# Patient Record
Sex: Female | Born: 1975 | Race: Black or African American | Hispanic: No | Smoking: Current every day smoker
Health system: Southern US, Community
[De-identification: ages and names within clinical notes are randomized; demographics above are authoritative.]

## PROBLEM LIST (undated history)

## (undated) ENCOUNTER — Ambulatory Visit: Admission: EM | Payer: MEDICAID

## (undated) DIAGNOSIS — K219 Gastro-esophageal reflux disease without esophagitis: Secondary | ICD-10-CM

## (undated) DIAGNOSIS — S060X0A Concussion without loss of consciousness, initial encounter: Secondary | ICD-10-CM

## (undated) DIAGNOSIS — N939 Abnormal uterine and vaginal bleeding, unspecified: Secondary | ICD-10-CM

## (undated) DIAGNOSIS — D3A8 Other benign neuroendocrine tumors: Secondary | ICD-10-CM

## (undated) DIAGNOSIS — F4321 Adjustment disorder with depressed mood: Secondary | ICD-10-CM

## (undated) DIAGNOSIS — L989 Disorder of the skin and subcutaneous tissue, unspecified: Secondary | ICD-10-CM

## (undated) HISTORY — PX: COLONOSCOPY: SHX174

## (undated) HISTORY — PX: HAND SURGERY: SHX662

## (undated) HISTORY — DX: Adjustment disorder with depressed mood: F43.21

## (undated) HISTORY — PX: FOOT SURGERY: SHX648

## (undated) HISTORY — PX: TUBAL LIGATION: SHX77

## (undated) HISTORY — DX: Concussion without loss of consciousness, initial encounter: S06.0X0A

## (undated) HISTORY — DX: Other benign neuroendocrine tumors: D3A.8

## (undated) HISTORY — PX: UPPER GASTROINTESTINAL ENDOSCOPY: SHX188

---

## 1997-07-24 ENCOUNTER — Other Ambulatory Visit: Admission: RE | Admit: 1997-07-24 | Discharge: 1997-07-24 | Payer: Self-pay

## 1999-02-11 ENCOUNTER — Inpatient Hospital Stay (HOSPITAL_COMMUNITY): Admission: AD | Admit: 1999-02-11 | Discharge: 1999-02-11 | Payer: Self-pay | Admitting: *Deleted

## 2001-10-22 ENCOUNTER — Inpatient Hospital Stay (HOSPITAL_COMMUNITY): Admission: AD | Admit: 2001-10-22 | Discharge: 2001-10-22 | Payer: Self-pay | Admitting: Obstetrics and Gynecology

## 2005-12-18 ENCOUNTER — Ambulatory Visit (HOSPITAL_COMMUNITY): Admission: RE | Admit: 2005-12-18 | Discharge: 2005-12-18 | Payer: Self-pay | Admitting: Obstetrics

## 2006-01-13 HISTORY — PX: FOOT SURGERY: SHX648

## 2006-02-14 ENCOUNTER — Inpatient Hospital Stay (HOSPITAL_COMMUNITY): Admission: AD | Admit: 2006-02-14 | Discharge: 2006-02-18 | Payer: Self-pay | Admitting: Obstetrics

## 2006-07-19 ENCOUNTER — Inpatient Hospital Stay (HOSPITAL_COMMUNITY): Admission: AD | Admit: 2006-07-19 | Discharge: 2006-07-21 | Payer: Self-pay | Admitting: Obstetrics

## 2006-07-20 ENCOUNTER — Encounter (INDEPENDENT_AMBULATORY_CARE_PROVIDER_SITE_OTHER): Payer: Self-pay | Admitting: Obstetrics

## 2007-02-21 ENCOUNTER — Emergency Department (HOSPITAL_COMMUNITY): Admission: EM | Admit: 2007-02-21 | Discharge: 2007-02-21 | Payer: Self-pay | Admitting: Family Medicine

## 2008-01-27 ENCOUNTER — Emergency Department (HOSPITAL_COMMUNITY): Admission: EM | Admit: 2008-01-27 | Discharge: 2008-01-28 | Payer: Self-pay | Admitting: Emergency Medicine

## 2009-02-04 ENCOUNTER — Emergency Department (HOSPITAL_COMMUNITY): Admission: EM | Admit: 2009-02-04 | Discharge: 2009-02-04 | Payer: Self-pay | Admitting: Emergency Medicine

## 2009-03-26 ENCOUNTER — Emergency Department (HOSPITAL_COMMUNITY): Admission: EM | Admit: 2009-03-26 | Discharge: 2009-03-26 | Payer: Self-pay | Admitting: Emergency Medicine

## 2010-04-29 LAB — URINALYSIS, ROUTINE W REFLEX MICROSCOPIC
Bilirubin Urine: NEGATIVE
Glucose, UA: NEGATIVE mg/dL
Ketones, ur: NEGATIVE mg/dL
Nitrite: NEGATIVE
Protein, ur: NEGATIVE mg/dL
Urobilinogen, UA: 0.2 mg/dL (ref 0.0–1.0)
pH: 6.5 (ref 5.0–8.0)

## 2010-04-29 LAB — COMPREHENSIVE METABOLIC PANEL
Alkaline Phosphatase: 52 U/L (ref 39–117)
CO2: 24 mEq/L (ref 19–32)
Chloride: 108 mEq/L (ref 96–112)
Creatinine, Ser: 0.51 mg/dL (ref 0.4–1.2)
Glucose, Bld: 114 mg/dL — ABNORMAL HIGH (ref 70–99)
Potassium: 4.2 mEq/L (ref 3.5–5.1)
Total Protein: 6.2 g/dL (ref 6.0–8.3)

## 2010-04-29 LAB — URINE MICROSCOPIC-ADD ON

## 2010-05-01 ENCOUNTER — Other Ambulatory Visit (HOSPITAL_COMMUNITY): Payer: Self-pay | Admitting: Obstetrics

## 2010-05-01 DIAGNOSIS — Z1231 Encounter for screening mammogram for malignant neoplasm of breast: Secondary | ICD-10-CM

## 2010-05-06 ENCOUNTER — Ambulatory Visit (HOSPITAL_COMMUNITY)
Admission: RE | Admit: 2010-05-06 | Discharge: 2010-05-06 | Disposition: A | Discharge status: Home / Self Care | Payer: Medicaid Other | Source: Ambulatory Visit | Attending: Obstetrics | Admitting: Obstetrics

## 2010-05-06 DIAGNOSIS — Z1231 Encounter for screening mammogram for malignant neoplasm of breast: Secondary | ICD-10-CM

## 2010-05-07 ENCOUNTER — Ambulatory Visit (HOSPITAL_COMMUNITY): Payer: Medicaid Other

## 2010-05-28 NOTE — Op Note (Signed)
[name], Vanessa Butler               ACCOUNT NO.:  [id number]   MEDICAL RECORD NO.:  [id number]          PATIENT TYPE:  INP   LOCATION:  9105                          FACILITY:  [facility]   PHYSICIAN:  Kathreen Cosier, M.D.DATE OF BIRTH:  Jul 06, 1975   DATE OF PROCEDURE:  DATE OF DISCHARGE:                               OPERATIVE REPORT   PREOPERATIVE DIAGNOSIS:  Multiparity.   PROCEDURE:  Postpartum tubal ligation.   Under general anesthesia, with the patient in the supine position,  abdomen prepped and draped.  Bladder entered with a straight catheter.  A midline subumbilical incision, a 1 inch line was made, and carried  down to the fascia.  Fascia cleaned, grasped with two Kochers in the  fascia, and the peritoneum opened with the Mayo scissors.  Left tube  grasped in the midportion with a Babcock clamp, and then 0 plain suture  placed in the mesosalpinx below the portion of the tube within the  clamp.  This was then tied.  Approximately 1 inch of tube transected.  Hemostasis satisfactory.  Procedure done in a similar fashion on the  other side.  Lap and sponge counts correct.  Abdomen closed in layers.  Peritoneum and fascia closed with continuous suture of 0 Dexon.  Skin  closed with subcuticular stitch of 4-0 Monocryl.  Blood loss minimal.  The patient tolerated the procedure well and was taken to the recovery  room in good condition.           ______________________________  Kathreen Cosier, M.D.     BAM/MEDQ  D:  07/20/2006  T:  07/20/2006  Job:  [id number]

## 2010-05-31 NOTE — Discharge Summary (Signed)
Vanessa, Butler               ACCOUNT NO.:  [id number]   MEDICAL RECORD NO.:  [id number]          PATIENT TYPE:  INP   LOCATION:  9305                          FACILITY:  [facility]   PHYSICIAN:  Kathreen Cosier, M.D.DATE OF BIRTH:  05/05/1975   DATE OF ADMISSION:  02/14/2006  DATE OF DISCHARGE:  02/18/2006                               DISCHARGE SUMMARY   HOSPITAL COURSE:  The patient is a 35 year old gravida 3, para 1-1-0-2,  [facility] July 31, 2006, who was treated for a urinary tract infection a week  prior to admission, but did not take her medication.  She was admitted  with a temperature of 100.7, right CVA tenderness, positive urine.  She  spiked up to 101.4 and she was on IV ampicillin and gentamicin.  By  February 5, she was afebrile and by February 6, she had been afebrile  for greater than 24 hours.  She was discharged home on February 18, 2006,  status post pyelonephritis.           ______________________________  Kathreen Cosier, M.D.     BAM/MEDQ  D:  03/04/2006  T:  03/04/2006  Job:  [id number]

## 2010-08-08 ENCOUNTER — Emergency Department (HOSPITAL_COMMUNITY)
Admission: EM | Admit: 2010-08-08 | Discharge: 2010-08-08 | Disposition: A | Discharge status: Home / Self Care | Payer: Medicaid Other | Attending: Emergency Medicine | Admitting: Emergency Medicine

## 2010-08-08 DIAGNOSIS — J3489 Other specified disorders of nose and nasal sinuses: Secondary | ICD-10-CM | POA: Insufficient documentation

## 2010-08-08 DIAGNOSIS — R197 Diarrhea, unspecified: Secondary | ICD-10-CM | POA: Insufficient documentation

## 2010-08-08 DIAGNOSIS — J029 Acute pharyngitis, unspecified: Secondary | ICD-10-CM | POA: Insufficient documentation

## 2010-10-29 LAB — CBC
Hemoglobin: 9.9 — ABNORMAL LOW
MCHC: 33.4
MCHC: 34
MCV: 103.6 — ABNORMAL HIGH
MCV: 103.8 — ABNORMAL HIGH
RBC: 2.86 — ABNORMAL LOW
RDW: 12.6
RDW: 12.6
WBC: 6.4
WBC: 9.9

## 2011-04-07 ENCOUNTER — Encounter (HOSPITAL_COMMUNITY): Payer: Self-pay

## 2011-04-07 ENCOUNTER — Emergency Department (INDEPENDENT_AMBULATORY_CARE_PROVIDER_SITE_OTHER)
Admission: EM | Admit: 2011-04-07 | Discharge: 2011-04-07 | Disposition: A | Discharge status: Home / Self Care | Payer: Medicaid Other | Source: Home / Self Care | Attending: Family Medicine | Admitting: Family Medicine

## 2011-04-07 DIAGNOSIS — J329 Chronic sinusitis, unspecified: Secondary | ICD-10-CM

## 2011-04-07 DIAGNOSIS — R51 Headache: Secondary | ICD-10-CM

## 2011-04-07 LAB — GLUCOSE, CAPILLARY: Glucose-Capillary: 86 mg/dL (ref 70–99)

## 2011-04-07 MED ORDER — FEXOFENADINE HCL 60 MG PO TABS: 60.0000 mg | ORAL_TABLET | Freq: Two times a day (BID) | ORAL | Status: DC

## 2011-04-07 MED ORDER — PREDNISONE 20 MG PO TABS: ORAL_TABLET | ORAL | Status: AC

## 2011-04-07 MED ORDER — AMOXICILLIN 500 MG PO CAPS: 500.0000 mg | ORAL_CAPSULE | Freq: Three times a day (TID) | ORAL | Status: AC

## 2011-04-07 MED ORDER — IBUPROFEN 600 MG PO TABS: 600.0000 mg | ORAL_TABLET | Freq: Four times a day (QID) | ORAL | Status: AC | PRN

## 2011-04-07 NOTE — ED Notes (Signed)
C/o 1 month duration of HA w pressure in teeth, worse when she bends over , R>L

## 2011-04-07 NOTE — Discharge Instructions (Signed)
My impression is that likely your headache is triggered by sinusitis. But there can be other reasons for headaches. Take the prescribed medications as instructed. Use nasal saline spray at least 3 times a day. Simply saline is sold over-the-counter. I recommend that you have your eyes checked for possible refraction problems as you may need eyeglasses. Go to the emergency department if worsening pain or new symptoms like problems with her vision, your balance, moving your extremities or vomiting. Otherwise if persistent headaches I recommend that you followup with a neurologist number provided above or with a primary care provider who can continue a further workup.

## 2011-04-09 NOTE — ED Provider Notes (Signed)
History     CSN: [id number]  Arrival date & time 04/07/11  1600   First MD Initiated Contact with Patient 04/07/11 1704      Chief Complaint  Patient presents with  . Headache    (Consider location/radiation/quality/duration/timing/severity/associated sxs/prior treatment) HPI Comments: 36 y/o smoker female here c/o headache periocular associated with photophobia but no nausea or vomiting. No visual changes that she can recognize. No dizziness or balance problems. Headaches recurrent for the last month. Symptoms associated with tooth pressure and she has had extensive dental work up recently. Denies taking antibiotics during dental work. Has had h/o seasonal allergies in the past. Currently nasal congestion and postnasal drip, no sore throat, no cough. Thinks she has a cold because of general malaise. Headache worse with bending over. Denies difficulties with movement, paresthesias, numbness or weakness. No galactorrhea. LMP: 04/04/11. Has PCP at [place].   History reviewed. No pertinent past medical history.  History reviewed. No pertinent past surgical history.  History reviewed. No pertinent family history.  History  Substance Use Topics  . Smoking status: Current Some Day Smoker  . Smokeless tobacco: Not on file  . Alcohol Use: No    OB History    Grav Para Term Preterm Abortions TAB SAB Ect Mult Living                  Review of Systems  Constitutional: Negative for fever, chills and appetite change.  HENT: Positive for congestion, dental problem and sinus pressure. Negative for ear pain, sore throat, trouble swallowing, neck pain and neck stiffness.   Cardiovascular: Negative for chest pain, palpitations and leg swelling.  Gastrointestinal: Negative for nausea, vomiting, abdominal pain and diarrhea.  Genitourinary: Negative for dysuria, frequency and flank pain.  Musculoskeletal: Negative for gait problem.  Skin: Negative for rash.  Neurological: Positive for  headaches. Negative for dizziness, tremors, seizures, syncope, facial asymmetry, speech difficulty, weakness and numbness.  Hematological: Negative for adenopathy.    Allergies  Review of patient's allergies indicates no known allergies.  Home Medications   Current Outpatient Rx  Name Route Sig Dispense Refill  . AMOXICILLIN 500 MG PO CAPS Oral Take 1 capsule (500 mg total) by mouth 3 (three) times daily. 21 capsule 0  . FEXOFENADINE HCL 60 MG PO TABS Oral Take 1 tablet (60 mg total) by mouth 2 (two) times daily. 60 tablet 0  . IBUPROFEN 600 MG PO TABS Oral Take 1 tablet (600 mg total) by mouth every 6 (six) hours as needed for pain. 20 tablet 0  . PREDNISONE 20 MG PO TABS  2 tabs po daily for 5 days 10 tablet no    BP 147/85  Pulse 76  Temp(Src) 98.7 F (37.1 C) (Oral)  Resp 10  SpO2 100%  LMP 04/04/2011  Physical Exam  Nursing note and vitals reviewed. Constitutional: She is oriented to person, place, and time. She appears well-developed and well-nourished. No distress.  HENT:  Head: Normocephalic and atraumatic.  Right Ear: External ear normal.  Left Ear: External ear normal.  Mouth/Throat: Oropharynx is clear and moist.       Nasal Congestion with erythema and swelling of nasal turbinates, clear rhinorrhea. Reported sinus pressure. Mild pharyngeal erythema, no exudates. No uvula deviation. No trismus. TM's normal.  Eyes: Conjunctivae and EOM are normal. Pupils are equal, round, and reactive to light.  Neck: Normal range of motion. Neck supple. No JVD present. No thyromegaly present.  Cardiovascular: Regular rhythm and normal heart sounds.  Pulmonary/Chest: Breath sounds normal.  Abdominal: Soft. There is no tenderness.  Lymphadenopathy:    She has no cervical adenopathy.  Neurological: She is alert and oriented to person, place, and time. She has normal strength and normal reflexes. No cranial nerve deficit or sensory deficit. She displays a negative Romberg sign.  Coordination and gait normal.       Visual fields normal by comparison  Skin: No rash noted.    ED Course  Procedures (including critical care time)   Labs Reviewed  GLUCOSE, CAPILLARY  LAB REPORT - SCANNED   No results found.   1. Rhinosinusitis   2. Headache       MDM  Impress rhino sinusitis. Afebrile. Likely headache related to sinusitis vs migraine although no prior h/o migraines reported. No history or findings suggestive of intracranial expansion process. Treated with prednisone, amoxicillin, allegra and ibuprofen. Asked to follow up with pcp for symptoms monitoring and possible screening for thyroid, anemia etc. Also asked to have vision checked. Neurology referral as needed if persistent symptoms. Asked to go to the ED if worsening or new symptoms. Random CBG normal here.         Sharin Grave, MD 04/09/11 1205

## 2011-05-12 ENCOUNTER — Encounter (HOSPITAL_COMMUNITY): Payer: Self-pay

## 2011-05-12 ENCOUNTER — Emergency Department (HOSPITAL_COMMUNITY)
Admission: EM | Admit: 2011-05-12 | Discharge: 2011-05-12 | Disposition: A | Discharge status: Home / Self Care | Payer: Medicaid Other | Source: Home / Self Care | Attending: Emergency Medicine | Admitting: Emergency Medicine

## 2011-05-12 DIAGNOSIS — L0201 Cutaneous abscess of face: Secondary | ICD-10-CM

## 2011-05-12 DIAGNOSIS — L03211 Cellulitis of face: Secondary | ICD-10-CM

## 2011-05-12 HISTORY — DX: Disorder of the skin and subcutaneous tissue, unspecified: L98.9

## 2011-05-12 MED ORDER — SULFAMETHOXAZOLE-TRIMETHOPRIM 800-160 MG PO TABS: 1.0000 | ORAL_TABLET | Freq: Two times a day (BID) | ORAL | Status: AC

## 2011-05-12 NOTE — Discharge Instructions (Signed)
Return here or follow up with your doctor in 2 days for a wound check. Finish the antibiotics even if you feel better. Continue the warm compresses. Start taking the motrin and 1 gram of tylenol with it as needed for pain. This is an effective combination for pain.t, as they both have tylenol in them and too much can hurt your liver.   Return sooner if you have a fever >100.4, if you get worse, or any other concerns.   Go to www.goodrx.com to look up your medications. This will give you a list of where you can find your prescriptions at the most affordable prices.

## 2011-05-12 NOTE — ED Provider Notes (Signed)
History     CSN: [id number]  Arrival date & time 05/12/11  4098   First MD Initiated Contact with Patient 05/12/11 1003      Chief Complaint  Patient presents with  . Skin Ulcer    (Consider location/radiation/quality/duration/timing/severity/associated sxs/prior treatment) HPI Comments: Patient reports a painful mass gradually increasing size on her right face starting several days ago. States it started off as a small pimple. Patient has been applying alcohol and peroxide, but the skin peeled off. No blisters. No other facial rash. No nausea, vomiting, fevers. No drainage from the area. No dental pain. No other facial swelling.  patient is not diabetic.  ROS as noted in HPI. All other ROS negative.   Patient is a 36 y.o. female presenting with abscess. The history is provided by the patient. No language interpreter was used.  Abscess  This is a new problem. The onset was gradual. The problem has been gradually worsening. The abscess is present on the face. The abscess is characterized by redness, peeling and painfulness. It is unknown what she was exposed to. Pertinent negatives include no fever. There were no sick contacts. She has received no recent medical care.    Past Medical History  Diagnosis Date  . Skin disorder     Past Surgical History  Procedure Date  . Foot surgery   . Tubal ligation     History reviewed. No pertinent family history.  History  Substance Use Topics  . Smoking status: Current Some Day Smoker  . Smokeless tobacco: Not on file  . Alcohol Use: No    OB History    Grav Para Term Preterm Abortions TAB SAB Ect Mult Living                  Review of Systems  Constitutional: Negative for fever.    Allergies  Review of patient's allergies indicates no known allergies.  Home Medications   Current Outpatient Rx  Name Route Sig Dispense Refill  . BACITRACIN 500 UNIT/GM EX OINT Topical Apply 1 application topically 2 (two) times daily.      Marland Kitchen FEXOFENADINE HCL 60 MG PO TABS Oral Take 1 tablet (60 mg total) by mouth 2 (two) times daily. 60 tablet 0    BP 113/73  Pulse 72  Temp(Src) 98.5 F (36.9 C) (Oral)  Resp 12  SpO2 100%  LMP 05/02/2011  Physical Exam  Nursing note and vitals reviewed. Constitutional: She is oriented to person, place, and time. She appears well-developed and well-nourished. No distress.  HENT:  Head: Normocephalic and atraumatic.    Mouth/Throat: Uvula is midline, oropharynx is clear and moist and mucous membranes are normal. Abnormal dentition. Dental caries present. No dental abscesses.       1.0 x 0.5 cm tender area with central fluctuance. No evidence of buccal abscess  Eyes: Conjunctivae and EOM are normal.  Neck: Normal range of motion.  Cardiovascular: Normal rate.   Pulmonary/Chest: Effort normal.  Abdominal: She exhibits no distension.  Musculoskeletal: Normal range of motion.  Neurological: She is alert and oriented to person, place, and time.  Skin: Skin is warm and dry.  Psychiatric: She has a normal mood and affect. Her behavior is normal. Judgment and thought content normal.    ED Course  INCISION AND DRAINAGE Performed by: Luiz Blare Authorized by: Luiz Blare Consent: Verbal consent obtained. Risks and benefits: risks, benefits and alternatives were discussed Consent given by: patient Patient understanding: patient states understanding of  the procedure being performed Patient consent: the patient's understanding of the procedure matches consent given Required items: required blood products, implants, devices, and special equipment available Patient identity confirmed: verbally with patient Time out: Immediately prior to procedure a "time out" was called to verify the correct patient, procedure, equipment, support staff and site/side marked as required. Type: abscess Body area: head/neck Location details: face Patient sedated: no Needle gauge:  18 Incision type: single straight Complexity: simple Drainage: purulent Drainage amount: moderate Wound treatment: wound left open Patient tolerance: Patient tolerated the procedure well with no immediate complications. Comments: Bacitracin and sterile dressing applied   (including critical care time)  Labs Reviewed - No data to display No results found.   1. Facial abscess       MDM  Drain facial abscess. Sending home with Bactrim, warm compresses. Tylenol ibuprofen as needed for pain Advised patient to return here or follow up with Dr. in 2 days if no significant improvement, fevers, or any other concerns. Patient agrees with plan.  Luiz Blare, MD 05/12/11 1324

## 2011-05-21 LAB — PROCEDURE REPORT - SCANNED: Pap: NEGATIVE

## 2012-08-24 ENCOUNTER — Inpatient Hospital Stay (HOSPITAL_COMMUNITY): Payer: Medicaid Other

## 2012-11-11 ENCOUNTER — Telehealth: Payer: Self-pay | Admitting: *Deleted

## 2012-11-11 NOTE — Telephone Encounter (Signed)
Pt complains of severe pain in hands due to warts, hurting since 04/2012, asked for Dr. Ralene Cork to refer to hand specialist.  Pt called previously in 08/2012, and was given [facility], Dr Merlyn Lot and Dr Teressa Senter' s phone numbers.  I gave her the names and phone numbers again, and instructed her to have her primary physician refer.

## 2012-11-23 ENCOUNTER — Other Ambulatory Visit (HOSPITAL_COMMUNITY)
Admission: RE | Admit: 2012-11-23 | Discharge: 2012-11-23 | Disposition: A | Discharge status: Home / Self Care | Payer: Medicaid Other | Source: Ambulatory Visit | Attending: Family Medicine | Admitting: Family Medicine

## 2012-11-23 ENCOUNTER — Other Ambulatory Visit: Payer: Self-pay | Admitting: Family Medicine

## 2012-11-23 DIAGNOSIS — N760 Acute vaginitis: Secondary | ICD-10-CM | POA: Insufficient documentation

## 2012-11-23 DIAGNOSIS — Z113 Encounter for screening for infections with a predominantly sexual mode of transmission: Secondary | ICD-10-CM | POA: Insufficient documentation

## 2013-07-21 ENCOUNTER — Other Ambulatory Visit (HOSPITAL_COMMUNITY): Payer: Self-pay | Admitting: Hematology and Oncology

## 2013-12-24 ENCOUNTER — Emergency Department (HOSPITAL_COMMUNITY)
Admission: EM | Admit: 2013-12-24 | Discharge: 2013-12-24 | Disposition: A | Discharge status: Home / Self Care | Payer: Medicaid Other | Attending: Emergency Medicine | Admitting: Emergency Medicine

## 2013-12-24 ENCOUNTER — Encounter (HOSPITAL_COMMUNITY): Payer: Self-pay | Admitting: *Deleted

## 2013-12-24 DIAGNOSIS — R21 Rash and other nonspecific skin eruption: Secondary | ICD-10-CM | POA: Diagnosis not present

## 2013-12-24 DIAGNOSIS — Z792 Long term (current) use of antibiotics: Secondary | ICD-10-CM | POA: Insufficient documentation

## 2013-12-24 DIAGNOSIS — L989 Disorder of the skin and subcutaneous tissue, unspecified: Secondary | ICD-10-CM | POA: Diagnosis not present

## 2013-12-24 DIAGNOSIS — Z720 Tobacco use: Secondary | ICD-10-CM | POA: Diagnosis not present

## 2013-12-24 MED ORDER — PREDNISONE 20 MG PO TABS: ORAL_TABLET | ORAL | Status: DC

## 2013-12-24 MED ORDER — PREDNISONE 20 MG PO TABS
60.0000 mg | ORAL_TABLET | Freq: Once | ORAL | Status: AC
  Administered 2013-12-24: 60 mg via ORAL
  Filled 2013-12-24: qty 3

## 2013-12-24 MED ORDER — DIPHENHYDRAMINE HCL 25 MG PO CAPS
25.0000 mg | ORAL_CAPSULE | Freq: Four times a day (QID) | ORAL | Status: DC | PRN
  Administered 2013-12-24: 25 mg via ORAL
  Filled 2013-12-24: qty 1

## 2013-12-24 NOTE — Discharge Instructions (Signed)
Return to the emergency room with worsening of symptoms, new symptoms or with symptoms that are concerning, especially fevers, face, neck, lips, mouth swelling. Tenderness or swelling under tongue and of tongue as well as chest pain, shortness of breath, sensation of throat tightness or difficulty breathing. Take prednisone as prescribed as well as Benadryl or Claritin, Zyrtec, Allegra. Benadryl can be sedating sig do not drive or operate machinery drink alcohol or take any other sedating meds while taking Benadryl. Follow-up with your dermatologist as scheduled in 5 days. Use Vaseline to moisturize lesions. Do not scratch.   Contact Dermatitis Contact dermatitis is a reaction to certain substances that touch the skin. Contact dermatitis can be either irritant contact dermatitis or allergic contact dermatitis. Irritant contact dermatitis does not require previous exposure to the substance for a reaction to occur.Allergic contact dermatitis only occurs if you have been exposed to the substance before. Upon a repeat exposure, your body reacts to the substance.  CAUSES  Many substances can cause contact dermatitis. Irritant dermatitis is most commonly caused by repeated exposure to mildly irritating substances, such as:  Makeup.  Soaps.  Detergents.  Bleaches.  Acids.  Metal salts, such as nickel. Allergic contact dermatitis is most commonly caused by exposure to:  Poisonous plants.  Chemicals (deodorants, shampoos).  Jewelry.  Latex.  Neomycin in triple antibiotic cream.  Preservatives in products, including clothing. SYMPTOMS  The area of skin that is exposed may develop:  Dryness or flaking.  Redness.  Cracks.  Itching.  Pain or a burning sensation.  Blisters. With allergic contact dermatitis, there may also be swelling in areas such as the eyelids, mouth, or genitals.  DIAGNOSIS  Your caregiver can usually tell what the problem is by doing a physical exam. In  cases where the cause is uncertain and an allergic contact dermatitis is suspected, a patch skin test may be performed to help determine the cause of your dermatitis. TREATMENT Treatment includes protecting the skin from further contact with the irritating substance by avoiding that substance if possible. Barrier creams, powders, and gloves may be helpful. Your caregiver may also recommend:  Steroid creams or ointments applied 2 times daily. For best results, soak the rash area in cool water for 20 minutes. Then apply the medicine. Cover the area with a plastic wrap. You can store the steroid cream in the refrigerator for a "chilly" effect on your rash. That may decrease itching. Oral steroid medicines may be needed in more severe cases.  Antibiotics or antibacterial ointments if a skin infection is present.  Antihistamine lotion or an antihistamine taken by mouth to ease itching.  Lubricants to keep moisture in your skin.  Burow's solution to reduce redness and soreness or to dry a weeping rash. Mix one packet or tablet of solution in 2 cups cool water. Dip a clean washcloth in the mixture, wring it out a bit, and put it on the affected area. Leave the cloth in place for 30 minutes. Do this as often as possible throughout the day.  Taking several cornstarch or baking soda baths daily if the area is too large to cover with a washcloth. Harsh chemicals, such as alkalis or acids, can cause skin damage that is like a burn. You should flush your skin for 15 to 20 minutes with cold water after such an exposure. You should also seek immediate medical care after exposure. Bandages (dressings), antibiotics, and pain medicine may be needed for severely irritated skin.  HOME CARE INSTRUCTIONS  Avoid the substance that caused your reaction.  Keep the area of skin that is affected away from hot water, soap, sunlight, chemicals, acidic substances, or anything else that would irritate your skin.  Do not  scratch the rash. Scratching may cause the rash to become infected.  You may take cool baths to help stop the itching.  Only take over-the-counter or prescription medicines as directed by your caregiver.  See your caregiver for follow-up care as directed to make sure your skin is healing properly. SEEK MEDICAL CARE IF:   Your condition is not better after 3 days of treatment.  You seem to be getting worse.  You see signs of infection such as swelling, tenderness, redness, soreness, or warmth in the affected area.  You have any problems related to your medicines. Document Released: 12/28/1999 Document Revised: 03/24/2011 Document Reviewed: 06/04/2010 [facility] Patient Information 2015 [place], [place]. This information is not intended to replace advice given to you by your health care provider. Make sure you discuss any questions you have with your health care provider.

## 2013-12-24 NOTE — ED Provider Notes (Signed)
CSN: [id number]     Arrival date & time 12/24/13  7564 History   First MD Initiated Contact with Patient 12/24/13 0759     Chief Complaint  Patient presents with  . Rash     (Consider location/radiation/quality/duration/timing/severity/associated sxs/prior Treatment) HPI  Vanessa Butler is a 38 y.o. 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 Patient presenting with a raised cutaneous rash on left shoulder and is now appeared on right forearm and antecubital fossa scattered lesions on legs. Lesions are pruritic without discharge. Patient denies fevers, chills, nausea, vomiting, abdominal pain, chest pain, shortness of breath, throat tightness or difficulty breathing. No other rashes. She denies any other new medications or detergents or being outside.   Past Medical History  Diagnosis Date  . Skin disorder    Past Surgical History  Procedure Laterality Date  . Foot surgery    . Tubal ligation     History reviewed. No pertinent family history. History  Substance Use Topics  . Smoking status: Current Some Day Smoker  . Smokeless tobacco: Not on file  . Alcohol Use: No   OB History    No data available     Review of Systems  Constitutional: Negative for fever and chills.  Gastrointestinal: Negative for nausea and vomiting.  Skin: Positive for rash.      Allergies  Review of patient's allergies indicates no known allergies.  Home Medications   Prior to Admission medications   Medication Sig Start Date End Date Taking? Authorizing Provider  bacitracin 500 UNIT/GM ointment Apply 1 application topically 2 (two) times daily.    Historical Provider, MD  predniSONE (DELTASONE) 20 MG tablet 2 tabs po daily x 4 days 12/24/13   Pura Spice, PA-C   BP 116/62 mmHg  Pulse 79  Temp(Src) 98.5 F (36.9 C) (Oral)  Resp 16  Ht 5' (1.524 m)  Wt 130 lb (58.968 kg)  BMI 25.39 kg/m2  SpO2 98%   LMP 12/12/2013 Physical Exam  Constitutional: She appears well-developed and well-nourished. No distress.  HENT:  Head: Normocephalic and atraumatic.  No facial swelling or lip swelling. No neck swelling or masses. No trismus or uvula deviation. No oral lesions. No tongue swelling or swelling under the tongue or tenderness.  Eyes: Conjunctivae and EOM are normal. Right eye exhibits no discharge. Left eye exhibits no discharge.  Cardiovascular: Normal rate, regular rhythm and normal heart sounds.   Pulmonary/Chest: Effort normal and breath sounds normal. No respiratory distress. She has no wheezes.  Abdominal: Soft. Bowel sounds are normal. She exhibits no distension. There is no tenderness.  Neurological: She is alert. She exhibits normal muscle tone. Coordination normal.  Skin: Skin is warm and dry. She is not diaphoretic.  Pt with 2-3cm excoriated irregularly circular lesion with mild erythema. Similar lesions in right antecubital fossa and on right upper chest. No drainage and only mild erythema. Lesions do not look infected and do blanch. Patient with cutaneous warts to side of multiple fingers in bilateral hands.  Nursing note and vitals reviewed.   ED Course  Procedures (including critical care time) Labs Review Labs Reviewed - No data to display  Imaging Review No results found.   EKG Interpretation None      MDM   Final diagnoses:  Rash, skin   Patient with diffuse pruritic rash without know cause. Patient is being seen by dermatologist and has been given  square acid. She noted that the rash appeared after she started taking the medicines. No fevers. No difficulty breathing, throat tightness. VSS. No mucous membrane involvement. No oral, tongue, facial swelling. No tenderness under tongue. I doubt Stevens-Johnson syndrome. Tx with steriods, antihistamines and symptomatic treatment. Follow up her dermatologist in 5 days as scheduled.   Discussed return precautions with  patient. Discussed all results and patient verbalizes understanding and agrees with plan.    Pura Spice, PA-C 12/24/13 [place], MD 12/24/13 [phone]

## 2013-12-24 NOTE — ED Notes (Signed)
Pt reports rash started 3 days after usinf a new med for warts . Pt brought med to ED ( ACID in ACETONE 0.4% solution).

## 2013-12-24 NOTE — ED Notes (Signed)
Declined W/C at D/C and was escorted to lobby by RN. 

## 2014-12-13 ENCOUNTER — Telehealth: Payer: Self-pay | Admitting: *Deleted

## 2014-12-13 NOTE — Telephone Encounter (Signed)
Pt states she would like the name of a Copy.  Left message informing pt that Dr. Blenda Mounts liked Dr. Theodis Sato at [phone].

## 2014-12-20 ENCOUNTER — Telehealth: Payer: Self-pay | Admitting: *Deleted

## 2014-12-20 NOTE — Telephone Encounter (Addendum)
Pt states Dr. Rhona Raider office is not answering the phone.  Unable to leave a message on pt's home phone, and the mobile is a disconnected number. I will refer to [facility] group.  I told pt that Raliegh Ip or [place] maybe able to help.

## 2015-02-18 ENCOUNTER — Emergency Department (HOSPITAL_COMMUNITY)
Admission: EM | Admit: 2015-02-18 | Discharge: 2015-02-18 | Disposition: A | Discharge status: Home / Self Care | Payer: Medicaid Other | Attending: Emergency Medicine | Admitting: Emergency Medicine

## 2015-02-18 ENCOUNTER — Encounter (HOSPITAL_COMMUNITY): Payer: Self-pay | Admitting: Physical Medicine and Rehabilitation

## 2015-02-18 DIAGNOSIS — Z87440 Personal history of urinary (tract) infections: Secondary | ICD-10-CM | POA: Insufficient documentation

## 2015-02-18 DIAGNOSIS — Z7952 Long term (current) use of systemic steroids: Secondary | ICD-10-CM | POA: Insufficient documentation

## 2015-02-18 DIAGNOSIS — R300 Dysuria: Secondary | ICD-10-CM | POA: Diagnosis present

## 2015-02-18 DIAGNOSIS — F17210 Nicotine dependence, cigarettes, uncomplicated: Secondary | ICD-10-CM | POA: Diagnosis not present

## 2015-02-18 DIAGNOSIS — Z872 Personal history of diseases of the skin and subcutaneous tissue: Secondary | ICD-10-CM | POA: Diagnosis not present

## 2015-02-18 DIAGNOSIS — N390 Urinary tract infection, site not specified: Secondary | ICD-10-CM | POA: Diagnosis not present

## 2015-02-18 DIAGNOSIS — Z792 Long term (current) use of antibiotics: Secondary | ICD-10-CM | POA: Diagnosis not present

## 2015-02-18 LAB — URINALYSIS, ROUTINE W REFLEX MICROSCOPIC
Bilirubin Urine: NEGATIVE
Glucose, UA: NEGATIVE mg/dL
Ketones, ur: NEGATIVE mg/dL
NITRITE: POSITIVE — AB
PROTEIN: 100 mg/dL — AB
SPECIFIC GRAVITY, URINE: 1.029 (ref 1.005–1.030)
pH: 5.5 (ref 5.0–8.0)

## 2015-02-18 LAB — URINE MICROSCOPIC-ADD ON

## 2015-02-18 MED ORDER — PHENAZOPYRIDINE HCL 200 MG PO TABS: 200.0000 mg | ORAL_TABLET | Freq: Three times a day (TID) | ORAL | Status: DC

## 2015-02-18 MED ORDER — NITROFURANTOIN MONOHYD MACRO 100 MG PO CAPS: 100.0000 mg | ORAL_CAPSULE | Freq: Two times a day (BID) | ORAL | Status: DC

## 2015-02-18 NOTE — ED Provider Notes (Signed)
CSN: [id number]     Arrival date & time 02/18/15  [id number] History  By signing my name below, I, Julien Nordmann, attest that this documentation has been prepared under the direction and in the presence of Kallee Nam, PA-C. Electronically Signed: Julien Nordmann, ED Scribe. 02/18/2015. 11:44 AM.     Chief Complaint  Patient presents with  . Dysuria  . Hematuria      The history is provided by the patient. No language interpreter was used.   HPI Comments: Vanessa Butler is a 40 y.o. female who presents to the Emergency Department complaining of constant, moderate, gradual worsening dysuria with associated hematuria and bladder pressure, urinary frequency and urgency onset one week ago. She reports having a UTI when she was younger and does not have a hx of kidney stones. She has been taking OTC medication to alleviate her symptoms with no relief. Denies fevers, chills, body aches, vaginal discharge, vaginal bleeding, blood in stool, diarrhea, back pain.  Past Medical History  Diagnosis Date  . Skin disorder    Past Surgical History  Procedure Laterality Date  . Foot surgery    . Tubal ligation     No family history on file. Social History  Substance Use Topics  . Smoking status: Current Some Day Smoker    Types: Cigarettes  . Smokeless tobacco: None  . Alcohol Use: No   OB History    No data available     Review of Systems  Constitutional: Negative for fever and chills.  Gastrointestinal: Positive for abdominal pain (lower abdominal pressure). Negative for diarrhea and blood in stool.  Genitourinary: Positive for dysuria, urgency, frequency and hematuria. Negative for vaginal bleeding and vaginal discharge.  Musculoskeletal: Negative for myalgias and back pain.  Allergic/Immunologic: Negative for immunocompromised state.      Allergies  Review of patient's allergies indicates no known allergies.  Home Medications   Prior to Admission medications   Medication Sig Start  Date End Date Taking? Authorizing Provider  bacitracin 500 UNIT/GM ointment Apply 1 application topically 2 (two) times daily.    Historical Provider, MD  predniSONE (DELTASONE) 20 MG tablet 2 tabs po daily x 4 days 12/24/13   Al Corpus, PA-C   Triage vitals: BP 134/75 mmHg  Pulse 90  Temp(Src) 97.8 F (36.6 C) (Oral)  Resp 18  SpO2 98% Physical Exam  Constitutional: She appears well-developed and well-nourished. No distress.  HENT:  Head: Normocephalic and atraumatic.  Neck: Neck supple.  Cardiovascular: Normal rate and regular rhythm.   Pulmonary/Chest: Effort normal and breath sounds normal. No respiratory distress. She has no wheezes. She has no rales.  Abdominal: Soft. She exhibits no distension. There is no tenderness. There is no rebound and no guarding.  No CVA tenderness  Neurological: She is alert.  Skin: She is not diaphoretic.  Psychiatric: She has a normal mood and affect. Her behavior is normal.  Nursing note and vitals reviewed.   ED Course  Procedures  DIAGNOSTIC STUDIES: Oxygen Saturation is 98% on RA, normal by my interpretation.  COORDINATION OF CARE:  11:43 AM Discussed treatment plan with pt at bedside and pt agreed to plan.  Labs Review Labs Reviewed  URINALYSIS, ROUTINE W REFLEX MICROSCOPIC (NOT AT [facility]) - Abnormal; Notable for the following:    Color, Urine ORANGE (*)    APPearance TURBID (*)    Hgb urine dipstick LARGE (*)    Protein, ur 100 (*)    Nitrite POSITIVE (*)  Leukocytes, UA LARGE (*)    All other components within normal limits  URINE MICROSCOPIC-ADD ON - Abnormal; Notable for the following:    Squamous Epithelial / LPF 6-30 (*)    Bacteria, UA MANY (*)    All other components within normal limits    Imaging Review No results found. I have personally reviewed and evaluated these images and lab results as part of my medical decision-making.   EKG Interpretation None      MDM   Final diagnoses:  UTI (lower urinary  tract infection)    Afebrile, nontoxic patient with simple UTI.  Denies other symptoms.   D/C home with macrobid, pyridium.  PCP follow up.  Discussed result, findings, treatment, and follow up  with patient.  Pt given return precautions.  Pt verbalizes understanding and agrees with plan.       Clayton Bibles, PA-C 02/18/15 1239  Merrily Pew, MD 02/18/15 [phone]

## 2015-02-18 NOTE — ED Notes (Signed)
Pt reports dysuria and hematuria. Ongoing x1 week. No relief with OTC medications.

## 2015-02-18 NOTE — Discharge Instructions (Signed)
Read the information below.  Use the prescribed medication as directed.  Please discuss all new medications with your pharmacist.  You may return to the Emergency Department at any time for worsening condition or any new symptoms that concern you.    If you develop high fevers, worsening abdominal pain, uncontrolled vomiting, or are unable to tolerate fluids by mouth, return to the ER for a recheck.     Urinary Tract Infection A urinary tract infection (UTI) can occur any place along the urinary tract. The tract includes the kidneys, ureters, bladder, and urethra. A type of germ called bacteria often causes a UTI. UTIs are often helped with antibiotic medicine.  HOME CARE   If given, take antibiotics as told by your doctor. Finish them even if you start to feel better.  Drink enough fluids to keep your pee (urine) clear or pale yellow.  Avoid tea, drinks with caffeine, and bubbly (carbonated) drinks.  Pee often. Avoid holding your pee in for a long time.  Pee before and after having sex (intercourse).  Wipe from front to back after you poop (bowel movement) if you are a woman. Use each tissue only once. GET HELP RIGHT AWAY IF:   You have back pain.  You have lower belly (abdominal) pain.  You have chills.  You feel sick to your stomach (nauseous).  You throw up (vomit).  Your burning or discomfort with peeing does not go away.  You have a fever.  Your symptoms are not better in 3 days. MAKE SURE YOU:   Understand these instructions.  Will watch your condition.  Will get help right away if you are not doing well or get worse.   This information is not intended to replace advice given to you by your health care provider. Make sure you discuss any questions you have with your health care provider.   Document Released: 06/18/2007 Document Revised: 01/20/2014 Document Reviewed: 07/31/2011 Elsevier Interactive Patient Education [employer].

## 2015-02-18 NOTE — ED Notes (Signed)
Declined W/C at D/C and was escorted to lobby by RN. 

## 2015-05-01 ENCOUNTER — Other Ambulatory Visit (HOSPITAL_COMMUNITY)
Admission: RE | Admit: 2015-05-01 | Discharge: 2015-05-01 | Disposition: A | Discharge status: Home / Self Care | Payer: Medicaid Other | Source: Ambulatory Visit | Attending: Family Medicine | Admitting: Family Medicine

## 2015-05-01 ENCOUNTER — Other Ambulatory Visit: Payer: Self-pay | Admitting: Family Medicine

## 2015-05-01 DIAGNOSIS — Z01419 Encounter for gynecological examination (general) (routine) without abnormal findings: Secondary | ICD-10-CM | POA: Insufficient documentation

## 2015-05-01 DIAGNOSIS — Z1151 Encounter for screening for human papillomavirus (HPV): Secondary | ICD-10-CM | POA: Insufficient documentation

## 2015-05-02 LAB — CYTOLOGY - PAP

## 2015-12-13 ENCOUNTER — Encounter (HOSPITAL_COMMUNITY): Payer: Self-pay | Admitting: *Deleted

## 2015-12-13 ENCOUNTER — Emergency Department (HOSPITAL_COMMUNITY)
Admission: EM | Admit: 2015-12-13 | Discharge: 2015-12-13 | Disposition: A | Discharge status: Home / Self Care | Payer: Medicaid Other | Attending: Emergency Medicine | Admitting: Emergency Medicine

## 2015-12-13 DIAGNOSIS — J029 Acute pharyngitis, unspecified: Secondary | ICD-10-CM

## 2015-12-13 DIAGNOSIS — R200 Anesthesia of skin: Secondary | ICD-10-CM | POA: Diagnosis not present

## 2015-12-13 DIAGNOSIS — Z79899 Other long term (current) drug therapy: Secondary | ICD-10-CM | POA: Diagnosis not present

## 2015-12-13 DIAGNOSIS — F17210 Nicotine dependence, cigarettes, uncomplicated: Secondary | ICD-10-CM | POA: Insufficient documentation

## 2015-12-13 DIAGNOSIS — K5900 Constipation, unspecified: Secondary | ICD-10-CM

## 2015-12-13 LAB — RAPID STREP SCREEN (MED CTR MEBANE ONLY): STREPTOCOCCUS, GROUP A SCREEN (DIRECT): NEGATIVE

## 2015-12-13 NOTE — ED Notes (Signed)
Discharged vitals in and pt getting dressed at this time.

## 2015-12-13 NOTE — ED Notes (Signed)
ED Provider at bedside. 

## 2015-12-13 NOTE — ED Triage Notes (Signed)
Pt presents with multiple complaints.  Initial complaint was that it hurt her tongue, throat and esophagus to swallow  (sharp pain) x 1 week, but that her throat is not sore.  Throat red with white patches.  She then stated constipation x 1 week but that she took a laxative and had a bm this am.  Denies abd pain.  Pt also states R hand numbness for several months, though no pain.

## 2015-12-13 NOTE — ED Provider Notes (Signed)
Vanessa Butler   CSN: [id number] Arrival date & time: 12/13/15  1620     History   Chief Complaint Chief Complaint  Patient presents with  . Sore Throat  . Constipation    HPI Vanessa Butler is a 40 y.o. female.  HPI  40 year old female presents with chief complaint of sore throat and constipation. Has had sore throat for about one week. Hurts in her throat as well as into her neck. Not getting any better or worse. No fevers. A little bit of cough. No shortness of breath. Her tongue feels "raw". No lesions noted. Has tried ibuprofen with no relief. Also the point of constipation. Typically goes once or twice a day but over the last 1 week has only gone a small amount this morning after taking ex-lax. She feels like she needs to go but can't. Still passing gas. No abdominal pain or distention. No nausea or vomiting. Also endorses 2 month history of right hand numbness. States it is her entire front and back hand including all fingers. This has been chronic and constant for over 2 months since she started a new job. No weakness. No other numbness.  Past Medical History:  Diagnosis Date  . Skin disorder     There are no active problems to display for this patient.   Past Surgical History:  Procedure Laterality Date  . FOOT SURGERY    . HAND SURGERY Left   . TUBAL LIGATION      OB History    No data available       Home Medications    Prior to Admission medications   Medication Sig Start Date End Date Taking? Authorizing Provider  bacitracin 500 UNIT/GM ointment Apply 1 application topically 2 (two) times daily.    Historical Provider, MD  nitrofurantoin, macrocrystal-monohydrate, (MACROBID) 100 MG capsule Take 1 capsule (100 mg total) by mouth 2 (two) times daily. 02/18/15   Clayton Bibles, PA-C  phenazopyridine (PYRIDIUM) 200 MG tablet Take 1 tablet (200 mg total) by mouth 3 (three) times daily. 02/18/15   Clayton Bibles, PA-C  predniSONE (DELTASONE) 20 MG  tablet 2 tabs po daily x 4 days 12/24/13   Al Corpus, PA-C    Family History No family history on file.  Social History Social History  Substance Use Topics  . Smoking status: Current Some Day Smoker    Packs/day: 1.00    Types: Cigarettes  . Smokeless tobacco: Never Used  . Alcohol use No     Allergies   Patient has no known allergies.   Review of Systems Review of Systems  Constitutional: Negative for fever.  HENT: Positive for sore throat. Negative for voice change.   Respiratory: Positive for cough.   Gastrointestinal: Negative for abdominal distention, abdominal pain, nausea and vomiting.  Neurological: Positive for numbness. Negative for weakness.  All other systems reviewed and are negative.    Physical Exam Updated Vital Signs BP 129/70 (BP Location: Left Arm)   Pulse 90   Temp 98 F (36.7 C) (Oral)   Resp 24   LMP 12/11/2015   SpO2 100%   Physical Exam  Constitutional: She is oriented to person, place, and time. She appears well-developed and well-nourished. No distress.  HENT:  Head: Normocephalic and atraumatic.  Right Ear: External ear normal.  Left Ear: External ear normal.  Nose: Nose normal.  Mouth/Throat: Oropharynx is clear and moist. No oropharyngeal exudate.  Eyes: Right eye exhibits no discharge. Left eye exhibits no  discharge.  Neck: Normal range of motion. Neck supple. No spinous process tenderness and no muscular tenderness present. Normal range of motion present.  Cardiovascular: Normal rate, regular rhythm and normal heart sounds.   Pulmonary/Chest: Effort normal and breath sounds normal. No stridor. She has no wheezes.  Abdominal: Soft. She exhibits no distension. There is no tenderness.  Lymphadenopathy:    She has no cervical adenopathy.  Neurological: She is alert and oriented to person, place, and time.  Normal gross sensation to hand, normal strength  Skin: Skin is warm and dry. She is not diaphoretic.  Nursing Butler and  vitals reviewed.    ED Treatments / Results  Labs (all labs ordered are listed, but only abnormal results are displayed) Labs Reviewed  RAPID STREP SCREEN (NOT AT [facility])  CULTURE, GROUP A STREP [facility])    EKG  EKG Interpretation None       Radiology No results found.  Procedures Procedures (including critical care time)  Medications Ordered in ED Medications - No data to display   Initial Impression / Assessment and Plan / ED Course  I have reviewed the triage vital signs and the nursing notes.  Pertinent labs & imaging results that were available during my care of the patient were reviewed by me and considered in my medical decision making (see chart for details).  Clinical Course     Patient with multiple variable complaints. Her throat is probably a viral pharyngitis. No neck stiffness, decreased range of motion, or tenderness. No evidence of PTA. No thrush. Overall well appearing. As for her constipation I offered rectal exam in case she has an impaction but she declines. She would rather try enemas at home. She has follow-up with her doctor in about 4 days. Encouraged to keep this. Discussed using Muro lax, stool softeners, etc. at home. Discussed over-the-counter remedies for sore throat. No signs of obvious acute bacterial infection. Highly doubt deep space infection. As for her hand numbness there is no current numbness on my exam and has normal strength. Follow-up with PCP for this chronic issue.  Final Clinical Impressions(s) / ED Diagnoses   Final diagnoses:  Viral pharyngitis  Constipation, unspecified constipation type  Numbness of right hand    New Prescriptions New Prescriptions   No medications on file     Sherwood Gambler, MD 12/13/15 1831

## 2015-12-16 LAB — CULTURE, GROUP A STREP (THRC)

## 2016-02-27 ENCOUNTER — Encounter: Payer: Self-pay | Admitting: Family Medicine

## 2016-02-27 ENCOUNTER — Ambulatory Visit (INDEPENDENT_AMBULATORY_CARE_PROVIDER_SITE_OTHER): Payer: Medicaid Other | Admitting: Family Medicine

## 2016-02-27 VITALS — BP 118/60 | HR 84 | Temp 98.5°F | Resp 16 | Ht 60.0 in | Wt 130.0 lb

## 2016-02-27 DIAGNOSIS — Z114 Encounter for screening for human immunodeficiency virus [HIV]: Secondary | ICD-10-CM | POA: Diagnosis not present

## 2016-02-27 DIAGNOSIS — B079 Viral wart, unspecified: Secondary | ICD-10-CM | POA: Insufficient documentation

## 2016-02-27 DIAGNOSIS — N939 Abnormal uterine and vaginal bleeding, unspecified: Secondary | ICD-10-CM

## 2016-02-27 DIAGNOSIS — Z23 Encounter for immunization: Secondary | ICD-10-CM

## 2016-02-27 DIAGNOSIS — M79642 Pain in left hand: Secondary | ICD-10-CM

## 2016-02-27 DIAGNOSIS — G629 Polyneuropathy, unspecified: Secondary | ICD-10-CM

## 2016-02-27 DIAGNOSIS — F17200 Nicotine dependence, unspecified, uncomplicated: Secondary | ICD-10-CM | POA: Insufficient documentation

## 2016-02-27 DIAGNOSIS — G8929 Other chronic pain: Secondary | ICD-10-CM | POA: Insufficient documentation

## 2016-02-27 LAB — CBC WITH DIFFERENTIAL/PLATELET
Basophils Absolute: 0 cells/uL (ref 0–200)
Basophils Relative: 0 %
EOS PCT: 1 %
Eosinophils Absolute: 34 cells/uL (ref 15–500)
HCT: 37.7 % (ref 35.0–45.0)
HEMOGLOBIN: 12.4 g/dL (ref 11.7–15.5)
LYMPHS ABS: 1224 {cells}/uL (ref 850–3900)
Lymphocytes Relative: 36 %
MCH: 33.1 pg — ABNORMAL HIGH (ref 27.0–33.0)
MCHC: 32.9 g/dL (ref 32.0–36.0)
MCV: 100.5 fL — ABNORMAL HIGH (ref 80.0–100.0)
MONO ABS: 272 {cells}/uL (ref 200–950)
MPV: 9.3 fL (ref 7.5–12.5)
Monocytes Relative: 8 %
NEUTROS ABS: 1870 {cells}/uL (ref 1500–7800)
Neutrophils Relative %: 55 %
Platelets: 329 10*3/uL (ref 140–400)
RBC: 3.75 MIL/uL — AB (ref 3.80–5.10)
RDW: 13.6 % (ref 11.0–15.0)
WBC: 3.4 10*3/uL — AB (ref 3.8–10.8)

## 2016-02-27 LAB — COMPLETE METABOLIC PANEL WITH GFR
ALBUMIN: 4 g/dL (ref 3.6–5.1)
ALK PHOS: 52 U/L (ref 33–115)
ALT: 6 U/L (ref 6–29)
AST: 10 U/L (ref 10–30)
BILIRUBIN TOTAL: 0.3 mg/dL (ref 0.2–1.2)
BUN: 12 mg/dL (ref 7–25)
CO2: 21 mmol/L (ref 20–31)
CREATININE: 0.68 mg/dL (ref 0.50–1.10)
Calcium: 9.1 mg/dL (ref 8.6–10.2)
Chloride: 110 mmol/L (ref 98–110)
GFR, Est African American: >89 mL/min (ref >=60)
GFR, Est Non African American: >89 mL/min (ref >=60)
GLUCOSE: 87 mg/dL (ref 65–99)
Potassium: 4.2 mmol/L (ref 3.5–5.3)
SODIUM: 138 mmol/L (ref 135–146)
TOTAL PROTEIN: 6.7 g/dL (ref 6.1–8.1)

## 2016-02-27 LAB — POCT GLYCOSYLATED HEMOGLOBIN (HGB A1C): Hemoglobin A1C: 5.4

## 2016-02-27 MED ORDER — VARENICLINE TARTRATE 0.5 MG X 11 & 1 MG X 42 PO MISC: ORAL | 0 refills | Status: DC

## 2016-02-27 MED ORDER — GABAPENTIN 300 MG PO CAPS: 300.0000 mg | ORAL_CAPSULE | Freq: Three times a day (TID) | ORAL | 3 refills | Status: DC

## 2016-02-27 NOTE — Patient Instructions (Addendum)
Hand Pain Introduction Many things can cause hand pain. Some common causes are:  An injury.  Repeating the same movement with your hand over and over (overuse).  Osteoporosis.  Arthritis.  Lumps in the tendons or joints of the hand and wrist (ganglion cysts).  Infection. Follow these instructions at home: Pay attention to any changes in your symptoms. Take these actions to help with your discomfort:  If directed, put ice on the affected area:  Put ice in a plastic bag.  Place a towel between your skin and the bag.  Leave the ice on for 15-20 minutes, 3?4 times a day for 2 days.  Take over-the-counter and prescription medicines only as told by your health care provider.  Minimize stress on your hands and wrists as much as possible.  Take breaks from repetitive activity often.  Do stretches as told by your health care provider.  Do not do activities that make your pain worse. Contact a health care provider if:  Your pain does not get better after a few days of self-care.  Your pain gets worse.  Your pain affects your ability to do your daily activities. Get help right away if:  Your hand becomes warm, red, or swollen.  Your hand is numb or tingling.  Your hand is extremely swollen or deformed.  Your hand or fingers turn white or blue.  You cannot move your hand, wrist, or fingers. This information is not intended to replace advice given to you by your health care provider. Make sure you discuss any questions you have with your health care provider. Document Released: 01/26/2015 Document Revised: 06/07/2015 Document Reviewed: 01/25/2014  2017 Elsevier  Human Papillomavirus Human papillomavirus (HPV) is the most common sexually transmitted infection (STI). It is easy to pass it from person to person (contagious). HPV can cause cervical cancer, anal cancer, and genital warts. The genital warts can be seen and felt. Also, there may be wartlike regions in the  throat. HPV may not have any symptoms. It is possible to have HPV for a long time and not know it. You may pass HPV on to others without knowing it. Follow these instructions at home:  Take medicines as told by your doctor.  Use over-the-counter creams for itching as told by your doctor.  Keep all follow-up visits. Make sure to get Pap tests as told by your doctor.  Do not touch or scratch the warts.  Do not treat genital warts with medicines used for treating hand warts.  Do not have sex while you are getting treatment.  Do not douche or use tampons during treatment of HPV.  Tell your sex partner about your infection because he or she may also need treatment.  If you get pregnant, tell your doctor that you had HPV. Your doctor will watch your pregnancy closely. This is important to keep your baby safe.  After treatment, use condoms during sex to prevent future infections.  Have only one sex partner.  Have a sex partner who does not have other sex partners. Contact a doctor if:  The treated skin is red, swollen, or painful.  You have a fever.  You feel ill.  You feel lumps or pimple-like areas in and around your genital area.  You have bleeding of the vagina or the area that was treated.  You have pain during sex. This information is not intended to replace advice given to you by your health care provider. Make sure you discuss any questions you have  with your health care provider. Document Released: 12/13/2007 Document Revised: 06/07/2015 Document Reviewed: 04/06/2013 Elsevier Interactive Patient Education  2017 [employer].  Warts Introduction Warts are small growths on the skin. They are common, and they are caused by a type of germ (virus). Warts can occur on many areas of the body. A person may have one wart or more than one wart. Warts can spread if you scratch a wart and then scratch normal skin. Most warts will go away over many months to a couple years.  Treatments may be done if needed. Follow these instructions at home:  Apply over-the-counter and prescription medicines only as told by your doctor.  Do not apply over-the-counter wart medicines to your face or genitals before you ask your doctor if it is okay to do that.  Do not scratch or pick at a wart.  Wash your hands after you touch a wart.  Avoid shaving hair that is over a wart.  Keep all follow-up visits as told by your doctor. This is important. Contact a doctor if:  Your warts do not improve after treatment.  You have redness, swelling, or pain at the site of a wart.  You have bleeding from a wart, and the bleeding does not stop when you put light pressure on the wart.  You have diabetes and you get a wart. This information is not intended to replace advice given to you by your health care provider. Make sure you discuss any questions you have with your health care provider. Document Released: 05/02/2010 Document Revised: 06/07/2015 Document Reviewed: 03/27/2014  2017 Elsevier

## 2016-02-27 NOTE — Progress Notes (Signed)
Subjective:    Patient ID: Vanessa Butler, female    DOB: 27-Jun-1975, 41 y.o.   MRN: [id number]  HPI [name], a 41 year old female presents accompainied by husband to establish care. She was a patient of Dr. Lucianne Butler, but has been lost to follow up for 1 year. 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 She says that she has been under the care of skin specialists for the problem. She says that she continues to have warts and her left hand has become painful. She states that left hand pain is interfering with activities of daily living. Current pain intensity is 9/10 described as burning and tingling. She states that her left hand feels like "its on fire" at times. She has been taking Ibuprofen 800 mg as prescribed without sustained relief.  She is also complaining of spotting between menstrual cycles. She was a patient of Dr. Gracy Butler for gynecology, who has retired. She says that she was treated for a "bleeding cervix" with antibiotics by her previous provider. Will request medical records for further review. She says that she has 3 sons and has previously had normal menstrual cycles.   She also has a history of chronic tobacco abuse. She says that she has been smoking cigarettes since age 42. She is not ready to quit.   Review of Systems  Constitutional: Negative.  Negative for fever and unexpected weight change.  HENT: Negative.   Eyes: Negative.  Negative for visual disturbance.  Respiratory: Negative.   Cardiovascular: Negative.  Negative for chest pain, palpitations and leg swelling.  Gastrointestinal: Negative.   Endocrine: Negative.  Negative for polydipsia, polyphagia and polyuria.  Genitourinary: Positive for menstrual problem and vaginal bleeding.       Abnormal vaginal bleeding and spotting between menses  Musculoskeletal: Negative.   Skin: Negative.        Viral warts to left hand and left foot  Allergic/Immunologic: Negative.    Neurological: Negative.   Hematological: Negative.   Psychiatric/Behavioral: Negative.         Objective:   Physical Exam  Constitutional: She is oriented to person, place, and time.  HENT:  Head: Normocephalic and atraumatic.  Right Ear: External ear normal.  Left Ear: External ear normal.  Mouth/Throat: Oropharynx is clear and moist.  Eyes: Conjunctivae and EOM are normal. Pupils are equal, round, and reactive to light.  Neck: Normal range of motion. Neck supple.  Pulmonary/Chest: Effort normal and breath sounds normal.  Abdominal: Soft.  Musculoskeletal: Normal range of motion.  Neurological: She is alert and oriented to person, place, and time. She has normal reflexes.  Skin: Skin is warm and dry.  Left hand warts and left foot warts. Hyperpigmented discoloration, round, raised, hard, and tender to palpation.   Psychiatric: She has a normal mood and affect. Her behavior is normal. Judgment and thought content normal.         BP 118/60 (BP Location: Right Arm, Patient Position: Sitting, Cuff Size: Normal)   Pulse 84   Temp 98.5 F (36.9 C) (Oral)   Resp 16   Ht 5' (1.524 m)   Wt 130 lb (59 kg)   LMP 02/01/2016   SpO2 100%   BMI 25.39 kg/m  Assessment & Plan:   1. Viral warts, unspecified type Patient warrants a referral to a dermatologist for further work up and evaluation.  - Ambulatory referral to Dermatology  2. Abnormal vaginal bleeding  -  Ambulatory referral to Gynecology  3. Tobacco dependence Discussed smoking cessation at length. Recommend that patient start Chantix.  - varenicline (CHANTIX STARTING MONTH PAK) 0.5 MG X 11 & 1 MG X 42 tablet; Take one 0.5 mg tablet by mouth once daily for 3 days, then increase to one 0.5 mg tablet twice daily for 4 days, then increase to one 1 mg tablet twice daily.  Dispense: 53 tablet; Refill: 0  4. Chronic hand pain, left Continue NSaids at current dosage. Will also start a trial of gabapentin. Will follow up in 1  month..   5. Neuropathy (HCC) - gabapentin (NEURONTIN) 300 MG capsule; Take 1 capsule (300 mg total) by mouth 3 (three) times daily.  Dispense: 90 capsule; Refill: 3 - HgB A1c - CBC with Differential - COMPLETE METABOLIC PANEL WITH GFR  6. Screening for HIV (human immunodeficiency virus) - HIV antibody (with reflex)  7. Immunization due - Pneumococcal polysaccharide vaccine 23-valent greater than or equal to 2yo subcutaneous/IM  RTC: 1 month for neuropathy   [name], FNP  Medical records request.

## 2016-02-28 LAB — HIV ANTIBODY (ROUTINE TESTING W REFLEX): HIV: NONREACTIVE

## 2016-03-26 ENCOUNTER — Ambulatory Visit (INDEPENDENT_AMBULATORY_CARE_PROVIDER_SITE_OTHER): Payer: Medicaid Other | Admitting: Family Medicine

## 2016-03-26 ENCOUNTER — Encounter: Payer: Self-pay | Admitting: Family Medicine

## 2016-03-26 VITALS — BP 126/71 | HR 90 | Temp 98.0°F | Resp 14 | Ht 60.0 in | Wt 130.0 lb

## 2016-03-26 DIAGNOSIS — G629 Polyneuropathy, unspecified: Secondary | ICD-10-CM

## 2016-03-26 DIAGNOSIS — M7989 Other specified soft tissue disorders: Secondary | ICD-10-CM | POA: Diagnosis not present

## 2016-03-26 DIAGNOSIS — B079 Viral wart, unspecified: Secondary | ICD-10-CM

## 2016-03-26 DIAGNOSIS — F17200 Nicotine dependence, unspecified, uncomplicated: Secondary | ICD-10-CM | POA: Diagnosis not present

## 2016-03-26 MED ORDER — PREGABALIN 50 MG PO CAPS: 50.0000 mg | ORAL_CAPSULE | Freq: Two times a day (BID) | ORAL | 0 refills | Status: DC

## 2016-03-26 MED ORDER — KETOROLAC TROMETHAMINE 60 MG/2ML IM SOLN
30.0000 mg | Freq: Once | INTRAMUSCULAR | Status: AC
  Administered 2016-03-26: 30 mg via INTRAMUSCULAR

## 2016-03-26 NOTE — Patient Instructions (Addendum)
Warts Warts are small growths on the skin. They are common, and they are caused by a type of germ (virus). Warts can occur on many areas of the body. A person may have one wart or more than one wart. Warts can spread if you scratch a wart and then scratch normal skin. Most warts will go away over many months to a couple years. Treatments may be done if needed. Follow these instructions at home:  Apply over-the-counter and prescription medicines only as told by your doctor.  Do not apply over-the-counter wart medicines to your face or genitals before you ask your doctor if it is okay to do that.  Do not scratch or pick at a wart.  Wash your hands after you touch a wart.  Avoid shaving hair that is over a wart.  Keep all follow-up visits as told by your doctor. This is important. Contact a doctor if:  Your warts do not improve after treatment.  You have redness, swelling, or pain at the site of a wart.  You have bleeding from a wart, and the bleeding does not stop when you put light pressure on the wart.  You have diabetes and you get a wart. This information is not intended to replace advice given to you by your health care provider. Make sure you discuss any questions you have with your health care provider. Document Released: 05/02/2010 Document Revised: 06/07/2015 Document Reviewed: 03/27/2014 Elsevier Interactive Patient Education  2017 [employer].

## 2016-03-26 NOTE — Progress Notes (Signed)
Subjective:    Patient ID: Vanessa Butler, female    DOB: 1975/05/16, 41 y.o.   MRN: [id number]  HPI 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 She was started on Gabapentin 1 month ago for neuropath primarily to right hand without success.  Satient has a history of viral warts to left hand and left foot. She says that she has been under the care of skin specialists for the problem. She says that she continues to have warts and her left hand has become painful. She states that left hand pain is interfering with activities of daily living. Current pain intensity is 9/10 described as burning and tingling. She states that her left hand feels like "its on fire" at times. Past Medical History:  Diagnosis Date  . Skin disorder    Immunization History  Administered Date(s) Administered  . Pneumococcal Polysaccharide-23 02/27/2016  . Tdap 02/27/2016   Review of Systems  Constitutional: Negative.  Negative for fever and unexpected weight change.  HENT: Negative.   Eyes: Negative.  Negative for visual disturbance.  Respiratory: Negative.   Cardiovascular: Negative.  Negative for chest pain, palpitations and leg swelling.  Gastrointestinal: Negative.   Endocrine: Negative.  Negative for polydipsia, polyphagia and polyuria.  Musculoskeletal: Negative.        Pain to right 3rd finger  Skin: Negative.        Viral warts to left hand and left foot  Allergic/Immunologic: Negative.   Neurological: Negative.   Hematological: Negative.   Psychiatric/Behavioral: Negative.         Objective:   Physical Exam  Constitutional: She is oriented to person, place, and time.  HENT:  Head: Normocephalic and atraumatic.  Right Ear: External ear normal.  Left Ear: External ear normal.  Mouth/Throat: Oropharynx is clear and moist.  Eyes: Conjunctivae and EOM are normal. Pupils are equal, round, and reactive to light.  Neck: Normal range  of motion. Neck supple.  Pulmonary/Chest: Effort normal and breath sounds normal.  Abdominal: Soft.  Musculoskeletal: Normal range of motion.  Neurological: She is alert and oriented to person, place, and time. She has normal reflexes.  Skin: Skin is warm and dry.  Left hand warts and left foot warts. Hyperpigmented discoloration, round, raised, hard, and tender to palpation. Swelling to right 3rd finger, warm to touch.   Psychiatric: She has a normal mood and affect. Her behavior is normal. Judgment and thought content normal.       BP 126/71 (BP Location: Left Arm, Patient Position: Sitting, Cuff Size: Normal)   Pulse 90   Temp 98 F (36.7 C) (Oral)   Resp 14   Ht 5' (1.524 m)   Wt 130 lb (59 kg)   LMP 03/23/2016   SpO2 100%   BMI 25.39 kg/m  Assessment & Plan:   1. Neuropathy ([place]) Will start a trial of Lyrica for neuropathy.  - pregabalin (LYRICA) 50 MG capsule; Take 1 capsule (50 mg total) by mouth 2 (two) times daily.  Dispense: 60 capsule; Refill: 0  2. Swelling of left middle finger - ketorolac (TORADOL) injection 30 mg; Inject 1 mL (30 mg total) into the muscle once.  3. Viral warts, unspecified type Sent referral to Dermatology on last month, appointment is scheduled for 04/03/2016.   4. Tobacco dependence Smoking cessation instruction/counseling given:  counseled patient on the dangers of tobacco use, advised patient to stop smoking, and reviewed strategies  to maximize success   [name],  FNP                                                                                                                                                                                                                                                                                                                                                                                                   Medical records request.

## 2016-04-03 DIAGNOSIS — D485 Neoplasm of uncertain behavior of skin: Secondary | ICD-10-CM | POA: Diagnosis not present

## 2016-04-10 ENCOUNTER — Telehealth: Payer: Self-pay

## 2016-04-14 ENCOUNTER — Other Ambulatory Visit: Payer: Self-pay | Admitting: Family Medicine

## 2016-04-14 DIAGNOSIS — G629 Polyneuropathy, unspecified: Secondary | ICD-10-CM

## 2016-04-14 MED ORDER — AMITRIPTYLINE HCL 75 MG PO TABS
75.0000 mg | ORAL_TABLET | Freq: Every day | ORAL | 0 refills | Status: DC
Start: 2016-04-14 — End: 2016-05-23

## 2016-04-14 NOTE — Progress Notes (Signed)
Meds ordered this encounter  Medications  . amitriptyline (ELAVIL) 75 MG tablet    Sig: Take 1 tablet (75 mg total) by mouth at bedtime.    Dispense:  30 tablet    Refill:  0    Will start a trial of Amitryptiline for neuropathy/chronic hand pain. Do not drink alcohol, drive, or operate machinery, while taking medication.  Follow up by phone in 1 month.     Donia Pounds  MSN, FNP-C [facility] [address] [place], [place] [address] [phone]

## 2016-04-17 ENCOUNTER — Other Ambulatory Visit: Payer: Self-pay | Admitting: Family Medicine

## 2016-05-02 ENCOUNTER — Encounter: Payer: Medicaid Other | Admitting: Obstetrics and Gynecology

## 2016-05-16 ENCOUNTER — Telehealth: Payer: Self-pay | Admitting: Family Medicine

## 2016-05-16 ENCOUNTER — Telehealth: Payer: Self-pay

## 2016-05-16 NOTE — Telephone Encounter (Signed)
Patient states that the Amitriptyline is working but makes her gain weight. Patient would like to have something else sent into the [facility] on [address].

## 2016-05-16 NOTE — Telephone Encounter (Signed)
Notified patient to discuss medication regimen, phone is not set up to receive messages. I do not recommend changing medication since it is effective for nerve pain. She has tried numerous medications for this problem in the past. I recommend a low fat, low carbohydrate diet divided over 5-6 small meals and starting an exercise regimen 4-5 days per week.   Donia Pounds  MSN, FNP-C [facility] [address] [place], [place] [address] [phone]

## 2016-05-19 NOTE — Telephone Encounter (Signed)
Called, no answer. Voicemail was not set up.

## 2016-05-23 ENCOUNTER — Encounter: Payer: Self-pay | Admitting: Family Medicine

## 2016-05-23 ENCOUNTER — Ambulatory Visit (INDEPENDENT_AMBULATORY_CARE_PROVIDER_SITE_OTHER): Payer: Medicaid Other | Admitting: Family Medicine

## 2016-05-23 VITALS — BP 126/67 | HR 96 | Temp 98.4°F | Resp 16 | Ht 60.0 in | Wt 139.0 lb

## 2016-05-23 DIAGNOSIS — G629 Polyneuropathy, unspecified: Secondary | ICD-10-CM

## 2016-05-23 DIAGNOSIS — F17200 Nicotine dependence, unspecified, uncomplicated: Secondary | ICD-10-CM

## 2016-05-23 DIAGNOSIS — R635 Abnormal weight gain: Secondary | ICD-10-CM | POA: Diagnosis not present

## 2016-05-23 DIAGNOSIS — B079 Viral wart, unspecified: Secondary | ICD-10-CM | POA: Diagnosis not present

## 2016-05-23 LAB — COMPLETE METABOLIC PANEL WITH GFR
ALT: 13 U/L (ref 6–29)
AST: 14 U/L (ref 10–30)
Albumin: 4.1 g/dL (ref 3.6–5.1)
Alkaline Phosphatase: 61 U/L (ref 33–115)
BILIRUBIN TOTAL: 0.2 mg/dL (ref 0.2–1.2)
BUN: 17 mg/dL (ref 7–25)
CALCIUM: 9.2 mg/dL (ref 8.6–10.2)
CO2: 21 mmol/L (ref 20–31)
Chloride: 107 mmol/L (ref 98–110)
Creat: 0.75 mg/dL (ref 0.50–1.10)
GFR, Est African American: >89 mL/min (ref >=60)
GLUCOSE: 81 mg/dL (ref 65–99)
Potassium: 4.6 mmol/L (ref 3.5–5.3)
SODIUM: 136 mmol/L (ref 135–146)
TOTAL PROTEIN: 7.2 g/dL (ref 6.1–8.1)

## 2016-05-23 LAB — POCT URINALYSIS DIP (DEVICE)
Bilirubin Urine: NEGATIVE
Glucose, UA: NEGATIVE mg/dL
Ketones, ur: NEGATIVE mg/dL
Leukocytes, UA: NEGATIVE
Nitrite: NEGATIVE
Protein, ur: NEGATIVE mg/dL
Specific Gravity, Urine: 1.025 (ref 1.005–1.030)
Urobilinogen, UA: 0.2 mg/dL (ref 0.0–1.0)
pH: 5.5 (ref 5.0–8.0)

## 2016-05-23 LAB — TSH: TSH: 0.99 mIU/L

## 2016-05-23 LAB — POCT GLYCOSYLATED HEMOGLOBIN (HGB A1C): HEMOGLOBIN A1C: 5.6

## 2016-05-23 MED ORDER — AMITRIPTYLINE HCL 75 MG PO TABS: 75.0000 mg | ORAL_TABLET | Freq: Every day | ORAL | 1 refills | Status: DC

## 2016-05-23 NOTE — Progress Notes (Signed)
Subjective:    Patient ID: Lucy Antigua, female    DOB: Dec 21, 1975, 41 y.o.   MRN: [id number]  HPI Ms. Vaani Morren, a 41 year old female with a history of verucca vulgaris of hands and feet and peripheral neuropathy presents for a follow up. She was started on Amitriptyline  1 month ago for neuropathy primarily to right hand with moderate relief. She was previously prescribed gabapentin, but discontinued due to unsustained relief.  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 She says that Amitriptyline has been effective in decreasing nerve pain.   She says that she has been under the care of dermatology for the problem. She continues to have warts and her left hand has become painful. She states that left hand pain is interfering with activities of daily living. Current pain intensity is 5/10 described as burning and tingling. She states that her left hand feels like "its on fire" at times. She was recently referred to dermatology and has an appointment on 05/26/2016.  Past Medical History:  Diagnosis Date  . Skin disorder    Immunization History  Administered Date(s) Administered  . Pneumococcal Polysaccharide-23 02/27/2016  . Tdap 02/27/2016   Review of Systems  Constitutional: Negative.  Negative for fever and unexpected weight change.  HENT: Negative.   Eyes: Negative.  Negative for visual disturbance.  Respiratory: Negative.   Cardiovascular: Negative.  Negative for chest pain, palpitations and leg swelling.  Gastrointestinal: Negative.   Endocrine: Negative.  Negative for polydipsia, polyphagia and polyuria.  Musculoskeletal: Negative.        Pain to right 3rd finger  Skin: Negative.        Viral warts to left hand and left foot  Allergic/Immunologic: Negative.   Neurological: Negative.   Hematological: Negative.   Psychiatric/Behavioral: Negative.         Objective:   Physical Exam  Constitutional: She is oriented to person, place, and time.   HENT:  Head: Normocephalic and atraumatic.  Right Ear: External ear normal.  Left Ear: External ear normal.  Mouth/Throat: Oropharynx is clear and moist.  Eyes: Conjunctivae and EOM are normal. Pupils are equal, round, and reactive to light.  Neck: Normal range of motion. Neck supple.  Pulmonary/Chest: Effort normal and breath sounds normal.  Abdominal: Soft.  Musculoskeletal: Normal range of motion.  Neurological: She is alert and oriented to person, place, and time. She has normal reflexes.  Skin: Skin is warm and dry.  Left hand warts and left foot warts. Hyperpigmented discoloration, round, raised, hard, and tender to palpation. Swelling to right 3rd finger, warm to touch.   Psychiatric: She has a normal mood and affect. Her behavior is normal. Judgment and thought content normal.   c    BP 126/67 (BP Location: Right Arm, Patient Position: Sitting, Cuff Size: Normal)   Pulse 96   Temp 98.4 F (36.9 C) (Oral)   Resp 16   Ht 5' (1.524 m)   Wt 139 lb (63 kg)   LMP 04/19/2016   SpO2 100%   BMI 27.15 kg/m  Assessment & Plan:  1. Neuropathy - amitriptyline (ELAVIL) 75 MG tablet; Take 1 tablet (75 mg total) by mouth at bedtime.  Dispense: 30 tablet; Refill: 1  2. Weight gain Recommend a lowfat, low carbohydrate diet divided over 5-6 small meals, increase water intake to 6-8 glasses, and 150 minutes per week of cardiovascular exercise.   - TSH - COMPLETE METABOLIC PANEL WITH GFR -  HgB A1c  3. Viral warts, unspecified type -Follow up in dermatology as scheduled  4. Tobacco dependence Smoking cessation instruction/counseling given:  counseled patient on the dangers of tobacco use, advised patient to stop smoking, and reviewed strategies to maximize success   RTC: 1 month for pap smear   Donia Pounds  MSN, FNP-C [place], [place]  [id number] [phone]                                                                                                                                                                                                                                                                                                                                                                                                    Medical records request.

## 2016-05-23 NOTE — Patient Instructions (Addendum)
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   Will follow up by phone with laboratory results  Recommend a lowfat, low carbohydrate diet divided over 5-6 small meals, increase water intake to 6-8 glasses, and 150 minutes per week of cardiovascular exercise.    Smoking Tobacco Information Smoking tobacco will very likely harm your health. Tobacco contains a poisonous (toxic), colorless chemical called nicotine. Nicotine affects the brain and makes tobacco addictive. This change in your brain can make it hard to stop smoking. Tobacco also has other toxic chemicals that can hurt your body and raise your risk of many cancers. How can smoking tobacco affect me? Smoking tobacco can increase your chances of having serious health conditions, such as:  Cancer. Smoking is most commonly associated with lung cancer, but can lead to cancer in other parts of the body.  Chronic obstructive pulmonary disease (COPD). This is a long-term lung condition that makes it hard to breathe. It also gets worse over time.  High blood pressure (hypertension), heart disease, stroke, or heart attack.  Lung infections, such as pneumonia.  Cataracts. This is when the lenses in the eyes become clouded.  Digestive problems. This may include peptic ulcers, heartburn, and gastroesophageal reflux disease (GERD).  Oral health problems, such as gum disease and tooth loss.  Loss of taste and smell. Smoking can affect your appearance by causing:  Wrinkles.  Yellow or stained teeth, fingers, and fingernails. Smoking tobacco can also affect your social life.  Many workplaces, [employer], hotels, and public places are tobacco-free. This means that you may experience challenges in finding places to smoke when away from home.  The cost of a smoking habit can be expensive. Expenses for someone who smokes come in two ways:  You spend money on a regular basis to buy tobacco.  Your  health care costs in the long-term are higher if you smoke.  Tobacco smoke can also affect the health of those around you. Children of smokers have greater chances of:  Sudden infant death syndrome (SIDS).  Ear infections.  Lung infections. What lifestyle changes can be made?  Do not start smoking. Quit if you already do.  To quit smoking:  Make a plan to quit smoking and commit yourself to it. Look for programs to help you and ask your health care provider for recommendations and ideas.  Talk with your health care provider about using nicotine replacement medicines to help you quit. Medicine replacement medicines include gum, lozenges, patches, sprays, or pills.  Do not replace cigarette smoking with electronic cigarettes, which are commonly called e-cigarettes. The safety of e-cigarettes is not known, and some may contain harmful chemicals.  Avoid places, people, or situations that tempt you to smoke.  If you try to quit but return to smoking, don't give up hope. It is very common for people to try a number of times before they fully succeed. When you feel ready again, give it another try.  Quitting smoking might affect the way you eat as well as your weight. Be prepared to monitor your eating habits. Get support in planning and following a healthy diet.  Ask your health care provider about having regular tests (screenings) to check for cancer. This may include blood tests, imaging tests, and other tests.  Exercise regularly. Consider taking walks, joining a gym, or doing yoga or exercise classes.  Develop skills to manage your stress. These skills include meditation. What are the benefits of quitting smoking?  By quitting smoking, you may:  Lower your risk of getting cancer and other diseases caused by smoking.  Live longer.  Breathe better.  Lower your blood pressure and heart rate.  Stop your addiction to tobacco.  Stop creating secondhand smoke that hurts other  people.  Improve your sense of taste and smell.  Look better over time, due to having fewer wrinkles and less staining. What can happen if changes are not made? If you do not stop smoking, you may:  Get cancer and other diseases.  Develop COPD or other long-term (chronic) lung conditions.  Develop serious problems with your heart and blood vessels (cardiovascular system).  Need more tests to screen for problems caused by smoking.  Have higher, long-term healthcare costs from medicines or treatments related to smoking.  Continue to have worsening changes in your lungs, mouth, and nose. Where to find support: To get support to quit smoking, consider:  Asking your health care provider for more information and resources.  Taking classes to learn more about quitting smoking.  Looking for local organizations that offer resources about quitting smoking.  Joining a support group for people who want to quit smoking in your local community. Where to find more information: You may find more information about quitting smoking from:  HelpGuide.org: www.helpguide.org/articles/addictions/how-to-quit-smoking.htm  https://hall.com/: smokefree.gov  American Lung Association: www.lung.org Contact a health care provider if:  You have problems breathing.  Your lips, nose, or fingers turn blue.  You have chest pain.  You are coughing up blood.  You feel faint or you pass out.  You have other noticeable changes that cause you to worry. Summary  Smoking tobacco can negatively affect your health, the health of those around you, your finances, and your social life.  Do not start smoking. Quit if you already do. If you need help quitting, ask your health care provider.  Think about joining a support group for people who want to quit smoking in your local community. There are many effective programs that will help you to quit this behavior. This information is not intended to replace advice  given to you by your health care provider. Make sure you discuss any questions you have with your health care provider. Document Released: 01/15/2016 Document Revised: 01/15/2016 Document Reviewed: 01/15/2016 Elsevier Interactive Patient Education  2017 [place].  Tobacco Use Disorder Tobacco use disorder (TUD) is a mental disorder. It is the long-term use of tobacco in spite of related health problems or difficulty with normal life activities. Tobacco is most commonly smoked as cigarettes and less commonly as cigars or pipes. Smokeless chewing tobacco and snuff are also popular. People with TUD get a feeling of extreme pleasure (euphoria) from using tobacco and have a desire to use it again and again. Repeated use of tobacco can cause problems. The addictive effects of tobacco are due mainly tothe ingredient nicotine. Nicotine also causes a rush of adrenaline (epinephrine) in the body. This leads to increased blood pressure, heart rate, and breathing rate. These changes may cause problems for people with high blood pressure, weak hearts, or lung disease. High doses of nicotine in children and pets can lead to seizures and death. Tobacco contains a number of other unsafe chemicals. These chemicals are especially harmful when inhaled as smoke and can damage almost every organ in the body. Smokers live shorter lives than nonsmokers and are at risk of dying from a number of diseases and cancers. Tobacco smoke can also cause health problems for nonsmokers (due  to inhaling secondhand smoke). Smoking is also a fire hazard. TUD usually starts in the late teenage years and is most common in young adults between the ages of 37 and 36 years. People who start smoking earlier in life are more likely to continue smoking as adults. TUD is somewhat more common in men than women. People with TUD are at higher risk for using alcohol and other drugs of abuse. What increases the risk? Risk factors for TUD  include:  Having family members with the disorder.  Being around people who use tobacco.  Having an existing mental health issue such as schizophrenia, depression, bipolar disorder, ADHD, or posttraumatic stress disorder (PTSD). What are the signs or symptoms? People with tobacco use disorder have two or more of the following signs and symptoms within 12 months:  Use of more tobacco over a longer period than intended.  Not able to cut down or control tobacco use.  A lot of time spent obtaining or using tobacco.  Strong desire or urge to use tobacco (craving). Cravings may last for 6 months or longer after quitting.  Use of tobacco even when use leads to major problems at work, school, or home.  Use of tobacco even when use leads to relationship problems.  Giving up or cutting down on important life activities because of tobacco use.  Repeatedly using tobacco in situations where it puts you or others in physical danger, like smoking in bed.  Use of tobacco even when it is known that a physical or mental problem is likely related to tobacco use.  Physical problems are numerous and may include chronic bronchitis, emphysema, lung and other cancers, gum disease, high blood pressure, heart disease, and stroke.  Mental problems caused by tobacco may include difficulty sleeping and anxiety.  Need to use greater amounts of tobacco to get the same effect. This means you have developed a tolerance.  Withdrawal symptoms as a result of stopping or rapidly cutting back use. These symptoms may last a month or more after quitting and include the following:  Depressed, anxious, or irritable mood.  Difficulty concentrating.  Increased appetite.  Restlessness or trouble sleeping.  Use of tobacco to avoid withdrawal symptoms. How is this diagnosed? Tobacco use disorder is diagnosed by your health care provider. A diagnosis may be made by:  Your health care provider asking questions about  your tobacco use and any problems it may be causing.  A physical exam.  Lab tests.  You may be referred to a mental health professional or addiction specialist. The severity of tobacco use disorder depends on the number of signs and symptoms you have:  Mild-Two or three symptoms.  Moderate-Four or five symptoms.  Severe-Six or more symptoms. How is this treated? Many people with tobacco use disorder are unable to quit on their own and need help. Treatment options include the following:  Nicotine replacement therapy (NRT). NRT provides nicotine without the other harmful chemicals in tobacco. NRT gradually lowers the dosage of nicotine in the body and reduces withdrawal symptoms. NRT is available in over-the-counter forms (gum, lozenges, and skin patches) as well as prescription forms (mouth inhaler and nasal spray).  Medicines.This may include:  Antidepressant medicine that may reduce nicotine cravings.  A medicine that acts on nicotine receptors in the brain to reduce cravings and withdrawal symptoms. It may also block the effects of tobacco in people with TUD who relapse.  Counseling or talk therapy. A form of talk therapy called behavioral therapy is  commonly used to treat people with TUD. Behavioral therapy looks at triggers for tobacco use, how to avoid them, and how to cope with cravings. It is most effective in person or by phone but is also available in self-help forms (books and Internet websites).  Support groups. These provide emotional support, advice, and guidance for quitting tobacco. The most effective treatment for TUD is usually a combination of medicine, talk therapy, and support groups. Follow these instructions at home:  Keep all follow-up visits as directed by your health care provider. This is important.  Take medicines only as directed by your health care provider.  Check with your health care provider before starting new prescription or over-the-counter  medicines. Contact a health care provider if:  You are not able to take your medicines as prescribed.  Treatment is not helping your TUD and your symptoms get worse. Get help right away if:  You have serious thoughts about hurting yourself or others.  You have trouble breathing, chest pain, sudden weakness, or sudden numbness in part of your body. This information is not intended to replace advice given to you by your health care provider. Make sure you discuss any questions you have with your health care provider. Document Released: 09/05/2003 Document Revised: 09/02/2015 Document Reviewed: 02/25/2013 Elsevier Interactive Patient Education  2017 [place] and Cholesterol Restricted Diet Getting too much fat and cholesterol in your diet may cause health problems. Following this diet helps keep your fat and cholesterol at normal levels. This can keep you from getting sick. What types of fat should I choose?  Choose monosaturated and polyunsaturated fats. These are found in foods such as olive oil, canola oil, flaxseeds, walnuts, almonds, and seeds.  Eat more omega-3 fats. Good choices include salmon, mackerel, sardines, tuna, flaxseed oil, and ground flaxseeds.  Limit saturated fats. These are in animal products such as meats, butter, and cream. They can also be in plant products such as palm oil, palm kernel oil, and coconut oil.  Avoid foods with partially hydrogenated oils in them. These contain trans fats. Examples of foods that have trans fats are stick margarine, some tub margarines, cookies, crackers, and other baked goods. What general guidelines do I need to follow?  Check food labels. Look for the words "trans fat" and "saturated fat."  When preparing a meal:  Fill half of your plate with vegetables and green salads.  Fill one fourth of your plate with whole grains. Look for the word "whole" as the first word in the ingredient list.  Fill one fourth of your  plate with lean protein foods.  Eat more foods that have fiber, like apples, carrots, beans, peas, and barley.  Eat more home-cooked foods. Eat less at restaurants and buffets.  Limit or avoid alcohol.  Limit foods high in starch and sugar.  Limit fried foods.  Cook foods without frying them. Baking, boiling, grilling, and broiling are all great options.  Lose weight if you are overweight. Losing even a small amount of weight can help your overall health. It can also help prevent diseases such as diabetes and heart disease. What foods can I eat? Grains  Whole grains, such as whole wheat or whole grain breads, crackers, cereals, and pasta. Unsweetened oatmeal, bulgur, barley, quinoa, or brown rice. Corn or whole wheat flour tortillas. Vegetables  Fresh or frozen vegetables (raw, steamed, roasted, or grilled). Green salads. Fruits  All fresh, canned (in natural juice), or frozen fruits. Meat and Other Protein Products  Ground beef (85% or leaner), grass-fed beef, or beef trimmed of fat. Skinless chicken or [place]. Ground chicken or [place]. Pork trimmed of fat. All fish and seafood. Eggs. Dried beans, peas, or lentils. Unsalted nuts or seeds. Unsalted canned or dry beans. Dairy  Low-fat dairy products, such as skim or 1% milk, 2% or reduced-fat cheeses, low-fat ricotta or cottage cheese, or plain low-fat yogurt. Fats and Oils  Tub margarines without trans fats. Light or reduced-fat mayonnaise and salad dressings. Avocado. Olive, canola, sesame, or safflower oils. Natural peanut or almond butter (choose ones without added sugar and oil). The items listed above may not be a complete list of recommended foods or beverages. Contact your dietitian for more options.  What foods are not recommended? Grains  White bread. White pasta. White rice. Cornbread. Bagels, pastries, and croissants. Crackers that contain trans fat. Vegetables  White potatoes. Corn. Creamed or fried vegetables. Vegetables  in a cheese sauce. Fruits  Dried fruits. Canned fruit in light or heavy syrup. Fruit juice. Meat and Other Protein Products  Fatty cuts of meat. Ribs, chicken wings, bacon, sausage, bologna, salami, chitterlings, fatback, hot dogs, bratwurst, and packaged luncheon meats. Liver and organ meats. Dairy  Whole or 2% milk, cream, half-and-half, and cream cheese. Whole milk cheeses. Whole-fat or sweetened yogurt. Full-fat cheeses. Nondairy creamers and whipped toppings. Processed cheese, cheese spreads, or cheese curds. Sweets and Desserts  Corn syrup, sugars, honey, and molasses. Candy. Jam and jelly. Syrup. Sweetened cereals. Cookies, pies, cakes, donuts, muffins, and ice cream. Fats and Oils  Butter, stick margarine, lard, shortening, ghee, or bacon fat. Coconut, palm kernel, or palm oils. Beverages  Alcohol. Sweetened drinks (such as sodas, lemonade, and fruit drinks or punches). The items listed above may not be a complete list of foods and beverages to avoid. Contact your dietitian for more information.  This information is not intended to replace advice given to you by your health care provider. Make sure you discuss any questions you have with your health care provider. Document Released: 07/01/2011 Document Revised: 09/06/2015 Document Reviewed: 03/31/2013 Elsevier Interactive Patient Education  2017 [employer].

## 2016-06-20 ENCOUNTER — Encounter: Payer: Self-pay | Admitting: Family Medicine

## 2016-06-20 ENCOUNTER — Other Ambulatory Visit (HOSPITAL_COMMUNITY)
Admission: RE | Admit: 2016-06-20 | Discharge: 2016-06-20 | Disposition: A | Discharge status: Home / Self Care | Payer: Medicaid Other | Source: Ambulatory Visit | Attending: Family Medicine | Admitting: Family Medicine

## 2016-06-20 ENCOUNTER — Ambulatory Visit (INDEPENDENT_AMBULATORY_CARE_PROVIDER_SITE_OTHER): Payer: Medicaid Other | Admitting: Family Medicine

## 2016-06-20 VITALS — BP 131/73 | HR 113 | Temp 99.0°F | Resp 16 | Ht 60.0 in | Wt 142.0 lb

## 2016-06-20 DIAGNOSIS — G8929 Other chronic pain: Secondary | ICD-10-CM | POA: Diagnosis not present

## 2016-06-20 DIAGNOSIS — Z1231 Encounter for screening mammogram for malignant neoplasm of breast: Secondary | ICD-10-CM | POA: Diagnosis not present

## 2016-06-20 DIAGNOSIS — G629 Polyneuropathy, unspecified: Secondary | ICD-10-CM

## 2016-06-20 DIAGNOSIS — Z01419 Encounter for gynecological examination (general) (routine) without abnormal findings: Secondary | ICD-10-CM

## 2016-06-20 DIAGNOSIS — F17200 Nicotine dependence, unspecified, uncomplicated: Secondary | ICD-10-CM | POA: Diagnosis not present

## 2016-06-20 DIAGNOSIS — M79642 Pain in left hand: Secondary | ICD-10-CM | POA: Diagnosis not present

## 2016-06-20 DIAGNOSIS — Z1239 Encounter for other screening for malignant neoplasm of breast: Secondary | ICD-10-CM

## 2016-06-20 LAB — POCT URINALYSIS DIP (DEVICE)
BILIRUBIN URINE: NEGATIVE
GLUCOSE, UA: NEGATIVE mg/dL
Hgb urine dipstick: NEGATIVE
KETONES UR: NEGATIVE mg/dL
Leukocytes, UA: NEGATIVE
Nitrite: NEGATIVE
Protein, ur: NEGATIVE mg/dL
SPECIFIC GRAVITY, URINE: 1.015 (ref 1.005–1.030)
Urobilinogen, UA: 0.2 mg/dL (ref 0.0–1.0)
pH: 5 (ref 5.0–8.0)

## 2016-06-20 MED ORDER — B & B CARPAL TUNNEL BRACE MISC: 1.0000 | Freq: Every day | 0 refills | Status: DC

## 2016-06-20 MED ORDER — AMITRIPTYLINE HCL 100 MG PO TABS: 100.0000 mg | ORAL_TABLET | Freq: Every day | ORAL | 1 refills | Status: DC

## 2016-06-20 MED ORDER — GABAPENTIN 300 MG PO CAPS: 300.0000 mg | ORAL_CAPSULE | Freq: Three times a day (TID) | ORAL | 1 refills | Status: DC

## 2016-06-20 NOTE — Progress Notes (Signed)
Subjective:    Patient ID: Vanessa Butler, female    DOB: 05-17-1975, 41 y.o.   MRN: [id number]   Vanessa Butler, a 41 year old female with a history of verucca vulgaris of hands and feet and peripheral neuropathy presents for a routine gynecological exam exam. She was started on Amitriptyline  1 month ago for neuropathy primarily to right hand with moderate relief. S Patient has a history of viral warts to left hand and left foot. She says that Amitriptyline has been effective in decreasing nerve pain.   She says that she has been under the care of dermatology for the problem. She continues to have warts and her left hand has become painful. She states that left hand pain is interfering with activities of daily living. Current pain intensity is 5/10 described as burning and tingling. She states that her left hand feels like "its on fire" at times. She was recently referred to dermatology and evaluated on 05/26/2016. She was prescribed urea 20%, she has been applying cream nightly under gloves.    Gynecologic Exam  The patient's pertinent negatives include no genital itching, genital lesions, genital odor, genital rash, missed menses, pelvic pain, vaginal bleeding or vaginal discharge. She is not pregnant. Pertinent negatives include no abdominal pain, anorexia, back pain, chills, diarrhea, discolored urine, fever, frequency, headaches, hematuria, joint pain, joint swelling, nausea, painful intercourse, rash, sore throat, urgency or vomiting. She is sexually active. Her menstrual history has been regular. There is no history of a Cesarean section or an STD.   Past Medical History:  Diagnosis Date  . Skin disorder    Immunization History  Administered Date(s) Administered  . Pneumococcal Polysaccharide-23 02/27/2016  . Tdap 02/27/2016   Review of Systems  Constitutional: Negative.  Negative for chills, fever and unexpected weight change.  HENT: Negative.  Negative for sore throat.    Eyes: Negative.  Negative for visual disturbance.  Respiratory: Negative.   Cardiovascular: Negative.  Negative for chest pain, palpitations and leg swelling.  Gastrointestinal: Negative.  Negative for abdominal pain, anorexia, diarrhea, nausea and vomiting.  Endocrine: Negative.  Negative for polydipsia, polyphagia and polyuria.  Genitourinary: Negative for frequency, hematuria, missed menses, pelvic pain, urgency and vaginal discharge.  Musculoskeletal: Negative.  Negative for back pain and joint pain.       Pain to right 3rd finger  Skin: Negative.  Negative for rash.       Viral warts to left hand and left foot  Allergic/Immunologic: Negative.   Neurological: Negative.  Negative for headaches.  Hematological: Negative.   Psychiatric/Behavioral: Negative.         Objective:   Physical Exam  Constitutional: She is oriented to person, place, and time.  HENT:  Head: Normocephalic and atraumatic.  Right Ear: External ear normal.  Left Ear: External ear normal.  Mouth/Throat: Oropharynx is clear and moist.  Eyes: Conjunctivae and EOM are normal. Pupils are equal, round, and reactive to light.  Neck: Normal range of motion. Neck supple.  Pulmonary/Chest: Effort normal and breath sounds normal.  Abdominal: Soft.  Genitourinary: Vagina normal and uterus normal. Cervix exhibits no motion tenderness, no discharge and no friability. Right adnexum displays no tenderness. Left adnexum displays no tenderness. No erythema or tenderness in the vagina. No signs of injury around the vagina. No vaginal discharge found.  Musculoskeletal: Normal range of motion.  Neurological: She is alert and oriented to person, place, and time. She has normal reflexes.  Skin: Skin is warm  and dry.  Left hand warts and left foot warts. Hyperpigmented discoloration, round, raised, hard, and tender to palpation. Swelling to right 3rd finger, warm to touch.   Psychiatric: She has a normal mood and affect. Her  behavior is normal. Judgment and thought content normal.      BP 131/73 (BP Location: Left Arm, Patient Position: Sitting, Cuff Size: Normal)   Pulse (!) 113   Temp 99 F (37.2 C) (Oral)   Resp 16   Ht 5' (1.524 m)   Wt 142 lb (64.4 kg)   LMP 05/18/2016   SpO2 100%   BMI 27.73 kg/m  Assessment & Plan:  1. Pap smear, as part of routine gynecological examination - Cytology - PAP [place] - POCT urinalysis dip (device)  2. Chronic hand pain, left - gabapentin (NEURONTIN) 300 MG capsule; Take 1 capsule (300 mg total) by mouth 3 (three) times daily.  Dispense: 90 capsule; Refill: 1 - Elastic Bandages & Supports (B & B CARPAL TUNNEL BRACE) MISC; 1 each by Does not apply route daily.  Dispense: 1 each; Refill: 0  3. Neuropathy - amitriptyline (ELAVIL) 100 MG tablet; Take 1 tablet (100 mg total) by mouth at bedtime.  Dispense: 30 tablet; Refill: 1  4. Breast cancer screening - MM Digital Screening; Future  5. Tobacco dependence Smoking cessation instruction/counseling given:  counseled patient on the dangers of tobacco use, advised patient to stop smoking, and reviewed strategies to maximize success  RTC: 1 month for chronic hand pain  Donia Pounds  MSN, FNP-C [address] [place], [place]  [phone]  Medical records request.

## 2016-06-20 NOTE — Patient Instructions (Addendum)
Will continue Amitriptyline 100 mg at bedtime Will re-start gabapentin 300 mg three times per day for nerve pain. Refrain from drinking alcohol, driving,or operating machinery while taking this medication.   Apply carpal tunnel brace to left wrist as needed   Cancer Screening for Women A cancer screening is a test or exam that checks for cancer. Your health care provider will recommend specific cancer screenings based on your age, personal history, and family history of cancer. Work with your health care provider to create a cancer screening schedule that protects your health. Why is cancer screening done? Cancer screening is done to look for cancer in the very early stages, before it spreads and becomes harder to treat and before you would start to notice symptoms. Finding cancer early improves the chances of successful treatment. It may save your life. Who should be screened for cancer? All women should be screened for certain cancers, including breast cancer, cervical cancer, and skin cancer. Your health care provider may recommend screenings for other types of cancer if:  You had cancer before.  You have a family member with cancer.  You have abnormal genes that could increase the risk of cancer.  You have risk factors for certain cancers, such as smoking.  When you should be screened for cancer depends on:  Your age.  Your medical history and your family's medical history.  Certain lifestyle factors, such as smoking.  Environmental exposure, such as to asbestos.  What are some common cancer screenings? Breast cancer Breast cancer screening is done with a test that takes images of breast tissue (mammogram). Here are some screening guidelines:  When you are age 35-44. you will be given the choice to start having mammograms.  When you are age 90-54, you should have a mammogram every year.  You may start having mammograms before age 8 if you have risk factors for breast  cancer, such as having an immediate family member with breast cancer.  When you are age 50 or older, you should have a mammogram every 1-2 years for as long as you are in good health and have a life expectancy of 10 years or more.  It is important to know what your breasts look and feel like so you can report any changes to your health care provider.  Cervical cancer Cervical cancer screening is done with a Pap test. This testchecks for abnormalities, including the virus that causes cervical cancer (human papillomavirus, or HPV). To perform the test, a health care provider takes a swab of cervical cells during a pelvic exam. Screening for cervical cancer with a Pap test should start at age 82. Here are some screening guidelines:  When you are age 72-29, you should have a Pap test every 3 years.  When you are age 46-65, you should have a Pap test and HPV test every 5 years or have a Pap test every 3 years.  You may be screened for cervical cancer more often if you have risk factors for cervical cancer.  If your Pap tests are abnormal, you may have an HPV test.  If you have had the HPV vaccine, you will still be screened for cervical cancer and follow normal screening recommendations.  You do not need to be screened for cervical cancer if any of the following apply to you:  You are older than age 40 and you have not had a serious cervical precancer or cancer in the last 20 years.  Your cervix and uterus have been  removed and you have never had cervical cancer or precancerous cells.  Endometrial cancer There is no standard screening test for endometrial cancer, but the cancer can be detected with:  A test of a sample of tissue taken from the lining of the uterus (endometrial tissue biopsy).  A vaginal ultrasound.  Pap tests.  If you are at increased risk for endometrial cancer, you may need to have these tests more often than normal. You are at increased risk if:  You have a family  history of ovarian, uterine, or colon cancer.  You are taking tamoxifen, a drug that is used to treat breast cancer.  You have certain types of colon cancer.  If you have reached menopause, it is especially important to talk with your health care provider about any vaginal bleeding or spotting. Screening for endometrial cancer is not recommended for women who do not have symptoms of the cancer, such as vaginal bleeding. Colorectal cancer Screening for colorectal cancer is recommended starting at age 20 for most women. If you have a family history of colon or rectal cancer or other risk factors, you may need to start having screenings earlier. Talk with your health care provider about which screening test is right for you and how often you should be screened. Colorectal cancer screening looks for cancer or for growths called polyps that often form before cancer starts. Tests to look for cancer or polyps include:  Colonoscopy or flexible sigmoidoscopy. For these procedures, a flexible tube with a small camera is inserted into the rectum.  CT colonography. This test uses X-rays and a contrast dye to check the colon for polyps. If a polyp is found, you may need to have a colonoscopy so the polyp can be located and removed.  Tests to look for cancer in the stool (feces) include:  Guaiac-based fecal occult blood test (FOBT). This test detects blood in stool. It can be done at home with a kit.  Fecal immunochemical test (FIT). This test detects blood in stool. For this test, you will need to collect stool samples at home.  Stool DNA test. This test looks for blood in stool and any changes in DNA that can lead to colon cancer. For this test, you will need to collect a stool sample at home and send it to a lab.  Skin cancer Skin cancer screening is done by checking the skin for unusual moles or spots and any changes in existing moles. Your health care provider should check your skin for signs of skin  cancer at every physical exam. You should check your skin every month and tell your health care provider right away if anything looks unusual. Women with a higher-than-normal risk for skin cancer may want to see a skin specialist (dermatologist) for an annual body check. Lung cancer Lung cancer screening is done with a CT scan that looks for abnormal cells in the lungs. Discuss lung cancer screening with your health care provider if you are 104-45 years old and if any of the following apply to you:  You currently smoke.  You used to smoke heavily.  You have had at least a 30-pack-year smoking history.  You have quit smoking within the past 15 years.  If you smoke heavily or if you used to smoke, you may need to be screened every year. Where to find more information:  [place]: SkinPromotion.no  Centers for Disease Control and Prevention: http://knight-sullivan.biz/  Department of Health and Human Services: BankingDetective.si Contact a  health care provider if:  You have concerns about any signs or symptoms of cancer, such as: ? Moles that have an unusual shape or color. ? Changes in existing moles. ? A sore on your skin that does not heal. ? Blood in your stool. ? Fatigue that does not go away. ? Frequent pain or cramping in your abdomen. ? Coughing, or coughing up blood. ? Losing weight without trying. ? Lumps or other changes in your breasts. ? Vaginal bleeding, spotting, or changes in your periods. Summary  Be aware of and watch for signs and symptoms of cancer, especially symptoms of breast cancer, cervical cancer, endometrial cancer, colorectal cancer, skin cancer, and lung cancer.  Early detection of cancer with cancer screening may save your life.  Talk with your health care provider about your specific cancer risks.  Work together  with your health care provider to create a cancer screening plan that is right for you. This information is not intended to replace advice given to you by your health care provider. Make sure you discuss any questions you have with your health care provider. Document Released: 09/27/2015 Document Revised: 09/27/2015 Document Reviewed: 09/27/2015 Elsevier Interactive Patient Education  2018 [employer]. Pap Test Why am I having this test? A pap test is sometimes called a pap smear. It is a screening test that is used to check for signs of cancer of the vagina, cervix, and uterus. The test can also identify the presence of infection or precancerous changes. Your health care provider will likely recommend you have this test done on a regular basis. This test may be done:  Every 3 years, starting at age 24.  Every 5 years, in combination with testing for the presence of human papillomavirus (HPV).  More or less often depending on other medical conditions.  What kind of sample is taken? Using a small cotton swab, plastic spatula, or brush, your health care provider will collect a sample of cells from the surface of your cervix. Your cervix is the opening to your uterus, also called a womb. Secretions from the cervix and vagina may also be collected. How do I prepare for this test?  Be aware of where you are in your menstrual cycle. You may be asked to reschedule the test if you are menstruating on the day of the test.  You may need to reschedule if you have a known vaginal infection on the day of the test.  You may be asked to avoid douching or taking a bath the day before or the day of the test.  Some medicines can cause abnormal test results, such as digitalis and tetracycline. Talk with your health care provider before your test if you take one of these medicines. What do the results mean? Abnormal test results may indicate a number of health conditions. These may include:  Cancer.  Although pap test results cannot be used to diagnose cancer of the cervix, vagina, or uterus, they may suggest the possibility of cancer. Further tests would be required to determine if cancer is present.  Sexually transmitted disease.  Fungal infection.  Parasite infection.  Herpes infection.  A condition causing or contributing to infertility.  It is your responsibility to obtain your test results. Ask the lab or department performing the test when and how you will get your results. Contact your health care provider to discuss any questions you have about your results. Talk with your health care provider to discuss your results, treatment options, and if  necessary, the need for more tests. Talk with your health care provider if you have any questions about your results. This information is not intended to replace advice given to you by your health care provider. Make sure you discuss any questions you have with your health care provider. Document Released: 03/22/2002 Document Revised: 09/05/2015 Document Reviewed: 05/23/2013 Elsevier Interactive Patient Education  [employer].

## 2016-06-24 LAB — CYTOLOGY - PAP
Bacterial vaginitis: POSITIVE — AB
Candida vaginitis: NEGATIVE
Chlamydia: NEGATIVE
DIAGNOSIS: NEGATIVE
Neisseria Gonorrhea: NEGATIVE

## 2016-06-26 ENCOUNTER — Other Ambulatory Visit: Payer: Self-pay | Admitting: Family Medicine

## 2016-06-26 ENCOUNTER — Telehealth: Payer: Self-pay

## 2016-06-26 DIAGNOSIS — A5901 Trichomonal vulvovaginitis: Secondary | ICD-10-CM

## 2016-06-26 DIAGNOSIS — B9689 Other specified bacterial agents as the cause of diseases classified elsewhere: Secondary | ICD-10-CM

## 2016-06-26 DIAGNOSIS — N760 Acute vaginitis: Secondary | ICD-10-CM

## 2016-06-26 MED ORDER — METRONIDAZOLE 500 MG PO TABS: 500.0000 mg | ORAL_TABLET | Freq: Two times a day (BID) | ORAL | 0 refills | Status: DC

## 2016-06-26 NOTE — Progress Notes (Signed)
Meds ordered this encounter  Medications  . metroNIDAZOLE (FLAGYL) 500 MG tablet    Sig: Take 1 tablet (500 mg total) by mouth 2 (two) times daily.    Dispense:  14 tablet    Refill:  0     Donia Pounds  MSN, FNP-C [facility] [address] [place], [place] [address] [phone]

## 2016-06-26 NOTE — Telephone Encounter (Signed)
-----   Message from [name], [place] sent at 06/26/2016  8:46 AM EDT ----- Regarding: lab resutls Please inform Ms. Monier that vaginal sample yielded trichomonas vaginitis and bacterial vaginitis. 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 Will treat with Metronidazole 500 mg every 12 hours for 7 days. Refrain from alcohol use while taking this medication. Also recommend barrier protection with sexual intercourse.   Pap smear was negative. Will repeat in 1 year.    Thanks

## 2016-06-26 NOTE — Telephone Encounter (Signed)
Called, no answer. Left message for patient to return call and left call back number. Thanks!

## 2016-06-27 ENCOUNTER — Telehealth: Payer: Self-pay

## 2016-06-27 NOTE — Telephone Encounter (Signed)
Called patient, advised of pap negative besides trichomonas and bacterial vaginitis. Advised that this is a a parasite that is spread by sexual contact. Advised that she will need to take Metronidazole 500mg  every 12 hours for 7 days, use barrier protection with sexual intercourse, and avoid alcohol while taking medication. Informed that pap should be repeated in 1 year. Patient verbalized understanding and I informed that medication has been sent into pharmacy. Thanks!

## 2016-06-27 NOTE — Telephone Encounter (Signed)
I returned call, see next message from today. Thanks!

## 2016-06-27 NOTE — Telephone Encounter (Signed)
Called, no answer. Left a message for patient to return call. Thanks!  

## 2016-07-25 ENCOUNTER — Ambulatory Visit (INDEPENDENT_AMBULATORY_CARE_PROVIDER_SITE_OTHER): Payer: Medicaid Other | Admitting: Family Medicine

## 2016-07-25 ENCOUNTER — Other Ambulatory Visit (HOSPITAL_COMMUNITY)
Admission: RE | Admit: 2016-07-25 | Discharge: 2016-07-25 | Disposition: A | Discharge status: Home / Self Care | Payer: Medicaid Other | Source: Ambulatory Visit | Attending: Family Medicine | Admitting: Family Medicine

## 2016-07-25 ENCOUNTER — Encounter: Payer: Self-pay | Admitting: Family Medicine

## 2016-07-25 VITALS — BP 130/78 | HR 100 | Temp 98.5°F | Resp 16 | Ht 60.0 in | Wt 150.2 lb

## 2016-07-25 DIAGNOSIS — G8929 Other chronic pain: Secondary | ICD-10-CM

## 2016-07-25 DIAGNOSIS — G629 Polyneuropathy, unspecified: Secondary | ICD-10-CM

## 2016-07-25 DIAGNOSIS — N898 Other specified noninflammatory disorders of vagina: Secondary | ICD-10-CM

## 2016-07-25 DIAGNOSIS — F17200 Nicotine dependence, unspecified, uncomplicated: Secondary | ICD-10-CM

## 2016-07-25 DIAGNOSIS — N951 Menopausal and female climacteric states: Secondary | ICD-10-CM

## 2016-07-25 DIAGNOSIS — K5900 Constipation, unspecified: Secondary | ICD-10-CM | POA: Diagnosis not present

## 2016-07-25 DIAGNOSIS — M79642 Pain in left hand: Secondary | ICD-10-CM | POA: Diagnosis not present

## 2016-07-25 LAB — POCT URINALYSIS DIP (DEVICE)
Bilirubin Urine: NEGATIVE
GLUCOSE, UA: NEGATIVE mg/dL
KETONES UR: NEGATIVE mg/dL
Leukocytes, UA: NEGATIVE
Nitrite: NEGATIVE
Protein, ur: NEGATIVE mg/dL
UROBILINOGEN UA: 0.2 mg/dL (ref 0.0–1.0)
pH: 6 (ref 5.0–8.0)

## 2016-07-25 MED ORDER — GABAPENTIN 400 MG PO CAPS: 400.0000 mg | ORAL_CAPSULE | Freq: Three times a day (TID) | ORAL | 3 refills | Status: DC

## 2016-07-25 MED ORDER — DOCUSATE SODIUM 100 MG PO CAPS: 100.0000 mg | ORAL_CAPSULE | Freq: Every day | ORAL | 11 refills | Status: DC

## 2016-07-25 MED ORDER — BLACK COHOSH 40 MG PO CAPS: 1.0000 | ORAL_CAPSULE | Freq: Every day | ORAL | 0 refills | Status: DC

## 2016-07-25 NOTE — Progress Notes (Signed)
Subjective:    Patient ID: Vanessa Butler, female    DOB: [birth year], 41 y.o.   MRN: [id number]   Vanessa Butler, a 41 year old female with a history of verucca vulgaris of hands and feet and peripheral neuropathy presents complaining of worsening neuropathy.   She was started on Amitriptyline  1 month ago for neuropathy primarily to right hand without satisfactory relief.    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 She says that Gabapentin has been moderately  effective in decreasing nerve pain.   She says that she has been under the care of dermatology for the problem. She continues to have warts and her left hand has become painful. She states that left hand pain is interfering with activities of daily living. Current pain intensity is 4-5/10 described as burning and tingling.She was recently referred to dermatology and evaluated on 05/26/2016. She was prescribed urea 20%, she has been applying cream nightly under gloves. She has a follow up appointment scheduled for August 04, 2016.   She says that she has been having abnormal vaginal discharge. She has been having brown vaginal discharge over the past week. She says that menstrual cycles have been abnormal over the past several weeks. She endorses hot flashes. She denies vaginal itching, dyspareunia, dysuria, abdominal pain, or vaginal itching. She is sexually active with her husband.    Past Medical History:  Diagnosis Date  . Skin disorder    Immunization History  Administered Date(s) Administered  . Pneumococcal Polysaccharide-23 02/27/2016  . Tdap 02/27/2016   Review of Systems  Constitutional: Negative.  Negative for chills, fever and unexpected weight change.  HENT: Negative.  Negative for sore throat.   Eyes: Negative.  Negative for visual disturbance.  Respiratory: Negative.   Cardiovascular: Negative.  Negative for chest pain, palpitations and leg swelling.  Gastrointestinal: Positive for  constipation and vomiting (occasional). Negative for abdominal pain, anorexia, diarrhea and nausea.  Endocrine: Negative.  Negative for polydipsia, polyphagia and polyuria.  Genitourinary: Positive for vaginal discharge. Negative for frequency, hematuria, missed menses, pelvic pain, urgency and vaginal pain.  Musculoskeletal: Negative for joint pain.       Pain to right 3rd finger  Skin: Negative.  Negative for rash.       Viral warts to left hand and left foot  Allergic/Immunologic: Negative.   Neurological: Negative.  Negative for headaches.  Hematological: Negative.   Psychiatric/Behavioral: Negative.         Objective:   Physical Exam  Constitutional: She is oriented to person, place, and time.  HENT:  Head: Normocephalic and atraumatic.  Right Ear: External ear normal.  Left Ear: External ear normal.  Mouth/Throat: Oropharynx is clear and moist.  Eyes: Pupils are equal, round, and reactive to light. Conjunctivae and EOM are normal.  Neck: Normal range of motion. Neck supple.  Pulmonary/Chest: Effort normal and breath sounds normal.  Abdominal: Soft.  Genitourinary: Vagina normal and uterus normal. Cervix exhibits no motion tenderness, no discharge and no friability. Right adnexum displays no tenderness. Left adnexum displays no tenderness. No erythema or tenderness in the vagina. No signs of injury around the vagina. No vaginal discharge found.  Musculoskeletal: Normal range of motion.  Neurological: She is alert and oriented to person, place, and time. She has normal reflexes.  Skin: Skin is warm and dry.  Left hand warts and left foot warts. Hyperpigmented discoloration, round, raised, hard, and tender to palpation. Swelling to right  3rd finger, warm to touch.   Psychiatric: She has a normal mood and affect. Her behavior is normal. Judgment and thought content normal.      BP 130/78 (BP Location: Right Arm, Patient Position: Sitting, Cuff Size: Small)   Pulse 100   Temp  98.5 F (36.9 C) (Oral)   Resp 16   Ht 5' (1.524 m)   Wt 150 lb 3.2 oz (68.1 kg)   LMP 06/19/2016   SpO2 100%   BMI 29.33 kg/m  Assessment & Plan:    1. Chronic hand pain, left Will increase gabapentin to 400 mg TID. Vanessa Butler is to follow up with dermatology for vaginal warts as scheduled.  - gabapentin (NEURONTIN) 400 MG capsule; Take 1 capsule (400 mg total) by mouth 3 (three) times daily.  Dispense: 90 capsule; Refill: 3  2. Neuropathy  - gabapentin (NEURONTIN) 400 MG capsule; Take 1 capsule (400 mg total) by mouth 3 (three) times daily.  Dispense: 90 capsule; Refill: 3  3. Vaginal discharge She c/o copious vaginal discharge. Will send sample to cytology and follow up by phone as results become available.  - Cervicovaginal ancillary only  4. Hot flashes due to menopause Discussed menopause at length. Vanessa Butler was given written information, discussed at length.  - Black Cohosh (BLACK COHOSH HOT FLASH RELIEF) 40 MG CAPS; Take 1 capsule (40 mg total) by mouth daily.  Dispense: 30 each; Refill: 0  5. Constipation, unspecified constipation type A high fiber diet with plenty of fluids (up to 8 glasses of water daily) is suggested to relieve these symptoms.   - docusate sodium (COLACE) 100 MG capsule; Take 1 capsule (100 mg total) by mouth daily.  Dispense: 30 capsule; Refill: 11  6. Tobacco dependence Smoking cessation instruction/counseling given:  counseled patient on the dangers of tobacco use, advised patient to stop smoking, and reviewed strategies to maximize success   RTC: 3 months for neuropathy   Donia Pounds  MSN, FNP-C [place], [place]  [id number] [phone]  Medical records request.

## 2016-07-25 NOTE — Patient Instructions (Addendum)
Neuropathy Will discontinue Amitriptyline.  Will increase Gabapentin to 400 mg three times per day for neuropathy.   Weight gain:  Recommend a lowfat, low carbohydrate diet divided over 5-6 small meals, increase water intake to 6-8 glasses, and 150 minutes per week of cardiovascular exercise.    Menopause: Black Cohosh 40 mg daily Menopause is the time that marks the end of your menstrual cycles. It's diagnosed after you've gone 12 months without a menstrual period.  Menopause can happen in your 80s or 55s, but the average age is 8 in the [place]. Menopause is a natural biological process. But the physical symptoms, such as hot flashes, and emotional symptoms of menopause may disrupt your sleep, lower your energy or affect emotional health.  There are many effective treatments available, from lifestyle adjustments to hormone therapy. Cool hot flashes. Dress in layers, have a cold glass of water or go somewhere cooler. Try to pinpoint what triggers your hot flashes. For many women, triggers may include hot beverages, caffeine, spicy foods, alcohol, stress, hot weather and even a warm room.  Decrease vaginal discomfort. Use over-the-counter, water-based vaginal lubricants (Astroglide, K-Y jelly, others), silicone-based lubricants or moisturizers (Replens, others). Choose products that don't contain glycerin, which can cause burning or irritation in women who are sensitive to that chemical. Staying sexually active also helps by increasing blood flow to the vagina.  Get enough sleep. Avoid caffeine, which can make it hard to get to sleep, and avoid drinking too much alcohol, which can interrupt sleep. Exercise during the day, although not right before bedtime. If hot flashes disturb your sleep, you may need to find a way to manage them before you can get adequate rest.  Practice relaxation techniques. Techniques such as deep breathing, paced breathing, guided imagery, massage and progressive  muscle relaxation may help with menopausal symptoms. You can find a number of books, CDs and online offerings on different relaxation exercises.  Strengthen your pelvic floor. Pelvic floor muscle exercises, called Kegel exercises, can improve some forms of urinary incontinence.  Eat a balanced diet. Include a variety of fruits, vegetables and whole grains. Limit saturated fats, oils and sugars. Ask your provider if you need calcium or vitamin D supplements to help meet daily requirements.  Don't smoke. Smoking increases your risk of heart disease, stroke, osteoporosis, cancer and a range of other health problems. It may also increase hot flashes and bring on earlier menopause.  Exercise regularly. Get regular physical activity or exercise on most days to help protect against heart disease, diabetes, osteoporosis and other conditions associated with aging.   Constipation  Colace 100 mg daily. A high fiber diet with plenty of fluids (up to 8 glasses of water daily) is suggested to relieve these symptoms.

## 2016-07-28 LAB — CERVICOVAGINAL ANCILLARY ONLY: Wet Prep (BD Affirm): NEGATIVE

## 2016-08-01 ENCOUNTER — Telehealth: Payer: Self-pay

## 2016-08-01 NOTE — Telephone Encounter (Signed)
Called and spoke with patient advised of negative testing for yeast and bacteria and to make appointment if she continues to have the problem. Patient verbalized understanding. Thanks!

## 2016-08-01 NOTE — Telephone Encounter (Signed)
-----   Message from [name], [place] sent at 07/30/2016  2:15 PM EDT ----- Regarding: lab results Please inform patient that wet prep was negative for yeast or bacterial presence. Please schedule appt if problem persists.   Thanks ----- Message ----- From: Interface, Lab In [facility] Sent: 07/28/2016   5:11 PM To: [name], FNP

## 2016-08-01 NOTE — Telephone Encounter (Signed)
Called, no answer on mobile number, and no voicemail.   Called house no answer. Left message for patient to call back and left call back number. Thanks!

## 2016-08-04 ENCOUNTER — Ambulatory Visit: Payer: Medicaid Other | Admitting: Family Medicine

## 2016-08-05 ENCOUNTER — Other Ambulatory Visit: Payer: Self-pay | Admitting: Family Medicine

## 2016-08-06 ENCOUNTER — Other Ambulatory Visit: Payer: Self-pay | Admitting: Family Medicine

## 2016-08-06 DIAGNOSIS — M79642 Pain in left hand: Principal | ICD-10-CM

## 2016-08-06 DIAGNOSIS — G8929 Other chronic pain: Secondary | ICD-10-CM

## 2016-08-06 MED ORDER — TRAMADOL HCL 50 MG PO TABS: 50.0000 mg | ORAL_TABLET | Freq: Four times a day (QID) | ORAL | 0 refills | Status: DC | PRN

## 2016-08-06 NOTE — Progress Notes (Signed)
Reviewed [place] Substance Reporting system prior to prescribing opiate medications. No inconsistencies noted.   Meds ordered this encounter  Medications  . traMADol (ULTRAM) 50 MG tablet    Sig: Take 1 tablet (50 mg total) by mouth every 6 (six) hours as needed.    Dispense:  30 tablet    Refill:  0    Order Specific Question:   Supervising Provider    Answer:   Tresa Garter [[id number]]    Donia Pounds  MSN, FNP-C [facility] [address] [place], [place] [address] [phone]

## 2016-08-06 NOTE — Progress Notes (Signed)
Patient called and I advised that we are starting the trial of Tramadol 50 mg one tablet every 6 hours as needed for pain. Patient was informed to come pick up the rx from our office. Patient verbalized understanding. Thanks!

## 2016-09-04 ENCOUNTER — Ambulatory Visit (INDEPENDENT_AMBULATORY_CARE_PROVIDER_SITE_OTHER): Payer: Medicaid Other | Admitting: Family Medicine

## 2016-09-04 ENCOUNTER — Ambulatory Visit (HOSPITAL_COMMUNITY)
Admission: RE | Admit: 2016-09-04 | Discharge: 2016-09-04 | Disposition: A | Discharge status: Home / Self Care | Payer: Medicaid Other | Source: Ambulatory Visit | Attending: Family Medicine | Admitting: Family Medicine

## 2016-09-04 ENCOUNTER — Encounter: Payer: Self-pay | Admitting: Family Medicine

## 2016-09-04 VITALS — BP 111/65 | HR 90 | Temp 98.6°F | Resp 14 | Ht 60.0 in | Wt 142.0 lb

## 2016-09-04 DIAGNOSIS — G629 Polyneuropathy, unspecified: Secondary | ICD-10-CM | POA: Diagnosis not present

## 2016-09-04 DIAGNOSIS — R079 Chest pain, unspecified: Secondary | ICD-10-CM | POA: Diagnosis not present

## 2016-09-04 DIAGNOSIS — F17200 Nicotine dependence, unspecified, uncomplicated: Secondary | ICD-10-CM

## 2016-09-04 DIAGNOSIS — R05 Cough: Secondary | ICD-10-CM

## 2016-09-04 DIAGNOSIS — R053 Chronic cough: Secondary | ICD-10-CM

## 2016-09-04 LAB — POCT GLYCOSYLATED HEMOGLOBIN (HGB A1C): HEMOGLOBIN A1C: 5.6

## 2016-09-04 NOTE — Patient Instructions (Signed)
Peripheral Neuropathy Peripheral neuropathy is a type of nerve damage. It affects nerves that carry signals between the spinal cord and other parts of the body. These are called peripheral nerves. With peripheral neuropathy, one nerve or a group of nerves may be damaged. What are the causes? Many things can damage peripheral nerves. For some people with peripheral neuropathy, the cause is unknown. Some causes include:  Diabetes. This is the most common cause of peripheral neuropathy.  Injury to a nerve.  Pressure or stress on a nerve that lasts a long time.  Too little vitamin B. Alcoholism can lead to this.  Infections.  Autoimmune diseases, such as multiple sclerosis and systemic lupus erythematosus.  Inherited nerve diseases.  Some medicines, such as cancer drugs.  Toxic substances, such as lead and mercury.  Too little blood flowing to the legs.  Kidney disease.  Thyroid disease.  What are the signs or symptoms? Different people have different symptoms. The symptoms you have will depend on which of your nerves is damaged. Common symptoms include:  Loss of feeling (numbness) in the feet and hands.  Tingling in the feet and hands.  Pain that burns.  Very sensitive skin.  Weakness.  Not being able to move a part of the body (paralysis).  Muscle twitching.  Clumsiness or poor coordination.  Loss of balance.  Not being able to control your bladder.  Feeling dizzy.  Sexual problems.  How is this diagnosed? Peripheral neuropathy is a symptom, not a disease. Finding the cause of peripheral neuropathy can be hard. To figure that out, your health care provider will take a medical history and do a physical exam. A neurological exam will also be done. This involves checking things affected by your brain, spinal cord, and nerves (nervous system). For example, your health care provider will check your reflexes, how you move, and what you can feel. Other types of tests  may also be ordered, such as:  Blood tests.  A test of the fluid in your spinal cord.  Imaging tests, such as CT scans or an MRI.  Electromyography (EMG). This test checks the nerves that control muscles.  Nerve conduction velocity tests. These tests check how fast messages pass through your nerves.  Nerve biopsy. A small piece of nerve is removed. It is then checked under a microscope.  How is this treated?  Medicine is often used to treat peripheral neuropathy. Medicines may include: ? Pain-relieving medicines. Prescription or over-the-counter medicine may be suggested. ? Antiseizure medicine. This may be used for pain. ? Antidepressants. These also may help ease pain from neuropathy. ? Lidocaine. This is a numbing medicine. You might wear a patch or be given a shot. ? Mexiletine. This medicine is typically used to help control irregular heart rhythms.  Surgery. Surgery may be needed to relieve pressure on a nerve or to destroy a nerve that is causing pain.  Physical therapy to help movement.  Assistive devices to help movement. Follow these instructions at home:  Only take over-the-counter or prescription medicines as directed by your health care provider. Follow the instructions carefully for any given medicines. Do not take any other medicines without first getting approval from your health care provider.  If you have diabetes, work closely with your health care provider to keep your blood sugar under control.  If you have numbness in your feet: ? Check every day for signs of injury or infection. Watch for redness, warmth, and swelling. ? Wear padded socks and comfortable   shoes. These help protect your feet.  Do not do things that put pressure on your damaged nerve.  Do not smoke. Smoking keeps blood from getting to damaged nerves.  Avoid or limit alcohol. Too much alcohol can cause a lack of B vitamins. These vitamins are needed for healthy nerves.  Develop a good  support system. Coping with peripheral neuropathy can be stressful. Talk to a mental health specialist or join a support group if you are struggling.  Follow up with your health care provider as directed. Contact a health care provider if:  You have new signs or symptoms of peripheral neuropathy.  You are struggling emotionally from dealing with peripheral neuropathy.  You have a fever. Get help right away if:  You have an injury or infection that is not healing.  You feel very dizzy or begin vomiting.  You have chest pain.  You have trouble breathing. This information is not intended to replace advice given to you by your health care provider. Make sure you discuss any questions you have with your health care provider. Document Released: 12/20/2001 Document Revised: 06/07/2015 Document Reviewed: 09/06/2012 Elsevier Interactive Patient Education  2017 [employer] Nonspecific Chest Pain Chest pain can be caused by many different conditions. There is a chance that your pain could be related to something serious, such as a heart attack or a blood clot in your lungs. Chest pain can also be caused by conditions that are not life-threatening. If you have chest pain, it is very important to follow up with your doctor. Follow these instructions at home: Medicines  If you were prescribed an antibiotic medicine, take it as told by your doctor. Do not stop taking the antibiotic even if you start to feel better.  Take over-the-counter and prescription medicines only as told by your doctor. Lifestyle  Do not use any products that contain nicotine or tobacco, such as cigarettes and e-cigarettes. If you need help quitting, ask your doctor.  Do not drink alcohol.  Make lifestyle changes as told by your doctor. These may include: ? Getting regular exercise. Ask your doctor for some activities that are safe for you. ? Eating a heart-healthy diet. A diet specialist (dietitian) can help you to  learn healthy eating options. ? Staying at a healthy weight. ? Managing diabetes, if needed. ? Lowering your stress, as with deep breathing or spending time in nature. General instructions  Avoid any activities that make you feel chest pain.  If your chest pain is because of heartburn: ? Raise (elevate) the head of your bed about 6 inches (15 cm). You can do this by putting blocks under the bed legs at the head of the bed. ? Do not sleep with extra pillows under your head. That does not help heartburn.  Keep all follow-up visits as told by your doctor. This is important. This includes any further testing if your chest pain does not go away. Contact a doctor if:  Your chest pain does not go away.  You have a rash with blisters on your chest.  You have a fever.  You have chills. Get help right away if:  Your chest pain is worse.  You have a cough that gets worse, or you cough up blood.  You have very bad (severe) pain in your belly (abdomen).  You are very weak.  You pass out (faint).  You have either of these for no clear reason: ? Sudden chest discomfort. ? Sudden discomfort in your arms, back,  neck, or jaw.  You have shortness of breath at any time.  You suddenly start to sweat, or your skin gets clammy.  You feel sick to your stomach (nauseous).  You throw up (vomit).  You suddenly feel light-headed or dizzy.  Your heart starts to beat fast, or it feels like it is skipping beats. These symptoms may be an emergency. Do not wait to see if the symptoms will go away. Get medical help right away. Call your local emergency services (911 in the [place] Do not drive yourself to the hospital. This information is not intended to replace advice given to you by your health care provider. Make sure you discuss any questions you have with your health care provider. Document Released: 06/18/2007 Document Revised: 09/24/2015 Document Reviewed: 09/24/2015 Elsevier Interactive  Patient Education  2017 [employer].

## 2016-09-04 NOTE — Progress Notes (Signed)
Subjective:    Vanessa Butler is a 41 y.o. right handed female presents for evaluation of ongoing neuropathy. She describes symptoms of numbness and tingling to right hand, which is a new problem. Onset of symptoms was sudden, not related to any specific activity. Symptoms are currently of mild severity. Symptoms occur intermittently. She denies fatigue, fever, polyuria, polydipsia or polyphagia.She continued gabapentin for this problem without relief. Henretter endorses chest pain. Chest pain occurs intermittently. She is a chronic everyday smoker. She smokes 2-3 cigarettes per day. Pain is localized to left chest and does not radiate. She is not having chest pains at this time.  Associated symptoms are dyspnea and fatigue. Aggravating factors are coughing.  Alleviating factors are: rest. Patient's cardiac risk factors are sedentary lifestyle and smoking/ tobacco exposure.   Past Medical History:  Diagnosis Date  . Skin disorder    Social History   Social History  . Marital status: Single    Spouse name: N/A  . Number of children: N/A  . Years of education: N/A   Occupational History  . Not on file.   Social History Main Topics  . Smoking status: Current Some Day Smoker    Packs/day: 1.00    Types: Cigarettes  . Smokeless tobacco: Never Used  . Alcohol use Yes     Comment: occ  . Drug use: No  . Sexual activity: Not on file   Other Topics Concern  . Not on file   Social History Narrative  . No narrative on file   Immunization History  Administered Date(s) Administered  . Pneumococcal Polysaccharide-23 02/27/2016  . Tdap 02/27/2016    Review of Syst Review of Systems  Constitutional: Negative.   HENT: Negative.   Eyes: Negative.   Respiratory: Negative.   Cardiovascular: Positive for chest pain. Negative for claudication and leg swelling.  Gastrointestinal: Negative.   Genitourinary: Negative.   Musculoskeletal: Negative.   Skin: Negative.   Neurological: Negative.    Psychiatric/Behavioral: Negative.      Objective:  Physical Exam  Constitutional: She is oriented to person, place, and time. She appears well-developed and well-nourished.  HENT:  Head: Normocephalic and atraumatic.  Right Ear: External ear normal.  Left Ear: External ear normal.  Nose: Nose normal.  Eyes: Pupils are equal, round, and reactive to light. Conjunctivae and EOM are normal.  Neck: Normal range of motion. Neck supple.  Cardiovascular: Intact distal pulses.   Respiratory: Effort normal and breath sounds normal.  GI: Soft. Bowel sounds are normal.  Musculoskeletal: Normal range of motion.  Neurological: She is alert and oriented to person, place, and time. She displays no tremor. No sensory deficit. She exhibits normal muscle tone. Coordination and gait normal.  Reflex Scores:      Tricep reflexes are 2+ on the right side and 2+ on the left side.      Bicep reflexes are 2+ on the right side and 2+ on the left side.      Brachioradialis reflexes are 2+ on the right side and 2+ on the left side.      Achilles reflexes are 2+ on the right side and 2+ on the left side. Skin: Skin is warm and dry.   Assessment:  BP 111/65 (BP Location: Right Arm, Patient Position: Sitting, Cuff Size: Normal)   Pulse 90   Temp 98.6 F (37 C) (Oral)   Resp 14   Ht 5' (1.524 m)   Wt 142 lb (64.4 kg)   LMP 08/14/2016  SpO2 100%   BMI 27.73 kg/m    Plan:  1. Neuropathy Hemoglobin a1C is within a normal range.  Will continue gabapentin 400 mg every 8 hours as previously prescribed - HgB A1c  2. Chest pain in adult Reviewed EKG, normal sinus rhythm.  Recommend smoking cessation.  - EKG 12-Lead - DG Chest 2 View; Future - COMPLETE METABOLIC PANEL WITH GFR  3. Tobacco dependence Smoking cessation instruction/counseling given:  counseled patient on the dangers of tobacco use, advised patient to stop smoking, and reviewed strategies to maximize success - Lipid Panel  4. Cough,  persistent - DG Chest 2 View; Future   RTC; Will follow up by phone after reviewing chest xray   Donia Pounds  MSN, FNP-C [facility] [address] [place], [place] [address] [phone]

## 2016-09-05 LAB — COMPLETE METABOLIC PANEL WITH GFR
ALT: 11 U/L (ref 6–29)
AST: 13 U/L (ref 10–30)
Albumin: 4.1 g/dL (ref 3.6–5.1)
Alkaline Phosphatase: 69 U/L (ref 33–115)
BUN: 10 mg/dL (ref 7–25)
CHLORIDE: 108 mmol/L (ref 98–110)
CO2: 18 mmol/L — AB (ref 20–32)
Calcium: 9 mg/dL (ref 8.6–10.2)
Creat: 0.71 mg/dL (ref 0.50–1.10)
GFR, Est African American: >89 mL/min (ref >=60)
GLUCOSE: 82 mg/dL (ref 65–99)
POTASSIUM: 4.3 mmol/L (ref 3.5–5.3)
SODIUM: 138 mmol/L (ref 135–146)
Total Bilirubin: 0.4 mg/dL (ref 0.2–1.2)
Total Protein: 7 g/dL (ref 6.1–8.1)

## 2016-09-05 LAB — LIPID PANEL
CHOL/HDL RATIO: 3.8 ratio (ref <5.0)
Cholesterol: 134 mg/dL (ref <200)
HDL: 35 mg/dL — AB (ref >50)
LDL CALC: 69 mg/dL (ref <100)
Triglycerides: 149 mg/dL (ref <150)
VLDL: 30 mg/dL (ref <30)

## 2016-10-27 ENCOUNTER — Ambulatory Visit: Payer: Self-pay | Admitting: Family Medicine

## 2016-11-17 ENCOUNTER — Encounter: Payer: Self-pay | Admitting: Family Medicine

## 2016-11-17 ENCOUNTER — Ambulatory Visit (INDEPENDENT_AMBULATORY_CARE_PROVIDER_SITE_OTHER): Payer: Medicaid Other | Admitting: Family Medicine

## 2016-11-17 VITALS — BP 110/63 | HR 80 | Temp 98.5°F | Resp 14 | Ht 60.0 in | Wt 140.0 lb

## 2016-11-17 DIAGNOSIS — F17200 Nicotine dependence, unspecified, uncomplicated: Secondary | ICD-10-CM

## 2016-11-17 DIAGNOSIS — N951 Menopausal and female climacteric states: Secondary | ICD-10-CM

## 2016-11-17 MED ORDER — VENLAFAXINE HCL ER 37.5 MG PO CP24: 37.5000 mg | ORAL_CAPSULE | Freq: Every day | ORAL | 5 refills | Status: DC

## 2016-11-17 NOTE — Progress Notes (Signed)
Subjective:  Vanessa Butler, a 41 year old female presents complaining of worsening hot flashes. She says that she has been having hot flashes since she was 41 year old. She has attempted she attempted over-the-counter black cohosh without relief.  Vanessa Butler continues to have normal menstrual cycles.  Her last Pap smear was negative.  She is up-to-date with mammogram.  She is sexually active.  She denies abnormal menstrual cycles, vaginal dryness, decreased libido, or increased fatigue.     Past Medical History:  Diagnosis Date  . Skin disorder    Social History   Socioeconomic History  . Marital status: Single    Spouse name: Not on file  . Number of children: Not on file  . Years of education: Not on file  . Highest education level: Not on file  Social Needs  . Financial resource strain: Not on file  . Food insecurity - worry: Not on file  . Food insecurity - inability: Not on file  . Transportation needs - medical: Not on file  . Transportation needs - non-medical: Not on file  Occupational History  . Not on file  Tobacco Use  . Smoking status: Current Some Day Smoker    Packs/day: 1.00    Types: Cigarettes  . Smokeless tobacco: Never Used  Substance and Sexual Activity  . Alcohol use: Yes    Comment: occ  . Drug use: No  . Sexual activity: Not on file  Other Topics Concern  . Not on file  Social History Narrative  . Not on file   Immunization History  Administered Date(s) Administered  . Pneumococcal Polysaccharide-23 02/27/2016  . Tdap 02/27/2016    Review of Syst Review of Systems  Constitutional: Negative.   HENT: Negative.   Eyes: Negative.   Respiratory: Negative.   Cardiovascular: Positive for chest pain. Negative for claudication and leg swelling.  Gastrointestinal: Negative.   Genitourinary: Negative.   Musculoskeletal: Negative.   Skin: Negative.   Neurological: Negative.   Psychiatric/Behavioral: Negative.      Objective:  Physical Exam   Constitutional: She is oriented to person, place, and time. She appears well-developed and well-nourished.  HENT:  Head: Normocephalic and atraumatic.  Right Ear: External ear normal.  Left Ear: External ear normal.  Nose: Nose normal.  Eyes: Conjunctivae and EOM are normal. Pupils are equal, round, and reactive to light.  Neck: Normal range of motion. Neck supple.  Cardiovascular: Intact distal pulses.  Respiratory: Effort normal and breath sounds normal.  GI: Soft. Bowel sounds are normal.  Musculoskeletal: Normal range of motion.  Neurological: She is alert and oriented to person, place, and time. She displays no tremor. No sensory deficit. She exhibits normal muscle tone. Coordination and gait normal.  Skin: Skin is warm and dry.   Assessment:  BP 110/63 (BP Location: Right Arm, Patient Position: Sitting, Cuff Size: Normal)   Pulse 80   Temp 98.5 F (36.9 C) (Oral)   Resp 14   Ht 5' (1.524 m)   Wt 140 lb (63.5 kg)   LMP 11/03/2016   SpO2 100%   BMI 27.34 kg/m    Plan:  1. Hot flashes, menopausal Will start a trial of venlafaxine 37.5 mg daily for hot flashes. Discussed menopausal symptoms at length.  Menopause is the time that marks the end of your menstrual cycles. It's diagnosed after you've gone 12 months without a menstrual period.  Menopause can happen in your 40s or 19s, but the average age is 74 in  the [place]. Menopause is a natural biological process. But the physical symptoms, such as hot flashes, and emotional symptoms of menopause may disrupt your sleep, lower your energy or affect emotional health.  There are many effective treatments available, from lifestyle adjustments to hormone therapy.  - venlafaxine XR (EFFEXOR XR) 37.5 MG 24 hr capsule; Take 1 capsule (37.5 mg total) daily with breakfast by mouth.  Dispense: 30 capsule; Refill: 5  2. Tobacco dependence Smoking cessation instruction/counseling given:  counseled patient on the dangers of tobacco  use, advised patient to stop smoking, and reviewed strategies to maximize success    RTC: 3 months for menopause   Vanessa Pounds  MSN, FNP-C [facility] [address] [place], [place] [address] [phone]

## 2016-11-17 NOTE — Patient Instructions (Signed)
Will start a trial of Effexor 37.5 mg daily with breakfast.   Cool hot flashes. Dress in layers, have a cold glass of water or go somewhere cooler. Try to pinpoint what triggers your hot flashes. For many women, triggers may include hot beverages, caffeine, spicy foods, alcohol, stress, hot weather and even a warm room.  Decrease vaginal discomfort. Use over-the-counter, water-based vaginal lubricants (Astroglide, K-Y jelly, others), silicone-based lubricants or moisturizers (Replens, others). Choose products that don't contain glycerin, which can cause burning or irritation in women who are sensitive to that chemical. Staying sexually active also helps by increasing blood flow to the vagina.  Get enough sleep. Avoid caffeine, which can make it hard to get to sleep, and avoid drinking too much alcohol, which can interrupt sleep. Exercise during the day, although not right before bedtime. If hot flashes disturb your sleep, you may need to find a way to manage them before you can get adequate rest.  Practice relaxation techniques. Techniques such as deep breathing, paced breathing, guided imagery, massage and progressive muscle relaxation may help with menopausal symptoms. You can find a number of books, CDs and online offerings on different relaxation exercises.  Strengthen your pelvic floor. Pelvic floor muscle exercises, called Kegel exercises, can improve some forms of urinary incontinence.  Eat a balanced diet. Include a variety of fruits, vegetables and whole grains. Limit saturated fats, oils and sugars. Ask your provider if you need calcium or vitamin D supplements to help meet daily requirements.  Don't smoke. Smoking increases your risk of heart disease, stroke, osteoporosis, cancer and a range of other health problems. It may also increase hot flashes and bring on earlier menopause.  Exercise regularly. Get regular physical activity or exercise on most days to help protect against heart disease,  diabetes, osteoporosis and other conditions associated with aging.

## 2017-05-18 ENCOUNTER — Ambulatory Visit: Payer: Medicaid Other | Admitting: Family Medicine

## 2017-06-10 ENCOUNTER — Encounter: Payer: Self-pay | Admitting: Family Medicine

## 2017-06-10 ENCOUNTER — Ambulatory Visit (INDEPENDENT_AMBULATORY_CARE_PROVIDER_SITE_OTHER): Payer: Self-pay | Admitting: Family Medicine

## 2017-06-10 VITALS — BP 116/64 | HR 81 | Temp 99.0°F | Resp 14 | Ht 60.0 in | Wt 133.0 lb

## 2017-06-10 DIAGNOSIS — F17200 Nicotine dependence, unspecified, uncomplicated: Secondary | ICD-10-CM

## 2017-06-10 NOTE — Progress Notes (Signed)
Appointment rescheduled.    Leean Amezcua Moore Phillip Maffei  MSN, FNP-C [facility] [address]  [place], [place] [address] [phone]  

## 2017-10-20 ENCOUNTER — Telehealth: Payer: Self-pay

## 2017-10-20 NOTE — Telephone Encounter (Signed)
Called to verify appointment for 10/21/2017. No answer and no voicemail. Thanks!

## 2017-10-21 ENCOUNTER — Ambulatory Visit: Payer: Medicaid Other | Admitting: Family Medicine

## 2017-11-19 ENCOUNTER — Emergency Department (HOSPITAL_COMMUNITY): Payer: Medicaid Other

## 2017-11-19 ENCOUNTER — Encounter (HOSPITAL_COMMUNITY): Payer: Self-pay | Admitting: Emergency Medicine

## 2017-11-19 ENCOUNTER — Emergency Department (HOSPITAL_COMMUNITY)
Admission: EM | Admit: 2017-11-19 | Discharge: 2017-11-19 | Disposition: A | Discharge status: Home / Self Care | Payer: Medicaid Other | Attending: Emergency Medicine | Admitting: Emergency Medicine

## 2017-11-19 ENCOUNTER — Other Ambulatory Visit: Payer: Self-pay

## 2017-11-19 ENCOUNTER — Telehealth: Payer: Self-pay

## 2017-11-19 DIAGNOSIS — N926 Irregular menstruation, unspecified: Secondary | ICD-10-CM

## 2017-11-19 DIAGNOSIS — N946 Dysmenorrhea, unspecified: Secondary | ICD-10-CM

## 2017-11-19 DIAGNOSIS — F17210 Nicotine dependence, cigarettes, uncomplicated: Secondary | ICD-10-CM | POA: Insufficient documentation

## 2017-11-19 DIAGNOSIS — Z79899 Other long term (current) drug therapy: Secondary | ICD-10-CM | POA: Insufficient documentation

## 2017-11-19 DIAGNOSIS — M79604 Pain in right leg: Secondary | ICD-10-CM

## 2017-11-19 DIAGNOSIS — N858 Other specified noninflammatory disorders of uterus: Secondary | ICD-10-CM

## 2017-11-19 LAB — CBC WITH DIFFERENTIAL/PLATELET
ABS IMMATURE GRANULOCYTES: 0.01 10*3/uL (ref 0.00–0.07)
Basophils Absolute: 0 10*3/uL (ref 0.0–0.1)
Basophils Relative: 0 %
EOS PCT: 0 %
Eosinophils Absolute: 0 10*3/uL (ref 0.0–0.5)
HCT: 35 % — ABNORMAL LOW (ref 36.0–46.0)
HEMOGLOBIN: 10.6 g/dL — AB (ref 12.0–15.0)
IMMATURE GRANULOCYTES: 0 %
LYMPHS PCT: 31 %
Lymphs Abs: 1.7 10*3/uL (ref 0.7–4.0)
MCH: 29.2 pg (ref 26.0–34.0)
MCHC: 30.3 g/dL (ref 30.0–36.0)
MCV: 96.4 fL (ref 80.0–100.0)
Monocytes Absolute: 0.3 10*3/uL (ref 0.1–1.0)
Monocytes Relative: 6 %
NEUTROS ABS: 3.4 10*3/uL (ref 1.7–7.7)
NEUTROS PCT: 63 %
NRBC: 0 % (ref 0.0–0.2)
Platelets: 356 10*3/uL (ref 150–400)
RBC: 3.63 MIL/uL — ABNORMAL LOW (ref 3.87–5.11)
RDW: 13.9 % (ref 11.5–15.5)
WBC: 5.4 10*3/uL (ref 4.0–10.5)

## 2017-11-19 LAB — WET PREP, GENITAL
SPERM: NONE SEEN
TRICH WET PREP: NONE SEEN
Yeast Wet Prep HPF POC: NONE SEEN

## 2017-11-19 LAB — BASIC METABOLIC PANEL
ANION GAP: 7 (ref 5–15)
BUN: 5 mg/dL — ABNORMAL LOW (ref 6–20)
CHLORIDE: 108 mmol/L (ref 98–111)
CO2: 22 mmol/L (ref 22–32)
Calcium: 8.9 mg/dL (ref 8.9–10.3)
Creatinine, Ser: 0.66 mg/dL (ref 0.44–1.00)
GFR calc Af Amer: >60 mL/min (ref >60)
GFR calc non Af Amer: >60 mL/min (ref >60)
Glucose, Bld: 85 mg/dL (ref 70–99)
POTASSIUM: 4.1 mmol/L (ref 3.5–5.1)
Sodium: 137 mmol/L (ref 135–145)

## 2017-11-19 LAB — SAMPLE TO BLOOD BANK

## 2017-11-19 LAB — I-STAT BETA HCG BLOOD, ED (MC, WL, AP ONLY)

## 2017-11-19 MED ORDER — HYDROCODONE-ACETAMINOPHEN 5-325 MG PO TABS: 1.0000 | ORAL_TABLET | ORAL | 0 refills | Status: DC | PRN

## 2017-11-19 NOTE — ED Triage Notes (Signed)
Pt has had her mestration 3 times since October. Pt is now complaining of a burning sensation down her right leg. Pt has been seeing an OBGYN and her last checkup was last October.

## 2017-11-19 NOTE — ED Notes (Signed)
Patient transported to Ultrasound 

## 2017-11-19 NOTE — Discharge Instructions (Signed)
The testing today does not show any serious problems with the uterus, ovaries or your leg.  You have a very small cyst in the uterus, called a fibroid.  This tends to be present for long periods of time and not cause much problem.  Because you are having irregular menstrual cycles it will be helpful to follow-up with a gynecologist, you have been referred to one.  Use a heat on the sore area 3 or 4 times a day.

## 2017-11-19 NOTE — Telephone Encounter (Signed)
Called, no answer and voicemail did not pick up. Will try later.

## 2017-11-19 NOTE — ED Notes (Signed)
Patient verbalizes understanding of discharge instructions. Opportunity for questioning and answers were provided. Armband removed by staff, pt discharged from ED ambulatory to home.  

## 2017-11-19 NOTE — ED Provider Notes (Signed)
[facility] EMERGENCY DEPARTMENT Provider Note   CSN: [id number] Arrival date & time: 11/19/17  1214     History   Chief Complaint Chief Complaint  Patient presents with  . Leg Pain    HPI Vanessa Butler is a 42 y.o. female.  HPI   Presents for evaluation of frequent menses over the last 30 days.  She has had 3 different periods lasting several days each.  After that she had heavy periods for the last couple of years, usually about every 30 days.  She denies headache, fever, chills, nausea, vomiting, weakness or dizziness.  Today she has pain in the right lower quadrant that radiates to her right anterior thigh.  That pain is worse with movement.  There has been no trauma.  She has not seen her gynecologist recently.  She is previously had cervical bleeding, but never been diagnosed with ovarian cysts, or uterine fibroids.  There are no other known modifying factors  Past Medical History:  Diagnosis Date  . Skin disorder     Patient Active Problem List   Diagnosis Date Noted  . Tobacco dependence 02/27/2016  . Viral warts 02/27/2016  . Neuropathy 02/27/2016  . Chronic hand pain, left 02/27/2016    Past Surgical History:  Procedure Laterality Date  . FOOT SURGERY    . HAND SURGERY Left   . TUBAL LIGATION       OB History   None      Home Medications    Prior to Admission medications   Medication Sig Start Date End Date Taking? Authorizing Provider  acetaminophen (TYLENOL) 325 MG tablet Take 650 mg by mouth every 6 (six) hours as needed.    [provider]  acitretin (SORIATANE) 25 MG capsule Take 25 mg by mouth. 08/26/16   [provider]  HYDROcodone-acetaminophen (NORCO) 5-325 MG tablet Take 1 tablet by mouth every 4 (four) hours as needed for moderate pain. 11/19/17   Daleen Bo, MD  venlafaxine XR (EFFEXOR XR) 37.5 MG 24 hr capsule Take 1 capsule (37.5 mg total) daily with breakfast by mouth. Patient not taking:  Reported on 06/10/2017 11/17/16   [name], FNP    Family History Family History  Problem Relation Age of Onset  . Diabetes Maternal Grandmother   . Cancer Maternal Grandmother   . Asthma Paternal Grandmother     Social History Social History   Tobacco Use  . Smoking status: Current Some Day Smoker    Packs/day: 1.00    Types: Cigarettes  . Smokeless tobacco: Never Used  Substance Use Topics  . Alcohol use: Yes    Comment: occ  . Drug use: No     Allergies   Patient has no known allergies.   Review of Systems Review of Systems  All other systems reviewed and are negative.    Physical Exam Updated Vital Signs Ht 5' (1.524 m)   Wt 56.7 kg   BMI 24.41 kg/m   Physical Exam  Constitutional: She is oriented to person, place, and time. She appears well-developed and well-nourished. No distress.  HENT:  Head: Normocephalic and atraumatic.  Eyes: Pupils are equal, round, and reactive to light. Conjunctivae and EOM are normal.  Neck: Normal range of motion and phonation normal. Neck supple.  Cardiovascular: Normal rate and regular rhythm.  Pulmonary/Chest: Effort normal and breath sounds normal. She exhibits no tenderness.  Abdominal: Soft. She exhibits no distension. There is tenderness (Right lower quadrant, mild). There is  no guarding.  Genitourinary:  Genitourinary Comments: Normal external female genitalia.  Moderate amount of acute blood appearing in vaginal vault.  Cervix appears normal.  There is blood per the cervical os.  On bimanual examination there is midline mass, beneath the cervix, which likely emanates from the uterus.  This is possibly a retroflexed uterus versus a uterine mass.  There is no adnexal tenderness or mass.  Unable to palpate ovaries.  Musculoskeletal: Normal range of motion.  Neurological: She is alert and oriented to person, place, and time. She exhibits normal muscle tone.  Skin: Skin is warm and dry.  Psychiatric: She has a  normal mood and affect. Her behavior is normal. Judgment and thought content normal.  Nursing note and vitals reviewed.    ED Treatments / Results  Labs (all labs ordered are listed, but only abnormal results are displayed) Labs Reviewed  WET PREP, GENITAL - Abnormal; Notable for the following components:      Result Value   Clue Cells Wet Prep HPF POC PRESENT (*)    WBC, Wet Prep HPF POC RARE (*)    All other components within normal limits  CBC WITH DIFFERENTIAL/PLATELET - Abnormal; Notable for the following components:   RBC 3.63 (*)    Hemoglobin 10.6 (*)    HCT 35.0 (*)    All other components within normal limits  BASIC METABOLIC PANEL - Abnormal; Notable for the following components:   BUN 5 (*)    All other components within normal limits  RPR  HIV ANTIBODY (ROUTINE TESTING W REFLEX)  I-STAT BETA HCG BLOOD, ED ([facility], [facility], [facility] ONLY)  SAMPLE TO BLOOD BANK  GC/CHLAMYDIA PROBE AMP ([place]) NOT AT [facility]    EKG None  Radiology US Pelvic Complete With Transvaginal  Result Date: 11/19/2017 CLINICAL DATA:  Right pelvic pain radiating to the right leg. The patient has had 3 periods since October, 2019. EXAM: TRANSABDOMINAL AND TRANSVAGINAL ULTRASOUND OF PELVIS TECHNIQUE: Both transabdominal and transvaginal ultrasound examinations of the pelvis were performed. Transabdominal technique was performed for global imaging of the pelvis including uterus, ovaries, adnexal regions, and pelvic cul-de-sac. It was necessary to proceed with endovaginal exam following the transabdominal exam to visualize the adnexa. COMPARISON:  None FINDINGS: Uterus Measurements: 7.9 x 4.7 x 3.9 cm = volume: 78 mL. Hypoechoic lesion in the uterus measuring 1.7 x 1.2 x 1.5 cm is most consistent with a fibroid. Endometrium Thickness: 0.1 cm.  No focal abnormality visualized. Right ovary Measurements: 1.9 x 2.5 x 1.4 cm = volume: 3.6 mL. Normal appearance/no adnexal mass. Left ovary Measurements: 2.9 x 2.0 x 1.4 cm  = volume: 4.6 mL. Normal appearance/no adnexal mass. Other findings No abnormal free fluid. IMPRESSION: No acute abnormality or finding to explain the patient's symptoms. 1.7 cm uterine fibroid is noted. Electronically Signed   By: Inge Rise M.D.   On: 11/19/2017 16:05    Procedures Procedures (including critical care time)  Medications Ordered in ED Medications - No data to display   Initial Impression / Assessment and Plan / ED Course  I have reviewed the triage vital signs and the nursing notes.  Pertinent labs & imaging results that were available during my care of the patient were reviewed by me and considered in my medical decision making (see chart for details).  Clinical Course as of Nov 19 1705  Thu Nov 19, 2017  1659 Normal  I-Stat beta hCG blood, ED [EW]  1700 Normal except glucose cells present  Wet prep, genital(!) [EW]  1700 Normal  Basic metabolic panel(!) [EW]  1517 Normal except hemoglobin low  CBC with Differential(!) [EW]  1700 No acute intrapelvic abnormalities.  Small uterine fibroid less than 2 cm present.  US PELVIC COMPLETE WITH TRANSVAGINAL [EW]    Clinical Course User Index [EW] Daleen Bo, MD     Patient Vitals for the past 24 hrs:  Height Weight  11/19/17 1223 5' (1.524 m) 56.7 kg    5:07 PM Reevaluation with update and discussion. After initial assessment and treatment, an updated evaluation reveals she is comfortable, sitting in chair and eating crackers.  Findings discussed with the patient, all questions were answered. Daleen Bo   Medical Decision Making: Painful and irregular menses, without significant abnormal findings.  More uterine fibroid not likely contributing to the discomfort.  She is stable for discharge.  No indication for further evaluation here at this time.  CRITICAL CARE-no Performed by: Daleen Bo  Nursing Notes Reviewed/ Care Coordinated Applicable Imaging Reviewed Interpretation of Laboratory Data  incorporated into ED treatment  The patient appears reasonably screened and/or stabilized for discharge and I doubt any other medical condition or other [facility] requiring further screening, evaluation, or treatment in the ED at this time prior to discharge.  Plan: Home Medications-continue usual medications; Home Treatments-to affected area; return here if the recommended treatment, does not improve the symptoms; Recommended follow up-GYN follow-up regarding painful menses, and frequent entities.  PCP, PRN    Final Clinical Impressions(s) / ED Diagnoses   Final diagnoses:  Dysmenorrhea  Leg pain, right    ED Discharge Orders         Ordered    HYDROcodone-acetaminophen (NORCO) 5-325 MG tablet  Every 4 hours PRN     11/19/17 1705           Daleen Bo, MD 11/19/17 1709

## 2017-11-20 LAB — RPR: RPR Ser Ql: NONREACTIVE

## 2017-11-20 LAB — GC/CHLAMYDIA PROBE AMP (~~LOC~~) NOT AT ARMC
Chlamydia: NEGATIVE
NEISSERIA GONORRHEA: NEGATIVE

## 2017-11-20 LAB — HIV ANTIBODY (ROUTINE TESTING W REFLEX): HIV SCREEN 4TH GENERATION: NONREACTIVE

## 2017-12-07 ENCOUNTER — Ambulatory Visit (INDEPENDENT_AMBULATORY_CARE_PROVIDER_SITE_OTHER): Payer: Self-pay | Admitting: Family Medicine

## 2017-12-07 ENCOUNTER — Encounter: Payer: Self-pay | Admitting: Family Medicine

## 2017-12-07 VITALS — BP 127/66 | HR 88 | Temp 99.5°F | Resp 16 | Ht 60.0 in | Wt 132.0 lb

## 2017-12-07 DIAGNOSIS — D509 Iron deficiency anemia, unspecified: Secondary | ICD-10-CM

## 2017-12-07 DIAGNOSIS — J0100 Acute maxillary sinusitis, unspecified: Secondary | ICD-10-CM

## 2017-12-07 DIAGNOSIS — R400 Somnolence: Secondary | ICD-10-CM

## 2017-12-07 DIAGNOSIS — Z634 Disappearance and death of family member: Secondary | ICD-10-CM

## 2017-12-07 DIAGNOSIS — R0683 Snoring: Secondary | ICD-10-CM

## 2017-12-07 MED ORDER — FERROUS GLUCONATE 324 (38 FE) MG PO TABS: 324.0000 mg | ORAL_TABLET | Freq: Two times a day (BID) | ORAL | 2 refills | Status: DC

## 2017-12-07 MED ORDER — PREDNISONE 20 MG PO TABS: 40.0000 mg | ORAL_TABLET | Freq: Every day | ORAL | 0 refills | Status: AC

## 2017-12-07 MED ORDER — AMOXICILLIN 500 MG PO CAPS: 500.0000 mg | ORAL_CAPSULE | Freq: Two times a day (BID) | ORAL | 0 refills | Status: AC

## 2017-12-07 NOTE — Progress Notes (Signed)
Established Patient Office Visit  Subjective:  Patient ID: Vanessa Butler, female    DOB: Aug 27, 1975  Age: 42 y.o. MRN: [id number]  CC:  Chief Complaint  Patient presents with  . Follow-up    er follow up for cyst on ovary   . Headache    patient states Head feels "tight" at night when she sleeps     HPI Vanessa Butler presents for ED follow up. Patient was seen for abnormal bleeding and pain. States that she was diagnosed with cysts and has a follow up appt with Gyn on 12/22/2017. Not presently bleeding but has mild pain that persists in the lower right abdomen. Has not picked up pain medications.  Patient states that she has had headaches intermittently for the past month. States that it resolves on it own. States that her head feels pressured and then "it slows down".  Patient reports tragically losing her son and nephew in September. Her daughter-in-law and Eldridge Abrahams were also shot, but are doing well. She also reports losing her father and step-father this year. She is not going to counseling. She reports having difficulty with sleeping. She has been noted to snore and stop breathing while asleep. She states that she has some tiredness throughout the day.   Past Medical History:  Diagnosis Date  . Skin disorder     Past Surgical History:  Procedure Laterality Date  . FOOT SURGERY    . HAND SURGERY Left   . TUBAL LIGATION      Family History  Problem Relation Age of Onset  . Diabetes Maternal Grandmother   . Cancer Maternal Grandmother   . Asthma Paternal Grandmother     Social History   Socioeconomic History  . Marital status: Single    Spouse name: Not on file  . Number of children: Not on file  . Years of education: Not on file  . Highest education level: Not on file  Occupational History  . Not on file  Social Needs  . Financial resource strain: Not on file  . Food insecurity:    Worry: Not on file    Inability: Not on file  . Transportation needs:     Medical: Not on file    Non-medical: Not on file  Tobacco Use  . Smoking status: Current Some Day Smoker    Packs/day: 1.00    Types: Cigarettes  . Smokeless tobacco: Never Used  Substance and Sexual Activity  . Alcohol use: Yes    Comment: occ  . Drug use: No  . Sexual activity: Not on file  Lifestyle  . Physical activity:    Days per week: Not on file    Minutes per session: Not on file  . Stress: Not on file  Relationships  . Social connections:    Talks on phone: Not on file    Gets together: Not on file    Attends religious service: Not on file    Active member of club or organization: Not on file    Attends meetings of clubs or organizations: Not on file    Relationship status: Not on file  . Intimate partner violence:    Fear of current or ex partner: Not on file    Emotionally abused: Not on file    Physically abused: Not on file    Forced sexual activity: Not on file  Other Topics Concern  . Not on file  Social History Narrative  . Not on file  Outpatient Medications Prior to Visit  Medication Sig Dispense Refill  . acetaminophen (TYLENOL) 325 MG tablet Take 650 mg by mouth every 6 (six) hours as needed.    Marland Kitchen HYDROcodone-acetaminophen (NORCO) 5-325 MG tablet Take 1 tablet by mouth every 4 (four) hours as needed for moderate pain. 20 tablet 0  . acitretin (SORIATANE) 25 MG capsule Take 25 mg by mouth.    . venlafaxine XR (EFFEXOR XR) 37.5 MG 24 hr capsule Take 1 capsule (37.5 mg total) daily with breakfast by mouth. (Patient not taking: Reported on 06/10/2017) 30 capsule 5   No facility-administered medications prior to visit.     No Known Allergies  ROS Review of Systems  Constitutional: Negative.   HENT: Positive for ear pain (intermittent with right greater than left ), sinus pressure and sinus pain.   Eyes: Negative.   Respiratory: Negative.   Cardiovascular: Negative.   Gastrointestinal: Negative.   Endocrine: Negative.   Genitourinary: Positive  for pelvic pain (right lower).  Musculoskeletal: Negative.   Skin: Negative.   Allergic/Immunologic: Negative.   Neurological: Negative.   Hematological: Negative.   Psychiatric/Behavioral: Positive for sleep disturbance.      Objective:    Physical Exam  Constitutional: She is oriented to person, place, and time. She appears well-developed and well-nourished. No distress.  HENT:  Head: Normocephalic and atraumatic.  Right Ear: Hearing normal. A middle ear effusion is present.  Left Ear: Hearing normal. A middle ear effusion is present.  Nose: Mucosal edema and rhinorrhea present. Right sinus exhibits frontal sinus tenderness. Left sinus exhibits frontal sinus tenderness.  Mouth/Throat: Uvula is midline, oropharynx is clear and moist and mucous membranes are normal.  Eyes: Pupils are equal, round, and reactive to light. Conjunctivae and EOM are normal.  Neck: Normal range of motion.  Cardiovascular: Normal rate, regular rhythm and normal heart sounds.  Pulmonary/Chest: Effort normal and breath sounds normal. No respiratory distress.  Abdominal: Soft. Bowel sounds are normal. She exhibits no distension. There is tenderness (right lower quadrant with deep palpation. ).  Genitourinary:  Genitourinary Comments: Deferred to GYN.    Musculoskeletal: Normal range of motion.  Neurological: She is alert and oriented to person, place, and time.  Skin: Skin is warm and dry.  Psychiatric: Her speech is normal and behavior is normal. Judgment and thought content normal. Cognition and memory are normal. She exhibits a depressed mood.  Nursing note and vitals reviewed.   BP 127/66 (BP Location: Right Arm, Patient Position: Sitting, Cuff Size: Normal)   Pulse 88   Temp 99.5 F (37.5 C) (Oral)   Resp 16   Ht 5' (1.524 m)   Wt 132 lb (59.9 kg)   LMP 11/30/2017   SpO2 100%   BMI 25.78 kg/m  Wt Readings from Last 3 Encounters:  12/07/17 132 lb (59.9 kg)  11/19/17 125 lb (56.7 kg)   06/10/17 133 lb (60.3 kg)     There are no preventive care reminders to display for this patient.  There are no preventive care reminders to display for this patient.  Lab Results  Component Value Date   TSH 0.99 05/23/2016   Lab Results  Component Value Date   WBC 5.4 11/19/2017   HGB 10.6 (L) 11/19/2017   HCT 35.0 (L) 11/19/2017   MCV 96.4 11/19/2017   PLT 356 11/19/2017   Lab Results  Component Value Date   NA 137 11/19/2017   K 4.1 11/19/2017   CO2 22 11/19/2017   GLUCOSE 85  11/19/2017   BUN 5 (L) 11/19/2017   CREATININE 0.66 11/19/2017   BILITOT 0.4 09/04/2016   ALKPHOS 69 09/04/2016   AST 13 09/04/2016   ALT 11 09/04/2016   PROT 7.0 09/04/2016   ALBUMIN 4.1 09/04/2016   CALCIUM 8.9 11/19/2017   ANIONGAP 7 11/19/2017   Lab Results  Component Value Date   CHOL 134 09/04/2016   Lab Results  Component Value Date   HDL 35 (L) 09/04/2016   Lab Results  Component Value Date   LDLCALC 69 09/04/2016   Lab Results  Component Value Date   TRIG 149 09/04/2016   Lab Results  Component Value Date   CHOLHDL 3.8 09/04/2016   Lab Results  Component Value Date   HGBA1C 5.6 09/04/2016      Assessment & Plan:   Problem List Items Addressed This Visit    None    Visit Diagnoses    Acute maxillary sinusitis, recurrence not specified    -  Primary   Relevant Medications   amoxicillin (AMOXIL) 500 MG capsule   predniSONE (DELTASONE) 20 MG tablet   Snoring       Relevant Orders   Split night study   Daytime sleepiness       Relevant Orders   Split night study   Iron deficiency anemia, unspecified iron deficiency anemia type       Relevant Medications   ferrous gluconate (FERGON) 324 MG tablet      Meds ordered this encounter  Medications  . ferrous gluconate (FERGON) 324 MG tablet    Sig: Take 1 tablet (324 mg total) by mouth 2 (two) times daily with a meal.    Dispense:  60 tablet    Refill:  2  . amoxicillin (AMOXIL) 500 MG capsule    Sig:  Take 1 capsule (500 mg total) by mouth 2 (two) times daily for 7 days.    Dispense:  14 capsule    Refill:  0  . predniSONE (DELTASONE) 20 MG tablet    Sig: Take 2 tablets (40 mg total) by mouth daily with breakfast for 5 days.    Dispense:  10 tablet    Refill:  0   We discussed the need for grief counseling and medication options to aid in sleep and depression. Patient declined today and will call back if she needs anything related to this.  Patient advised to follow up with GYN for cysts and abnormal bleeding.   Follow-up: Return in about 6 months (around 06/07/2018), or if symptoms worsen or fail to improve.    Lanae Boast, FNP

## 2017-12-07 NOTE — Patient Instructions (Signed)

## 2017-12-22 ENCOUNTER — Encounter: Payer: Self-pay | Admitting: Obstetrics and Gynecology

## 2017-12-22 ENCOUNTER — Other Ambulatory Visit (HOSPITAL_COMMUNITY)
Admission: RE | Admit: 2017-12-22 | Discharge: 2017-12-22 | Disposition: A | Discharge status: Home / Self Care | Payer: Medicaid Other | Source: Ambulatory Visit | Attending: Obstetrics and Gynecology | Admitting: Obstetrics and Gynecology

## 2017-12-22 ENCOUNTER — Ambulatory Visit: Payer: Self-pay | Admitting: Obstetrics and Gynecology

## 2017-12-22 ENCOUNTER — Telehealth: Payer: Self-pay | Admitting: *Deleted

## 2017-12-22 VITALS — BP 118/71 | HR 94 | Ht 60.0 in | Wt 136.0 lb

## 2017-12-22 DIAGNOSIS — R87610 Atypical squamous cells of undetermined significance on cytologic smear of cervix (ASC-US): Secondary | ICD-10-CM | POA: Insufficient documentation

## 2017-12-22 DIAGNOSIS — Z113 Encounter for screening for infections with a predominantly sexual mode of transmission: Secondary | ICD-10-CM

## 2017-12-22 DIAGNOSIS — N939 Abnormal uterine and vaginal bleeding, unspecified: Secondary | ICD-10-CM

## 2017-12-22 DIAGNOSIS — Z124 Encounter for screening for malignant neoplasm of cervix: Secondary | ICD-10-CM

## 2017-12-22 DIAGNOSIS — Z1151 Encounter for screening for human papillomavirus (HPV): Secondary | ICD-10-CM | POA: Insufficient documentation

## 2017-12-22 DIAGNOSIS — F320 Major depressive disorder, single episode, mild: Secondary | ICD-10-CM

## 2017-12-22 NOTE — Patient Instructions (Signed)

## 2017-12-22 NOTE — Progress Notes (Signed)
GYNECOLOGY OFFICE NOTE  History:  42 y.o. [id number] here today for follow up for abnormal uterine bleeding. Usually has periods that last 5-7 days that occur monthly. Since 10/16/17, she has had four periods lasting 7 days each. H/o BTL. Not on any hormonal contraception. Has heavy periods, states she goes through 7 pads per day and bleeds through her clothes. States she cannot wear tampons because it "hurts on the inside". Has been on depo for bleeding in the past which "made her bleed for a year", has been on pills for bleeding in the past which improved her bleeding.  Due for pap smear, would like STI screen.  Son and nephew killed two months ago and she is having some depression, involved in group that aims to cease violence. Feels like she is coping well.   Past Medical History:  Diagnosis Date  . Skin disorder     Past Surgical History:  Procedure Laterality Date  . FOOT SURGERY    . HAND SURGERY Left   . TUBAL LIGATION      Current Outpatient Medications:  .  acetaminophen (TYLENOL) 325 MG tablet, Take 650 mg by mouth every 6 (six) hours as needed., Disp: , Rfl:  .  acitretin (SORIATANE) 25 MG capsule, Take 25 mg by mouth., Disp: , Rfl:  .  ferrous gluconate (FERGON) 324 MG tablet, Take 1 tablet (324 mg total) by mouth 2 (two) times daily with a meal. (Patient not taking: Reported on 12/22/2017), Disp: 60 tablet, Rfl: 2 .  HYDROcodone-acetaminophen (NORCO) 5-325 MG tablet, Take 1 tablet by mouth every 4 (four) hours as needed for moderate pain. (Patient not taking: Reported on 12/22/2017), Disp: 20 tablet, Rfl: 0 .  venlafaxine XR (EFFEXOR XR) 37.5 MG 24 hr capsule, Take 1 capsule (37.5 mg total) daily with breakfast by mouth. (Patient not taking: Reported on 06/10/2017), Disp: 30 capsule, Rfl: 5  The following portions of the patient's history were reviewed and updated as appropriate: allergies, current medications, past family history, past medical history, past social history,  past surgical history and problem list.   Review of Systems:  Pertinent items noted in HPI and remainder of comprehensive ROS otherwise negative.   Objective:  Physical Exam BP 118/71   Pulse 94   Ht 5' (1.524 m)   Wt 136 lb (61.7 kg)   LMP 12/14/2017 (Exact Date)   BMI 26.56 kg/m  CONSTITUTIONAL: Well-developed, well-nourished female in no acute distress.  HENT:  Normocephalic, atraumatic. External right and left ear normal. Oropharynx is clear and moist EYES: Conjunctivae and EOM are normal. Pupils are equal, round, and reactive to light. No scleral icterus.  NECK: Normal range of motion, supple, no masses SKIN: Skin is warm and dry. No rash noted. Not diaphoretic. No erythema. No pallor. NEUROLOGIC: Alert and oriented to person, place, and time. Normal reflexes, muscle tone coordination. No cranial nerve deficit noted. PSYCHIATRIC: Normal mood and affect. Normal behavior. Normal judgment and thought content. CARDIOVASCULAR: Normal heart rate noted RESPIRATORY: Effort normal, no problems with respiration noted ABDOMEN: Soft, no distention noted.   PELVIC: Normal appearing external genitalia; normal appearing vaginal mucosa and cervix.  No abnormal discharge noted.  Pap smear obtained.  Pelvic cultures obtained. Normal uterine size, no other palpable masses, no uterine or adnexal tenderness. MUSCULOSKELETAL: Normal range of motion. No edema noted.  Labs and Imaging No results found.  Assessment & Plan:   1. Abnormal uterine bleeding - Reviewed options for management, she reports history of headaches  with "dizziness" that occurs before they start, reviewed that although she has not been diagnosed with migraines, given the severity she describes them with, this could be aura and would not recommend OCPs. She has had depo in past which made her bleed heavily. Reviewed progestin only vs IUD, she would like to start with progestin only. Gave info for IUDs. - provera 10 mg daily sent to  pharmacy  2. Pap smear for cervical cancer screening - Cytology - PAP( [place])  3. Routine screening for STI (sexually transmitted infection) - HIV Antibody (routine testing w rflx) - RPR - Hepatitis B surface antigen - Hepatitis C Antibody  4. Current mild episode of major depressive disorder without prior episode ([place]) Significant stressors in life, declines to see behavioral health   Routine preventative health maintenance measures emphasized. Please refer to After Visit Summary for other counseling recommendations.   Return in about 3 months (around 03/23/2018) for Followup.  Total face-to-face time with patient: 30 minutes. Over 50% of encounter was spent on counseling and coordination of care.  Feliz Beam, M.D. Attending Center for [employer] [facility])

## 2017-12-22 NOTE — Telephone Encounter (Signed)
Ciclaly called and left a voice message at 4:31 pm today that she wants to see why the doctor hasn't sent in her RX yet.

## 2017-12-23 ENCOUNTER — Other Ambulatory Visit: Payer: Medicaid Other

## 2017-12-23 ENCOUNTER — Telehealth: Payer: Self-pay

## 2017-12-23 DIAGNOSIS — R768 Other specified abnormal immunological findings in serum: Secondary | ICD-10-CM

## 2017-12-23 LAB — HEPATITIS B SURFACE ANTIGEN: Hepatitis B Surface Ag: NEGATIVE

## 2017-12-23 LAB — RPR: RPR: NONREACTIVE

## 2017-12-23 LAB — HIV ANTIBODY (ROUTINE TESTING W REFLEX): HIV Screen 4th Generation wRfx: NONREACTIVE

## 2017-12-23 LAB — HEPATITIS C ANTIBODY: Hep C Virus Ab: 1.2 s/co ratio — ABNORMAL HIGH (ref 0.0–0.9)

## 2017-12-23 MED ORDER — MEDROXYPROGESTERONE ACETATE 10 MG PO TABS: 10.0000 mg | ORAL_TABLET | Freq: Every day | ORAL | 3 refills | Status: DC

## 2017-12-23 NOTE — Progress Notes (Unsigned)
.  h 

## 2017-12-23 NOTE — Telephone Encounter (Signed)
Dr. Rosana Hoes reporting sending medication that pt has requested to her pharmacy on 12/23/17.

## 2017-12-23 NOTE — Telephone Encounter (Signed)
Per Dr. Rosana Hoes pt needs a HCV nucleic acid amplification added to her labs.  Called pt and informed her that the medication she requested has been sent to her [place] and that we need to request that she come in for another lab.  Pt stated that she will be able to come in today @ 1400.  [place] office notified to put pt on schedule.

## 2017-12-25 LAB — CYTOLOGY - PAP
Chlamydia: NEGATIVE
Diagnosis: UNDETERMINED — AB
HPV (WINDOPATH): NOT DETECTED
NEISSERIA GONORRHEA: NEGATIVE
Trichomonas: NEGATIVE

## 2017-12-25 LAB — HCV RNA QUANT: Hepatitis C Quantitation: NOT DETECTED IU/mL

## 2017-12-28 ENCOUNTER — Telehealth: Payer: Self-pay | Admitting: Obstetrics and Gynecology

## 2017-12-28 DIAGNOSIS — Z113 Encounter for screening for infections with a predominantly sexual mode of transmission: Secondary | ICD-10-CM

## 2017-12-28 NOTE — Telephone Encounter (Signed)
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 She has no history for Hep C, has never been diagnosed or treated. Will have return for repeat antibody and if negative, consider first test a false positive. Reviewed all of the above with her and answered all questions, she will return for lab draw.   Feliz Beam, M.D. Attending [facility] for [employer] [facility])

## 2018-01-01 ENCOUNTER — Ambulatory Visit (INDEPENDENT_AMBULATORY_CARE_PROVIDER_SITE_OTHER): Payer: Self-pay | Admitting: Family Medicine

## 2018-01-01 ENCOUNTER — Encounter: Payer: Self-pay | Admitting: Family Medicine

## 2018-01-01 VITALS — BP 113/56 | HR 88 | Temp 98.8°F | Resp 16 | Ht 60.0 in | Wt 135.0 lb

## 2018-01-01 DIAGNOSIS — N63 Unspecified lump in unspecified breast: Secondary | ICD-10-CM

## 2018-01-01 NOTE — Progress Notes (Signed)
Acute Office Visit  Subjective:    Patient ID: Vanessa Butler, female    DOB: Aug 23, 1975, 42 y.o.   MRN: [id number]  Chief Complaint  Patient presents with  . Breast Mass    left breast     HPI Patient is in today for left breast mass that she noticed 2 days ago. The patient states that there is no pain in the breast. No exudate from the nipple.   Past Medical History:  Diagnosis Date  . Skin disorder     Past Surgical History:  Procedure Laterality Date  . FOOT SURGERY    . HAND SURGERY Left   . TUBAL LIGATION      Family History  Problem Relation Age of Onset  . Diabetes Maternal Grandmother   . Cancer Maternal Grandmother   . Asthma Paternal Grandmother     Social History   Socioeconomic History  . Marital status: Single    Spouse name: Not on file  . Number of children: Not on file  . Years of education: Not on file  . Highest education level: Not on file  Occupational History  . Not on file  Social Needs  . Financial resource strain: Not on file  . Food insecurity:    Worry: Not on file    Inability: Not on file  . Transportation needs:    Medical: Not on file    Non-medical: Not on file  Tobacco Use  . Smoking status: Current Some Day Smoker    Packs/day: 1.00    Types: Cigarettes  . Smokeless tobacco: Never Used  Substance and Sexual Activity  . Alcohol use: Yes    Comment: occ  . Drug use: No  . Sexual activity: Not on file  Lifestyle  . Physical activity:    Days per week: Not on file    Minutes per session: Not on file  . Stress: Not on file  Relationships  . Social connections:    Talks on phone: Not on file    Gets together: Not on file    Attends religious service: Not on file    Active member of club or organization: Not on file    Attends meetings of clubs or organizations: Not on file    Relationship status: Not on file  . Intimate partner violence:    Fear of current or ex partner: Not on file    Emotionally abused: Not  on file    Physically abused: Not on file    Forced sexual activity: Not on file  Other Topics Concern  . Not on file  Social History Narrative  . Not on file    Outpatient Medications Prior to Visit  Medication Sig Dispense Refill  . acetaminophen (TYLENOL) 325 MG tablet Take 650 mg by mouth every 6 (six) hours as needed.    . medroxyPROGESTERone (PROVERA) 10 MG tablet Take 1 tablet (10 mg total) by mouth daily. 30 tablet 3  . acitretin (SORIATANE) 25 MG capsule Take 25 mg by mouth.    . ferrous gluconate (FERGON) 324 MG tablet Take 1 tablet (324 mg total) by mouth 2 (two) times daily with a meal. (Patient not taking: Reported on 12/22/2017) 60 tablet 2  . HYDROcodone-acetaminophen (NORCO) 5-325 MG tablet Take 1 tablet by mouth every 4 (four) hours as needed for moderate pain. (Patient not taking: Reported on 12/22/2017) 20 tablet 0  . venlafaxine XR (EFFEXOR XR) 37.5 MG 24 hr capsule Take 1 capsule (37.5  mg total) daily with breakfast by mouth. (Patient not taking: Reported on 06/10/2017) 30 capsule 5   No facility-administered medications prior to visit.     No Known Allergies  Review of Systems  Constitutional: Negative.   HENT: Negative.   Eyes: Negative.   Respiratory: Negative.   Cardiovascular: Negative.   Gastrointestinal: Negative.   Genitourinary: Negative.   Musculoskeletal: Negative.   Skin:       Breast lump left breast.    Neurological: Negative.   Psychiatric/Behavioral: Negative.        Objective:    Physical Exam  Pulmonary/Chest:      BP (!) 113/56 (BP Location: Right Arm, Patient Position: Sitting, Cuff Size: Normal)   Pulse 88   Temp 98.8 F (37.1 C) (Oral)   Resp 16   Ht 5' (1.524 m)   Wt 135 lb (61.2 kg)   LMP 12/14/2017 (Exact Date)   SpO2 100%   BMI 26.37 kg/m  Wt Readings from Last 3 Encounters:  01/01/18 135 lb (61.2 kg)  12/22/17 136 lb (61.7 kg)  12/07/17 132 lb (59.9 kg)    There are no preventive care reminders to display  for this patient.  There are no preventive care reminders to display for this patient.   Lab Results  Component Value Date   TSH 0.99 05/23/2016   Lab Results  Component Value Date   WBC 5.4 11/19/2017   HGB 10.6 (L) 11/19/2017   HCT 35.0 (L) 11/19/2017   MCV 96.4 11/19/2017   PLT 356 11/19/2017   Lab Results  Component Value Date   NA 137 11/19/2017   K 4.1 11/19/2017   CO2 22 11/19/2017   GLUCOSE 85 11/19/2017   BUN 5 (L) 11/19/2017   CREATININE 0.66 11/19/2017   BILITOT 0.4 09/04/2016   ALKPHOS 69 09/04/2016   AST 13 09/04/2016   ALT 11 09/04/2016   PROT 7.0 09/04/2016   ALBUMIN 4.1 09/04/2016   CALCIUM 8.9 11/19/2017   ANIONGAP 7 11/19/2017   Lab Results  Component Value Date   CHOL 134 09/04/2016   Lab Results  Component Value Date   HDL 35 (L) 09/04/2016   Lab Results  Component Value Date   LDLCALC 69 09/04/2016   Lab Results  Component Value Date   TRIG 149 09/04/2016   Lab Results  Component Value Date   CHOLHDL 3.8 09/04/2016   Lab Results  Component Value Date   HGBA1C 5.6 09/04/2016       Assessment & Plan:   Problem List Items Addressed This Visit    None    Visit Diagnoses    Breast lump in female    -  Primary   Relevant Orders   MM Digital Diagnostic Unilat L       Order placed for diagnostic mammogram of the left breast for further evaluation. Return if symptoms worsen or fail to improve.    Lanae Boast, FNP

## 2018-01-01 NOTE — Patient Instructions (Signed)
Mastitis    Mastitis is irritation and swelling (inflammation) in an area of the breast. It is often caused by an infection that occurs when germs (bacteria) enter the skin. This most often happens to breastfeeding mothers, but it can happen to other women too as well as some men.  Follow these instructions at home:  Medicines  · Take over-the-counter and prescription medicines only as told by your doctor.  · If you were prescribed an antibiotic medicine, take it as told by your doctor. Do not stop taking it even if you start to feel better.  General instructions  · Do not wear a tight or underwire bra. Wear a soft support bra.  · Drink more fluids, especially if you have a fever.  · Get plenty of rest.  If you are breastfeeding:    · Keep emptying your breasts by breastfeeding or by using a breast pump.  · Keep your nipples clean and dry.  · During breastfeeding, empty the first breast before going to the other breast. Use a breast pump if your baby is not emptying your breasts.  · Massage your breasts during feeding or pumping as told by your doctor.  · If told, put moist heat on the affected area of your breast right before breastfeeding or pumping. Use the heat source that your doctor tells you to use.  · If told, put ice on the affected area of your breast right after breastfeeding or pumping:  ? Put ice in a plastic bag.  ? Place a towel between your skin and the bag.  ? Leave the ice on for 20 minutes.  · If you go back to work, pump your breasts while at work.  · Avoid letting your breasts get overly filled with milk (engorged).  Contact a doctor if:  · You have pus-like fluid leaking from your breast.  · You have a fever.  · Your symptoms do not get better within 2 days.  Get help right away if:  · Your pain and swelling are getting worse.  · Your pain is not helped by medicine.  · You have a red line going from your breast toward your armpit.  Summary  · Mastitis is irritation and swelling in an area of  the breast.  · If you were prescribed an antibiotic medicine, do not stop taking it even if you start to feel better.  · Drink more fluids and get plenty of rest.  · Contact a doctor if your symptoms do not get better within 2 days.  This information is not intended to replace advice given to you by your health care provider. Make sure you discuss any questions you have with your health care provider.  Document Released: 12/18/2008 Document Revised: 01/22/2016 Document Reviewed: 01/22/2016  Elsevier Interactive Patient Education © 2019 [employer]vier Inc[employer]

## 2018-01-11 ENCOUNTER — Other Ambulatory Visit (HOSPITAL_COMMUNITY): Payer: Self-pay | Admitting: *Deleted

## 2018-01-11 DIAGNOSIS — N6320 Unspecified lump in the left breast, unspecified quadrant: Secondary | ICD-10-CM

## 2018-01-19 ENCOUNTER — Encounter (HOSPITAL_COMMUNITY): Payer: Self-pay | Admitting: *Deleted

## 2018-01-19 ENCOUNTER — Other Ambulatory Visit (HOSPITAL_COMMUNITY): Payer: Self-pay | Admitting: Obstetrics and Gynecology

## 2018-01-19 ENCOUNTER — Ambulatory Visit
Admission: RE | Admit: 2018-01-19 | Discharge: 2018-01-19 | Disposition: A | Discharge status: Home / Self Care | Payer: Medicaid Other | Source: Ambulatory Visit | Attending: Obstetrics and Gynecology | Admitting: Obstetrics and Gynecology

## 2018-01-19 ENCOUNTER — Encounter (HOSPITAL_COMMUNITY): Payer: Self-pay

## 2018-01-19 ENCOUNTER — Ambulatory Visit
Admission: RE | Admit: 2018-01-19 | Discharge: 2018-01-19 | Disposition: A | Discharge status: Home / Self Care | Payer: No Typology Code available for payment source | Source: Ambulatory Visit | Attending: Obstetrics and Gynecology | Admitting: Obstetrics and Gynecology

## 2018-01-19 ENCOUNTER — Ambulatory Visit (HOSPITAL_COMMUNITY)
Admission: RE | Admit: 2018-01-19 | Discharge: 2018-01-19 | Disposition: A | Discharge status: Home / Self Care | Payer: Medicaid Other | Source: Ambulatory Visit | Attending: Obstetrics and Gynecology | Admitting: Obstetrics and Gynecology

## 2018-01-19 VITALS — BP 114/70 | Wt 136.0 lb

## 2018-01-19 DIAGNOSIS — N6320 Unspecified lump in the left breast, unspecified quadrant: Secondary | ICD-10-CM

## 2018-01-19 DIAGNOSIS — Z1239 Encounter for other screening for malignant neoplasm of breast: Secondary | ICD-10-CM

## 2018-01-19 DIAGNOSIS — N6325 Unspecified lump in the left breast, overlapping quadrants: Secondary | ICD-10-CM

## 2018-01-19 DIAGNOSIS — N6310 Unspecified lump in the right breast, unspecified quadrant: Secondary | ICD-10-CM

## 2018-01-19 NOTE — Progress Notes (Signed)
Complaints of left breast lump x 3 weeks that is painful. Patient states the patient comes and goes. Patient rates the pain at a 3 out of 10. Patient complained of a spontaneous milky discharge from the left breast.   Pap Smear: Pap smear not completed today. Last Pap smear was 12/22/2017 at the [facility] and [facility] with negative HPV. Per patient her most recent Pap smear is the only abnormal Pap smear she has had. Last four Pap smear results are in Epic.  Physical exam: Breasts Breasts symmetrical. No skin abnormalities bilateral breasts. No nipple retraction bilateral breasts. No nipple discharge bilateral breasts. Unable to express any nipple discharge from the left breast on exam. No lymphadenopathy. No lumps palpated right breast. Palpated a lump around 1 cm in size within the left breast at 12 o'clock above the nipple under the areola. Complaints of tenderness when palpated lump and within the left upper breast on exam. Referred patient to the [facility] for a diagnostic mammogram and left breast ultrasound. Appointment scheduled for Tuesday, January 19, 2018 at 0920.        Pelvic/Bimanual No Pap smear completed today since last Pap smear and HPV typing was 12/22/2017. Pap smear not indicated per BCCCP guidelines.   Smoking History: Patient is a current smoker. Discussed smoking cessation with patient. Referred to the [facility] Quitline and gave resources to the free smoking cessation classes at [facility].  Patient Navigation: Patient education provided. Access to services provided for patient through BCCCP program.   Breast and Cervical Cancer Risk Assessment: Patient has no family history of breast cancer, known genetic mutations, or radiation treatment to the chest before age 66. Patient has no history of cervical dysplasia, immunocompromised, or DES exposure in-utero.  Risk Assessment    Risk Scores      01/19/2018   Last edited by:  Armond Hang, LPN   5-year risk: 0.8 %   Lifetime risk: 9.4 %

## 2018-01-19 NOTE — Patient Instructions (Signed)
Explained breast self awareness with Vanessa Butler. Patient did not need a Pap smear today due to last Pap smear was 12/22/2017. Let patient know that her next Pap smear is due in December 2020 due to her last Pap smear was abnormal but HPV negative. Referred patient to the [facility] for a diagnostic mammogram and left breast ultrasound. Appointment scheduled for Tuesday, January 19, 2018 at 0920. Patient aware of appointment and will be there. Discussed smoking cessation with patient. Referred to the [facility] Quitline and gave resources to the free smoking cessation classes at [facility]. Vanessa Butler verbalized understanding.  [name], [name], RN 8:42 AM

## 2018-01-30 ENCOUNTER — Encounter (HOSPITAL_BASED_OUTPATIENT_CLINIC_OR_DEPARTMENT_OTHER): Payer: Medicaid Other

## 2018-04-27 ENCOUNTER — Other Ambulatory Visit: Payer: Self-pay

## 2018-04-27 ENCOUNTER — Telehealth: Payer: Self-pay

## 2018-04-27 MED ORDER — MEDROXYPROGESTERONE ACETATE 10 MG PO TABS: 10.0000 mg | ORAL_TABLET | Freq: Every day | ORAL | 3 refills | Status: DC

## 2018-04-27 NOTE — Progress Notes (Signed)
3 months refills of Provera sent to Pharmacy. Patient needs to be seen in 3 months per Dr. Jodi Mourning.

## 2018-04-27 NOTE — Telephone Encounter (Signed)
  Left VM message to call office with name of Rx to refill and Pharmacy, also to follow instructions and sign up for MyChart.

## 2018-05-09 ENCOUNTER — Ambulatory Visit (HOSPITAL_COMMUNITY)
Admission: EM | Admit: 2018-05-09 | Discharge: 2018-05-09 | Disposition: A | Discharge status: Home / Self Care | Payer: No Typology Code available for payment source | Attending: Family Medicine | Admitting: Family Medicine

## 2018-05-09 ENCOUNTER — Encounter (HOSPITAL_COMMUNITY): Payer: Self-pay

## 2018-05-09 ENCOUNTER — Other Ambulatory Visit: Payer: Self-pay

## 2018-05-09 DIAGNOSIS — R202 Paresthesia of skin: Secondary | ICD-10-CM

## 2018-05-09 DIAGNOSIS — R200 Anesthesia of skin: Secondary | ICD-10-CM

## 2018-05-09 MED ORDER — PREDNISONE 50 MG PO TABS: 50.0000 mg | ORAL_TABLET | Freq: Every day | ORAL | 0 refills | Status: DC

## 2018-05-09 NOTE — Discharge Instructions (Signed)
Start prednisone as directed. Ice compress to the shoulder/elbow. Follow up with PCP for further evaluation if symptoms does not resolve.

## 2018-05-09 NOTE — ED Notes (Signed)
Patient verbalizes understanding of discharge instructions. Opportunity for questioning and answers were provided. Patient discharged from [facility].

## 2018-05-09 NOTE — ED Provider Notes (Signed)
[facility]    CSN: [id number] Arrival date & time: 05/09/18  1155     History   Chief Complaint Chief Complaint  Patient presents with  . Arm Pain    HPI Vanessa Butler is a 43 y.o. female.   43 year old female comes in for 2-week history of left arm numbness/tingling.  Denies injury/trauma.  Has pins-and-needles sensation from left axilla down to the fingers.  She has difficulty extending her elbow fully, as pins-and-needles sensation is worse.  Denies swelling, erythema, warmth.  Denies neck pain, shoulder pain, elbow pain.  Denies history of diabetes.  Denies chest pain, shortness of breath, nausea, vomiting, diaphoresis.  Denies history of heart disease.  She has a history of neuropathy to the right upper extremity, but does not recall etiology.  Has not taken anything for the symptoms.     Past Medical History:  Diagnosis Date  . Skin disorder     Patient Active Problem List   Diagnosis Date Noted  . Tobacco dependence 02/27/2016  . Viral warts 02/27/2016  . Neuropathy 02/27/2016  . Chronic hand pain, left 02/27/2016    Past Surgical History:  Procedure Laterality Date  . FOOT SURGERY    . HAND SURGERY Left   . TUBAL LIGATION      OB History    Gravida  3   Para      Term      Preterm      AB      Living  2     SAB      TAB      Ectopic      Multiple      Live Births  3            Home Medications    Prior to Admission medications   Medication Sig Start Date End Date Taking? Authorizing Provider  acetaminophen (TYLENOL) 325 MG tablet Take 650 mg by mouth every 6 (six) hours as needed.    [provider]  acitretin (SORIATANE) 25 MG capsule Take 25 mg by mouth. 08/26/16   [provider]  ferrous gluconate (FERGON) 324 MG tablet Take 1 tablet (324 mg total) by mouth 2 (two) times daily with a meal. Patient not taking: Reported on 12/22/2017 12/07/17   Lanae Boast, FNP  HYDROcodone-acetaminophen  (NORCO) 5-325 MG tablet Take 1 tablet by mouth every 4 (four) hours as needed for moderate pain. Patient not taking: Reported on 12/22/2017 11/19/17   Daleen Bo, MD  medroxyPROGESTERone (PROVERA) 10 MG tablet Take 1 tablet (10 mg total) by mouth daily. 04/27/18   Shelly Bombard, MD  predniSONE (DELTASONE) 50 MG tablet Take 1 tablet (50 mg total) by mouth daily. 05/09/18   Tasia Catchings, Angelyne Terwilliger V, PA-C  venlafaxine XR (EFFEXOR XR) 37.5 MG 24 hr capsule Take 1 capsule (37.5 mg total) daily with breakfast by mouth. Patient not taking: Reported on 06/10/2017 11/17/16   [name], FNP    Family History Family History  Problem Relation Age of Onset  . Diabetes Maternal Grandmother   . Cancer Maternal Grandmother   . Asthma Paternal Grandmother     Social History Social History   Tobacco Use  . Smoking status: Current Some Day Smoker    Packs/day: 1.00    Types: Cigarettes  . Smokeless tobacco: Never Used  Substance Use Topics  . Alcohol use: Yes    Comment: occ  . Drug use: No     Allergies  Patient has no known allergies.   Review of Systems Review of Systems  Reason unable to perform ROS: See HPI as above.     Physical Exam Triage Vital Signs ED Triage Vitals  Enc Vitals Group     BP 05/09/18 1208 128/81     Pulse Rate 05/09/18 1208 95     Resp 05/09/18 1208 18     Temp 05/09/18 1208 98.4 F (36.9 C)     Temp src --      SpO2 05/09/18 1208 97 %     Weight 05/09/18 1206 130 lb (59 kg)     Height --      Head Circumference --      Peak Flow --      Pain Score 05/09/18 1205 10     Pain Loc --      Pain Edu? --      Excl. in [place]? --    No data found.  Updated Vital Signs BP 128/81 (BP Location: Right Arm)   Pulse 95   Temp 98.4 F (36.9 C)   Resp 18   Wt 130 lb (59 kg)   LMP 03/10/2018   SpO2 97%   BMI 25.39 kg/m   Physical Exam Constitutional:      General: She is not in acute distress.    Appearance: She is well-developed. She is not diaphoretic.   HENT:     Head: Normocephalic and atraumatic.  Eyes:     Conjunctiva/sclera: Conjunctivae normal.     Pupils: Pupils are equal, round, and reactive to light.  Neck:     Musculoskeletal: Normal range of motion and neck supple. No pain with movement, spinous process tenderness or muscular tenderness.  Musculoskeletal:     Comments: No obvious swelling, erythema, warmth to the shoulder, elbow, wrist, fingers. Palpation of the left axilla anteriorly can reproduce symptoms. Small soft, nontender, mobile lesions to the left axilla felt. No erythema, warmth.  Palpation of left medial upper arm also reproduces symptoms. Full ROM of shoulder, wrist, fingers. Decreased flexion of elbow as it can cause symptoms to exacerbate. Strength normal and equal bilaterally. Sensation intact and equal bilaterally. Radial pulse 2+, cap refill <2s  Neurological:     Mental Status: She is alert and oriented to person, place, and time.      UC Treatments / Results  Labs (all labs ordered are listed, but only abnormal results are displayed) Labs Reviewed - No data to display  EKG None  Radiology No results found.  Procedures Procedures (including critical care time)  Medications Ordered in UC Medications - No data to display  Initial Impression / Assessment and Plan / UC Course  I have reviewed the triage vital signs and the nursing notes.  Pertinent labs & imaging results that were available during my care of the patient were reviewed by me and considered in my medical decision making (see chart for details).    Prednisone as directed. Ice compress to shoulder and elbow. Patient with history of left breast mass that was found to be benign with no axilla lymphadenopathy. She has another breast ultrasound 07/2018. Will have patient follow up with PCP if symptoms does not resolve. Return precautions given. Patient expresses understanding and agrees to plan.  Final Clinical Impressions(s) / UC Diagnoses    Final diagnoses:  Numbness and tingling in left arm    ED Prescriptions    Medication Sig Dispense Auth. Provider   predniSONE (DELTASONE) 50 MG tablet Take  1 tablet (50 mg total) by mouth daily. 5 tablet Tobin Chad, [place] 05/09/18 1357

## 2018-05-09 NOTE — ED Triage Notes (Signed)
Pt states her left arm feels like pins and needles. This has been going on for 2 weeks.

## 2018-05-10 ENCOUNTER — Telehealth: Payer: Self-pay | Admitting: *Deleted

## 2018-05-10 NOTE — Telephone Encounter (Signed)
I called [place] and left message I am calling to remind her of her virtual visit 05/12/18 and to download [employer] app for the meeting- call us if questions.

## 2018-05-11 ENCOUNTER — Telehealth: Payer: Self-pay | Admitting: Student

## 2018-05-11 NOTE — Telephone Encounter (Signed)
Called the patient with upcoming appointment information. The patient verbalized understanding, no questions at this time.

## 2018-05-12 ENCOUNTER — Ambulatory Visit (INDEPENDENT_AMBULATORY_CARE_PROVIDER_SITE_OTHER): Payer: Self-pay | Admitting: Obstetrics and Gynecology

## 2018-05-12 ENCOUNTER — Other Ambulatory Visit: Payer: Self-pay

## 2018-05-12 ENCOUNTER — Encounter: Payer: Self-pay | Admitting: Obstetrics and Gynecology

## 2018-05-12 ENCOUNTER — Telehealth: Payer: Self-pay | Admitting: Obstetrics and Gynecology

## 2018-05-12 DIAGNOSIS — R768 Other specified abnormal immunological findings in serum: Secondary | ICD-10-CM

## 2018-05-12 DIAGNOSIS — N939 Abnormal uterine and vaginal bleeding, unspecified: Secondary | ICD-10-CM

## 2018-05-12 DIAGNOSIS — R7689 Other specified abnormal immunological findings in serum: Secondary | ICD-10-CM

## 2018-05-12 NOTE — Progress Notes (Addendum)
   [facility]LE[facility] [place] VISIT ENCOUNTER NOTE  I connected with@ on 05/12/18 at  8:15 AM EDT by WebEx at home and verified that I am speaking with the correct person using two identifiers.   I discussed the limitations, risks, security and privacy concerns of performing an evaluation and management service by telephone and the availability of in person appointments. I also discussed with the patient that there may be a patient responsible charge related to this service. The patient expressed understanding and agreed to proceed.   History:  Vanessa Butler is a 43 y.o. G3P0 female being evaluated today for AUB. Seen in 12/19 and started on provera 10 mg daily. She has not had any bleeding for 2 months. She does have cramping monthly like she would be a getting a period, but does not bleed.  She does report some arm pain, was seen at ED and got some prednisone and is to make appointment with PCP for follow up.   She denies any abnormal vaginal discharge, bleeding, pelvic pain or other concerns.      Past Medical History:  Diagnosis Date  . Skin disorder    Past Surgical History:  Procedure Laterality Date  . FOOT SURGERY    . HAND SURGERY Left   . TUBAL LIGATION     The following portions of the patient's history were reviewed and updated as appropriate: allergies, current medications, past family history, past medical history, past social history, past surgical history and problem list.   Health Maintenance:  ASCUS pap and negative HRHPV on 12/2017.  Birads 3 mammogram on 01/2017, had f/u ultrasound, likely benign cyst, has f/u 07/2018.   Review of Systems:  Pertinent items noted in HPI and remainder of comprehensive ROS otherwise negative.  Physical Exam:   General:  Alert, oriented and cooperative. Patient appears to be in no acute distress.  Mental Status: Normal mood and affect. Normal behavior. Normal judgment and thought content.   Respiratory: Normal respiratory  effort, no problems with respiration noted  Rest of physical exam deferred due to type of encounter  Labs and Imaging No results found for this or any previous visit (from the past 336 hour(s)). No results found.     Assessment and Plan:   1. Abnormal uterine bleeding (AUB) -doing well on provera, happy with not bleeding - will cont provera - reviewed it may not work indefinitely and if she starts having bleeding again, to let us know - return 6 months  2. 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    I discussed the assessment and treatment plan with the patient. The patient was provided an opportunity to ask questions and all were answered. The patient agreed with the plan and demonstrated an understanding of the instructions.   The patient was advised to call back or seek an in-person evaluation/go to the ED if the symptoms worsen or if the condition fails to improve as anticipated.  I provided 12 minutes of face-to-face via WebEx time during this encounter.   Sloan Leiter, MD [facility] [place]

## 2018-05-12 NOTE — Telephone Encounter (Signed)
Attempted to call patient to get her scheduled for her lab work appointment. No answer, left message with the appointment (5/7 @ 1:30pm) as well as the office's new address.

## 2018-05-14 NOTE — Telephone Encounter (Signed)
Message sent to provider 

## 2018-05-19 ENCOUNTER — Encounter: Payer: Self-pay | Admitting: Family Medicine

## 2018-05-19 ENCOUNTER — Other Ambulatory Visit: Payer: Self-pay

## 2018-05-19 ENCOUNTER — Ambulatory Visit (HOSPITAL_COMMUNITY)
Admission: RE | Admit: 2018-05-19 | Discharge: 2018-05-19 | Disposition: A | Discharge status: Home / Self Care | Payer: Medicaid Other | Source: Ambulatory Visit | Attending: Family Medicine | Admitting: Family Medicine

## 2018-05-19 ENCOUNTER — Ambulatory Visit (INDEPENDENT_AMBULATORY_CARE_PROVIDER_SITE_OTHER): Payer: Self-pay | Admitting: Family Medicine

## 2018-05-19 VITALS — BP 124/68 | HR 78 | Temp 98.1°F | Ht 60.0 in | Wt 145.0 lb

## 2018-05-19 DIAGNOSIS — Z09 Encounter for follow-up examination after completed treatment for conditions other than malignant neoplasm: Secondary | ICD-10-CM

## 2018-05-19 DIAGNOSIS — S63502A Unspecified sprain of left wrist, initial encounter: Secondary | ICD-10-CM | POA: Diagnosis not present

## 2018-05-19 DIAGNOSIS — K219 Gastro-esophageal reflux disease without esophagitis: Secondary | ICD-10-CM

## 2018-05-19 MED ORDER — NAPROXEN 500 MG PO TABS: 500.0000 mg | ORAL_TABLET | Freq: Two times a day (BID) | ORAL | 2 refills | Status: DC

## 2018-05-19 MED ORDER — OMEPRAZOLE 20 MG PO CPDR: 20.0000 mg | DELAYED_RELEASE_CAPSULE | Freq: Every day | ORAL | 2 refills | Status: DC

## 2018-05-19 NOTE — Progress Notes (Signed)
Patient [facility]   Sick Visit  Subjective:  Patient ID: Vanessa Butler, female    DOB: June 16, 1975  Age: 43 y.o. MRN: [id number]  CC:  Chief Complaint  Patient presents with  . Follow-up    Numbness and tingling in arm when she strectches it    HPI Vanessa Butler is a 43 year old female who presents for Sick Visit today.   Past Medical History:  Diagnosis Date  . Grieving   . Skin disorder    Current Status: Since her last office visit, she has c/o left wrist/arm pain X 1 week. She states that she has not injured her arm. She states that pain is dependent on the position of how she moves her arm. She has not taken any medications for pain relief. She is a current patient of Lanae Boast, NP at our office. She also has increased incidents of acid reflux lately. Her anxiety is mild today.   She denies fevers, chills, fatigue, recent infections, weight loss, and night sweats. She has not had any headaches, visual changes, dizziness, and falls. No chest pain, heart palpitations, cough and shortness of breath reported. No reports of GI problems such as nausea, vomiting, diarrhea, and constipation. She has no reports of blood in stools, dysuria and hematuria.   Past Surgical History:  Procedure Laterality Date  . FOOT SURGERY    . HAND SURGERY Left   . TUBAL LIGATION      Family History  Problem Relation Age of Onset  . Diabetes Maternal Grandmother   . Cancer Maternal Grandmother   . Asthma Paternal Grandmother     Social History   Socioeconomic History  . Marital status: Single    Spouse name: Not on file  . Number of children: 3  . Years of education: Not on file  . Highest education level: 10th grade  Occupational History  . Not on file  Social Needs  . Financial resource strain: Not on file  . Food insecurity:    Worry: Not on file    Inability: Not on file  . Transportation needs:    Medical: No   Non-medical: No  Tobacco Use  . Smoking status: Current Some Day Smoker    Packs/day: 1.00    Types: Cigarettes  . Smokeless tobacco: Never Used  Substance and Sexual Activity  . Alcohol use: Yes    Comment: occ  . Drug use: No  . Sexual activity: Yes    Birth control/protection: Surgical  Lifestyle  . Physical activity:    Days per week: Not on file    Minutes per session: Not on file  . Stress: Not on file  Relationships  . Social connections:    Talks on phone: Not on file    Gets together: Not on file    Attends religious service: Not on file    Active member of club or organization: Not on file    Attends meetings of clubs or organizations: Not on file    Relationship status: Not on file  . Intimate partner violence:    Fear of current or ex partner: Not on file    Emotionally abused: Not on file    Physically abused: Not on file    Forced sexual activity: Not on file  Other Topics Concern  . Not on file  Social History Narrative  . Not on file    Outpatient Medications Prior to Visit  Medication Sig Dispense Refill  . medroxyPROGESTERone (PROVERA) 10 MG tablet Take 1 tablet (10 mg total) by mouth daily. 30 tablet 3  . acetaminophen (TYLENOL) 325 MG tablet Take 650 mg by mouth every 6 (six) hours as needed.    Marland Kitchen acitretin (SORIATANE) 25 MG capsule Take 25 mg by mouth.    . ferrous gluconate (FERGON) 324 MG tablet Take 1 tablet (324 mg total) by mouth 2 (two) times daily with a meal. (Patient not taking: Reported on 12/22/2017) 60 tablet 2  . HYDROcodone-acetaminophen (NORCO) 5-325 MG tablet Take 1 tablet by mouth every 4 (four) hours as needed for moderate pain. (Patient not taking: Reported on 12/22/2017) 20 tablet 0  . predniSONE (DELTASONE) 50 MG tablet Take 1 tablet (50 mg total) by mouth daily. (Patient not taking: Reported on 05/19/2018) 5 tablet 0  . venlafaxine XR (EFFEXOR XR) 37.5 MG 24 hr capsule Take 1 capsule (37.5 mg total) daily with breakfast by mouth.  (Patient not taking: Reported on 06/10/2017) 30 capsule 5   No facility-administered medications prior to visit.     Allergies  Allergen Reactions  . Doxycycline Nausea Only    ROS Review of Systems  Constitutional: Negative.   HENT: Negative.   Eyes: Negative.   Respiratory: Negative.   Cardiovascular: Negative.   Gastrointestinal:       Acid Reflux  Endocrine: Negative.   Genitourinary: Negative.   Musculoskeletal: Positive for arthralgias (left wrist/arm pain).  Skin: Negative.   Allergic/Immunologic: Negative.   Neurological: Negative.   Psychiatric/Behavioral: Negative.       Objective:    Physical Exam  Constitutional: She is oriented to person, place, and time. She appears well-developed and well-nourished.  HENT:  Head: Normocephalic and atraumatic.  Eyes: Conjunctivae are normal.  Neck: Normal range of motion. Neck supple.  Cardiovascular: Normal rate, normal heart sounds and intact distal pulses.  Pulmonary/Chest: Effort normal and breath sounds normal.  Abdominal: Soft. Bowel sounds are normal.  Musculoskeletal: Normal range of motion.  Neurological: She is alert and oriented to person, place, and time. She has normal reflexes.  Skin: Skin is warm and dry.  Psychiatric: She has a normal mood and affect. Her behavior is normal. Judgment and thought content normal.  Nursing note and vitals reviewed.   BP 124/68 (BP Location: Left Arm, Patient Position: Sitting, Cuff Size: Small)   Pulse 78   Temp 98.1 F (36.7 C) (Oral)   Ht 5' (1.524 m)   Wt 145 lb (65.8 kg)   LMP 03/16/2018   SpO2 100%   BMI 28.32 kg/m  Wt Readings from Last 3 Encounters:  05/19/18 145 lb (65.8 kg)  05/09/18 130 lb (59 kg)  01/19/18 136 lb (61.7 kg)     There are no preventive care reminders to display for this patient.  There are no preventive care reminders to display for this patient.  Lab Results  Component Value Date   TSH 0.99 05/23/2016   Lab Results  Component  Value Date   WBC 5.4 11/19/2017   HGB 10.6 (L) 11/19/2017   HCT 35.0 (L) 11/19/2017   MCV 96.4 11/19/2017   PLT 356 11/19/2017   Lab Results  Component Value Date   NA 137 11/19/2017   K 4.1 11/19/2017   CO2 22 11/19/2017   GLUCOSE 85 11/19/2017   BUN 5 (L) 11/19/2017   CREATININE 0.66 11/19/2017   BILITOT 0.4 09/04/2016   ALKPHOS 69 09/04/2016   AST 13 09/04/2016   ALT  11 09/04/2016   PROT 7.0 09/04/2016   ALBUMIN 4.1 09/04/2016   CALCIUM 8.9 11/19/2017   ANIONGAP 7 11/19/2017   Lab Results  Component Value Date   CHOL 134 09/04/2016   Lab Results  Component Value Date   HDL 35 (L) 09/04/2016   Lab Results  Component Value Date   LDLCALC 69 09/04/2016   Lab Results  Component Value Date   TRIG 149 09/04/2016   Lab Results  Component Value Date   CHOLHDL 3.8 09/04/2016   Lab Results  Component Value Date   HGBA1C 5.6 09/04/2016      Assessment & Plan:   1. Left wrist sprain, initial encounter We will initiate Naproxen today. RICE educational material reviewed and printed for patient at discharge today. Patient also advised to wear wrist brace for better stabilization of wrist.  - DG Forearm Left; Future - naproxen (NAPROSYN) 500 MG tablet; Take 1 tablet (500 mg total) by mouth 2 (two) times daily with a meal.  Dispense: 30 tablet; Refill: 2  2. Gastroesophageal reflux disease without esophagitis We will initiate Prilosec today.  - H.pylori screen, POC - omeprazole (PRILOSEC) 20 MG capsule; Take 1 capsule (20 mg total) by mouth daily.  Dispense: 30 capsule; Refill: 2  3. Follow up He will follow up in 1 month.    Problem List Items Addressed This Visit    None    Visit Diagnoses    Left wrist sprain, initial encounter    -  Primary   Relevant Medications   naproxen (NAPROSYN) 500 MG tablet   Other Relevant Orders   DG Forearm Left (Completed)   Gastroesophageal reflux disease without esophagitis       Relevant Medications   omeprazole  (PRILOSEC) 20 MG capsule   Other Relevant Orders   H.pylori screen, POC   Follow up          Meds ordered this encounter  Medications  . omeprazole (PRILOSEC) 20 MG capsule    Sig: Take 1 capsule (20 mg total) by mouth daily.    Dispense:  30 capsule    Refill:  2  . naproxen (NAPROSYN) 500 MG tablet    Sig: Take 1 tablet (500 mg total) by mouth 2 (two) times daily with a meal.    Dispense:  30 tablet    Refill:  2    Follow-up: Return in about 1 month (around 06/19/2018).    Azzie Glatter, FNP

## 2018-05-19 NOTE — Patient Instructions (Signed)
Wrist Sprain, Adult A wrist sprain is a stretch or tear in the strong, fibrous tissues (ligaments) that connect your wrist bones. There are three types of wrist sprains:  Grade 1. In this type of sprain, the ligament is stretched more than normal.  Grade 2. In this type of sprain, the ligament is partially torn. You may be able to move your wrist, but not very much.  Grade 3. In this type of sprain, the ligament or muscle is completely torn. You may find it difficult or extremely painful to move your wrist even a little. What are the causes? A wrist sprain can be caused by using the wrist too much during sports, exercise, or at work. It can also happen with a fall or during an accident. What increases the risk? This condition is more likely to occur in people:  With a previous wrist or arm injury.  With poor wrist strength and flexibility.  Who play contact sports, such as football or soccer.  Who play sports that may result in a fall, such as skateboarding, biking, skiing, or snowboarding.  Who do not exercise regularly.  Who use exercise equipment that does not fit well. What are the signs or symptoms? Symptoms of this condition include:  Pain in the wrist, arm, or hand.  Swelling or bruised skin near the wrist, hand, or arm. The skin may look yellow or kind of blue.  Stiffness or trouble moving the hand.  Hearing a pop or feeling a tear at the time of the injury.  A warm feeling in the skin around the wrist. How is this diagnosed? This condition is diagnosed with a physical exam. Sometimes an X-ray is taken to make sure a bone did not break. If your health care provider thinks that you tore a ligament, he or she may order an MRI of your wrist. How is this treated? This condition is treated by resting and applying ice to your wrist. Additional treatment may include:  Medicine for pain and inflammation.  A splint to keep your wrist still (immobilized).  Exercises to  strengthen and stretch your wrist.  Surgery. This may be done if the ligament is completely torn. Follow these instructions at home: If you have a splint:   Do not put pressure on any part of the splint until it is fully hardened. This may take several hours.  Wear the splint as told by your health care provider. Remove it only as told by your health care provider.  Loosen the splint if your fingers tingle, become numb, or turn cold and blue.  If your splint is not waterproof: ? Do not let it get wet. ? Cover it with a watertight covering when you take a bath or a shower.  Keep the splint clean. Managing pain, stiffness, and swelling   If directed, put ice on the injured area. ? If you have a removable splint, remove it as told by your health care provider. ? Put ice in a plastic bag. ? Place a towel between your skin and the bag or between the splint and the bag. ? Leave the ice on for 20 minutes, 2-3 times per day.  Move your fingers often to avoid stiffness and to lessen swelling.  Raise (elevate) the injured area above the level of your heart while you are sitting or lying down. Activity  Rest your wrist. Do not do things that cause pain.  Return to your normal activities as told by your health care provider.   Ask your health care provider what activities are safe for you.  Do exercises as told by your health care provider. General instructions  Take over-the-counter and prescription medicines only as told by your health care provider.  Do not use any products that contain nicotine or tobacco, such as cigarettes and e-cigarettes. These can delay healing. If you need help quitting, ask your health care provider.  Ask your health care provider when it is safe to drive if you have a splint.  Keep all follow-up visits as told by your health care provider. This is important. Contact a health care provider if:  Your pain, bruising, or swelling gets worse.  Your skin  becomes red, gets a rash, or has open sores.  Your pain does not get better or it gets worse. Get help right away if:  You have a new or sudden sharp pain in the hand, arm, or wrist.  You have tingling or numbness in your hand.  Your fingers turn white, very red, or cold and blue.  You cannot move your fingers. This information is not intended to replace advice given to you by your health care provider. Make sure you discuss any questions you have with your health care provider. Document Released: 09/02/2013 Document Revised: 07/28/2015 Document Reviewed: 07/19/2015 Elsevier Interactive Patient Education  2019 [employer] Inc. Naproxen and naproxen sodium oral immediate-release tablets What is this medicine? NAPROXEN (na PROX en) is a non-steroidal anti-inflammatory drug (NSAID). It is used to reduce swelling and to treat pain. This medicine may be used for dental pain, headache, or painful monthly periods. It is also used for painful joint and muscular problems such as arthritis, tendinitis, bursitis, and gout. This medicine may be used for other purposes; ask your health care provider or pharmacist if you have questions. COMMON BRAND NAME(S): Aflaxen, Aleve, Aleve Arthritis, All Day Relief, Anaprox, Anaprox DS, Naprosyn, [facility] Naproxen Sodium What should I tell my health care provider before I take this medicine? They need to know if you have any of these conditions: -cigarette smoker -coronary artery bypass graft (CABG) surgery within the past 2 weeks -drink more than 3 alcohol-containing drinks a day -heart disease -high blood pressure -history of stomach bleeding -kidney disease -liver disease -lung or breathing disease, like asthma -an unusual or allergic reaction to naproxen, aspirin, other NSAIDs, other medicines, foods, dyes, or preservatives -pregnant or trying to get pregnant -breast-feeding How should I use this medicine? Take this medicine by mouth with a glass of  water. Follow the directions on the prescription label. Take it with food if your stomach gets upset. Try to not lie down for at least 10 minutes after you take it. Take your medicine at regular intervals. Do not take your medicine more often than directed. Long-term, continuous use may increase the risk of heart attack or stroke. A special MedGuide will be given to you by the pharmacist with each prescription and refill. Be sure to read this information carefully each time. Talk to your pediatrician regarding the use of this medicine in children. Special care may be needed. Overdosage: If you think you have taken too much of this medicine contact a poison control center or emergency room at once. NOTE: This medicine is only for you. Do not share this medicine with others. What if I miss a dose? If you miss a dose, take it as soon as you can. If it is almost time for your next dose, take only that dose. Do not take double  or extra doses. What may interact with this medicine? -alcohol -aspirin -cidofovir -diuretics -lithium -methotrexate -other drugs for inflammation like ketorolac or prednisone -pemetrexed -probenecid -warfarin This list may not describe all possible interactions. Give your health care provider a list of all the medicines, herbs, non-prescription drugs, or dietary supplements you use. Also tell them if you smoke, drink alcohol, or use illegal drugs. Some items may interact with your medicine. What should I watch for while using this medicine? Tell your doctor or health care professional if your pain does not get better. Talk to your doctor before taking another medicine for pain. Do not treat yourself. This medicine does not prevent heart attack or stroke. In fact, this medicine may increase the chance of a heart attack or stroke. The chance may increase with longer use of this medicine and in people who have heart disease. If you take aspirin to prevent heart attack or stroke,  talk with your doctor or health care professional. Do not take other medicines that contain aspirin, ibuprofen, or naproxen with this medicine. Side effects such as stomach upset, nausea, or ulcers may be more likely to occur. Many medicines available without a prescription should not be taken with this medicine. This medicine can cause ulcers and bleeding in the stomach and intestines at any time during treatment. Do not smoke cigarettes or drink alcohol. These increase irritation to your stomach and can make it more susceptible to damage from this medicine. Ulcers and bleeding can happen without warning symptoms and can cause death. You may get drowsy or dizzy. Do not drive, use machinery, or do anything that needs mental alertness until you know how this medicine affects you. Do not stand or sit up quickly, especially if you are an older patient. This reduces the risk of dizzy or fainting spells. This medicine can cause you to bleed more easily. Try to avoid damage to your teeth and gums when you brush or floss your teeth. What side effects may I notice from receiving this medicine? Side effects that you should report to your doctor or health care professional as soon as possible: -black or bloody stools, blood in the urine or vomit -blurred vision -chest pain -difficulty breathing or wheezing -nausea or vomiting -severe stomach pain -skin rash, skin redness, blistering or peeling skin, hives, or itching -slurred speech or weakness on one side of the body -swelling of eyelids, throat, lips -unexplained weight gain or swelling -unusually weak or tired -yellowing of eyes or skin Side effects that usually do not require medical attention (report to your doctor or health care professional if they continue or are bothersome): -constipation -headache -heartburn This list may not describe all possible side effects. Call your doctor for medical advice about side effects. You may report side effects  to FDA at 1-800-FDA-1088. Where should I keep my medicine? Keep out of the reach of children. Store at room temperature between 15 and 30 degrees C (59 and 86 degrees F). Keep container tightly closed. Throw away any unused medicine after the expiration date. NOTE: This sheet is a summary. It may not cover all possible information. If you have questions about this medicine, talk to your doctor, pharmacist, or health care provider.  2019 Elsevier/Gold Standard (2016-09-04 11:20:43) Gastroesophageal Reflux Disease, Adult Gastroesophageal reflux (GER) happens when acid from the stomach flows up into the tube that connects the mouth and the stomach (esophagus). Normally, food travels down the esophagus and stays in the stomach to be digested. With GER,  food and stomach acid sometimes move back up into the esophagus. You may have a disease called gastroesophageal reflux disease (GERD) if the reflux:  Happens often.  Causes frequent or very bad symptoms.  Causes problems such as damage to the esophagus. When this happens, the esophagus becomes sore and swollen (inflamed). Over time, GERD can make small holes (ulcers) in the lining of the esophagus. What are the causes? This condition is caused by a problem with the muscle between the esophagus and the stomach. When this muscle is weak or not normal, it does not close properly to keep food and acid from coming back up from the stomach. The muscle can be weak because of:  Tobacco use.  Pregnancy.  Having a certain type of hernia (hiatal hernia).  Alcohol use.  Certain foods and drinks, such as coffee, chocolate, onions, and peppermint. What increases the risk? You are more likely to develop this condition if you:  Are overweight.  Have a disease that affects your connective tissue.  Use NSAID medicines. What are the signs or symptoms? Symptoms of this condition include:  Heartburn.  Difficult or painful swallowing.  The feeling of  having a lump in the throat.  A bitter taste in the mouth.  Bad breath.  Having a lot of saliva.  Having an upset or bloated stomach.  Belching.  Chest pain. Different conditions can cause chest pain. Make sure you see your doctor if you have chest pain.  Shortness of breath or noisy breathing (wheezing).  Ongoing (chronic) cough or a cough at night.  Wearing away of the surface of teeth (tooth enamel).  Weight loss. How is this treated? Treatment will depend on how bad your symptoms are. Your doctor may suggest:  Changes to your diet.  Medicine.  Surgery. Follow these instructions at home: Eating and drinking   Follow a diet as told by your doctor. You may need to avoid foods and drinks such as: ? Coffee and tea (with or without caffeine). ? Drinks that contain alcohol. ? Energy drinks and sports drinks. ? Bubbly (carbonated) drinks or sodas. ? Chocolate and cocoa. ? Peppermint and mint flavorings. ? Garlic and onions. ? Horseradish. ? Spicy and acidic foods. These include peppers, chili powder, curry powder, vinegar, hot sauces, and BBQ sauce. ? Citrus fruit juices and citrus fruits, such as oranges, lemons, and limes. ? Tomato-based foods. These include red sauce, chili, salsa, and pizza with red sauce. ? Fried and fatty foods. These include donuts, french fries, potato chips, and high-fat dressings. ? High-fat meats. These include hot dogs, rib eye steak, sausage, ham, and bacon. ? High-fat dairy items, such as whole milk, butter, and cream cheese.  Eat small meals often. Avoid eating large meals.  Avoid drinking large amounts of liquid with your meals.  Avoid eating meals during the 2-3 hours before bedtime.  Avoid lying down right after you eat.  Do not exercise right after you eat. Lifestyle   Do not use any products that contain nicotine or tobacco. These include cigarettes, e-cigarettes, and chewing tobacco. If you need help quitting, ask your  doctor.  Try to lower your stress. If you need help doing this, ask your doctor.  If you are overweight, lose an amount of weight that is healthy for you. Ask your doctor about a safe weight loss goal. General instructions  Pay attention to any changes in your symptoms.  Take over-the-counter and prescription medicines only as told by your doctor. Do not  take aspirin, ibuprofen, or other NSAIDs unless your doctor says it is okay.  Wear loose clothes. Do not wear anything tight around your waist.  Raise (elevate) the head of your bed about 6 inches (15 cm).  Avoid bending over if this makes your symptoms worse.  Keep all follow-up visits as told by your doctor. This is important. Contact a doctor if:  You have new symptoms.  You lose weight and you do not know why.  You have trouble swallowing or it hurts to swallow.  You have wheezing or a cough that keeps happening.  Your symptoms do not get better with treatment.  You have a hoarse voice. Get help right away if:  You have pain in your arms, neck, jaw, teeth, or back.  You feel sweaty, dizzy, or light-headed.  You have chest pain or shortness of breath.  You throw up (vomit) and your throw-up looks like blood or coffee grounds.  You pass out (faint).  Your poop (stool) is bloody or black.  You cannot swallow, drink, or eat. Summary  If a person has gastroesophageal reflux disease (GERD), food and stomach acid move back up into the esophagus and cause symptoms or problems such as damage to the esophagus.  Treatment will depend on how bad your symptoms are.  Follow a diet as told by your doctor.  Take all medicines only as told by your doctor. This information is not intended to replace advice given to you by your health care provider. Make sure you discuss any questions you have with your health care provider. Document Released: 06/18/2007 Document Revised: 07/08/2017 Document Reviewed: 07/08/2017 Elsevier  Interactive Patient Education  2019 Elsevier Inc. Omeprazole capsules (sprinkle caps) - Rx What is this medicine? OMEPRAZOLE (oh ME pray zol) prevents the production of acid in the stomach. It is used to treat gastroesophageal reflux disease (GERD), ulcers, certain bacteria in the stomach, inflammation of the esophagus, and Zollinger-Ellison Syndrome. It is also used to treat other conditions that cause too much stomach acid. This medicine may be used for other purposes; ask your health care provider or pharmacist if you have questions. COMMON BRAND NAME(S): Prilosec What should I tell my health care provider before I take this medicine? They need to know if you have any of these conditions: -liver disease -low levels of magnesium in the blood -lupus -an unusual or allergic reaction to omeprazole, other medicines, foods, dyes, or preservatives -pregnant or trying to get pregnant -breast-feeding How should I use this medicine? Take this medicine by mouth with a glass of water. Follow the directions on the prescription label. Do not crush, break or chew the capsules. They can be opened and the contents sprinkled on a small amount of applesauce or yogurt, given with fruit juices, or swallowed immediately with water. This medicine works best if taken on an empty stomach 30 to 60 minutes before breakfast. Take your doses at regular intervals. Do not take your medicine more often than directed. Talk to your pediatrician regarding the use of this medicine in children. Special care may be needed. Overdosage: If you think you have taken too much of this medicine contact a poison control center or emergency room at once. NOTE: This medicine is only for you. Do not share this medicine with others. What if I miss a dose? If you miss a dose, take it as soon as you can. If it is almost time for your next dose, take only that dose. Do not take double  or extra doses. What may interact with this medicine? Do  not take this medicine with any of the following medications: -atazanavir -clopidogrel -nelfinavir This medicine may also interact with the following medications: -ampicillin -certain medicines for anxiety or sleep -certain medicines that treat or prevent blood clots like warfarin -cyclosporine -diazepam -digoxin -disulfiram -diuretics -iron salts -methotrexate -mycophenolate mofetil -phenytoin -prescription medicine for fungal or yeast infection like itraconazole, ketoconazole, voriconazole -saquinavir -tacrolimus This list may not describe all possible interactions. Give your health care provider a list of all the medicines, herbs, non-prescription drugs, or dietary supplements you use. Also tell them if you smoke, drink alcohol, or use illegal drugs. Some items may interact with your medicine. What should I watch for while using this medicine? It can take several days before your stomach pain gets better. Check with your doctor or health care professional if your condition does not start to get better, or if it gets worse. You may need blood work done while you are taking this medicine. This medicine may cause a decrease in vitamin B12. You should make sure that you get enough vitamin B12 while you are taking this medicine. Discuss the foods you eat and the vitamins you take with your health care professional. What side effects may I notice from receiving this medicine? Side effects that you should report to your doctor or health care professional as soon as possible: - allergic reactions like skin rash, itching or hives, swelling of the face, lips, or tongue - bone, muscle or joint pain - breathing problems - chest pain or chest tightness - dark yellow or brown urine - dizziness - fast, irregular heartbeat - feeling faint or lightheaded - fever or sore throat - muscle spasm - palpitations - rash on cheeks or arms that gets worse in the sun - redness,  blistering, peeling or loosening of the skin, including inside the mouth - seizures -stomach polyps - tremors - unusual bleeding or bruising - unusually weak or tired - yellowing of the eyes or skin Side effects that usually do not require medical attention (report to your doctor or health care professional if they continue or are bothersome): - constipation - diarrhea - dry mouth - headache - nausea This list may not describe all possible side effects. Call your doctor for medical advice about side effects. You may report side effects to FDA at 1-800-FDA-1088. Where should I keep my medicine? Keep out of the reach of children. Store at room temperature between 15 and 30 degrees C (59 and 86 degrees F). Protect from light and moisture. Throw away any unused medicine after the expiration date. NOTE: This sheet is a summary. It may not cover all possible information. If you have questions about this medicine, talk to your doctor, pharmacist, or health care provider.  2019 Elsevier/Gold Standard (2016-08-15 13:27:37)

## 2018-05-20 ENCOUNTER — Other Ambulatory Visit: Payer: Medicaid Other

## 2018-05-20 DIAGNOSIS — R768 Other specified abnormal immunological findings in serum: Secondary | ICD-10-CM

## 2018-05-20 DIAGNOSIS — K219 Gastro-esophageal reflux disease without esophagitis: Secondary | ICD-10-CM | POA: Insufficient documentation

## 2018-05-20 DIAGNOSIS — S63502A Unspecified sprain of left wrist, initial encounter: Secondary | ICD-10-CM | POA: Insufficient documentation

## 2018-05-21 ENCOUNTER — Ambulatory Visit: Payer: Medicaid Other | Admitting: Family Medicine

## 2018-05-28 LAB — HEPATITIS C ANTIBODY: Hep C Virus Ab: 0.9 s/co ratio (ref 0.0–0.9)

## 2018-05-28 LAB — HCV RNA QUANT: Hepatitis C Quantitation: NOT DETECTED IU/mL

## 2018-05-30 ENCOUNTER — Encounter: Payer: Self-pay | Admitting: Obstetrics and Gynecology

## 2018-05-30 DIAGNOSIS — R768 Other specified abnormal immunological findings in serum: Secondary | ICD-10-CM | POA: Insufficient documentation

## 2018-06-08 ENCOUNTER — Telehealth: Payer: Self-pay

## 2018-06-08 NOTE — Telephone Encounter (Signed)
Called to do COVID -19 Screening. Patient preferred to have visit via telephone. Thanks!

## 2018-06-09 ENCOUNTER — Other Ambulatory Visit: Payer: Self-pay

## 2018-06-09 ENCOUNTER — Ambulatory Visit (INDEPENDENT_AMBULATORY_CARE_PROVIDER_SITE_OTHER): Payer: Self-pay | Admitting: Family Medicine

## 2018-06-09 DIAGNOSIS — M25571 Pain in right ankle and joints of right foot: Secondary | ICD-10-CM

## 2018-06-09 DIAGNOSIS — Z09 Encounter for follow-up examination after completed treatment for conditions other than malignant neoplasm: Secondary | ICD-10-CM

## 2018-06-09 NOTE — Progress Notes (Signed)
Virtual Visit via Telephone Note  I connected with Vanessa Butler on 06/10/18 at  2:00 PM EDT by telephone and verified that I am speaking with the correct person using two identifiers.   I discussed the limitations, risks, security and privacy concerns of performing an evaluation and management service by telephone and the availability of in person appointments. I also discussed with the patient that there may be a patient responsible charge related to this service. The patient expressed understanding and agreed to proceed.   History of Present Illness:  Past Medical History:  Diagnosis Date  . Grieving   . Skin disorder     Current Outpatient Medications on File Prior to Visit  Medication Sig Dispense Refill  . acetaminophen (TYLENOL) 325 MG tablet Take 650 mg by mouth every 6 (six) hours as needed.    Marland Kitchen acitretin (SORIATANE) 25 MG capsule Take 25 mg by mouth.    . ferrous gluconate (FERGON) 324 MG tablet Take 1 tablet (324 mg total) by mouth 2 (two) times daily with a meal. 60 tablet 2  . medroxyPROGESTERone (PROVERA) 10 MG tablet Take 1 tablet (10 mg total) by mouth daily. 30 tablet 3  . naproxen (NAPROSYN) 500 MG tablet Take 1 tablet (500 mg total) by mouth 2 (two) times daily with a meal. 30 tablet 2  . omeprazole (PRILOSEC) 20 MG capsule Take 1 capsule (20 mg total) by mouth daily. 30 capsule 2   No current facility-administered medications on file prior to visit.     Current Status: Since her last office visit, she is doing well with no complaints. Her anxiety is mild today. She denies suicidal ideations, homicidal ideations, or auditory hallucinations. She states that she has been experiencing mild right ankle pain. She denies any injury.   She denies fevers, chills, fatigue, recent infections, weight loss, and night sweats. She has not had any headaches, visual changes, dizziness, and falls. No chest pain, heart palpitations, cough and shortness of breath reported. No  reports of GI problems such as nausea, vomiting, diarrhea, and constipation. She has no reports of blood in stools, dysuria and hematuria.   Observations/Objective:  Telephone Virtual Visit   Assessment and Plan:  1. Right ankle pain, unspecified chronicity Continue Acetaminophen as needed for pain.   No orders of the defined types were placed in this encounter.   No orders of the defined types were placed in this encounter.   Referral Orders  No referral(s) requested today   Kathe Becton,  MSN, FNP-BC [facility] [place], [place] [phone]   Follow Up Instructions:  She will follow up in 3 months with her PCP, Lanae Boast, NP.     I discussed the assessment and treatment plan with the patient. The patient was provided an opportunity to ask questions and all were answered. The patient agreed with the plan and demonstrated an understanding of the instructions.   The patient was advised to call back or seek an in-person evaluation if the symptoms worsen or if the condition fails to improve as anticipated.  I provided 20 minutes of non-face-to-face time during this encounter.   Azzie Glatter, FNP

## 2018-07-22 ENCOUNTER — Other Ambulatory Visit: Payer: No Typology Code available for payment source

## 2018-08-24 ENCOUNTER — Telehealth: Payer: Self-pay

## 2018-08-24 NOTE — Telephone Encounter (Signed)
Patient called and is asking for some pain medication for her hands for a skin condition she has. She is waiting to get in with dermatology. Please advise if this can be done or if she needs an appointment. Thanks!

## 2018-08-25 NOTE — Telephone Encounter (Signed)
Has she ever been seen by me for this? If not, she will need an appointment. Please let her know that this provider does not prescribe pain medication for skin conditions.

## 2018-08-25 NOTE — Telephone Encounter (Signed)
Called and spoke with patient and advised that she will need to be seen for this and we typically don't prescribe pain meds for skin conditions. Patient verbalized understanding. Thanks !

## 2018-08-26 ENCOUNTER — Ambulatory Visit (INDEPENDENT_AMBULATORY_CARE_PROVIDER_SITE_OTHER): Payer: Self-pay | Admitting: Family Medicine

## 2018-08-26 ENCOUNTER — Encounter: Payer: Self-pay | Admitting: Family Medicine

## 2018-08-26 ENCOUNTER — Other Ambulatory Visit: Payer: Self-pay

## 2018-08-26 VITALS — BP 115/69 | HR 74 | Temp 98.3°F | Resp 16 | Ht 60.0 in | Wt 149.0 lb

## 2018-08-26 DIAGNOSIS — M79642 Pain in left hand: Secondary | ICD-10-CM

## 2018-08-26 DIAGNOSIS — G8929 Other chronic pain: Secondary | ICD-10-CM

## 2018-08-26 MED ORDER — ACETAMINOPHEN-CODEINE #3 300-30 MG PO TABS: 1.0000 | ORAL_TABLET | Freq: Four times a day (QID) | ORAL | 0 refills | Status: AC | PRN

## 2018-08-26 MED ORDER — TRIAMCINOLONE ACETONIDE 0.5 % EX OINT: 1.0000 "application " | TOPICAL_OINTMENT | Freq: Two times a day (BID) | CUTANEOUS | 0 refills | Status: DC

## 2018-08-26 MED ORDER — VALACYCLOVIR HCL 1 G PO TABS: 1000.0000 mg | ORAL_TABLET | Freq: Two times a day (BID) | ORAL | 0 refills | Status: AC

## 2018-08-26 MED ORDER — IBUPROFEN 800 MG PO TABS: 800.0000 mg | ORAL_TABLET | Freq: Three times a day (TID) | ORAL | 0 refills | Status: DC | PRN

## 2018-08-26 NOTE — Patient Instructions (Signed)
Herpetic Whitlow Herpetic whitlow is an infection that affects the skin of the fingers or hand. It is caused by herpes simplex virus (HSV). The infection can come back (recur), but the first occurrence is usually the most severe. HSV commonly affects the mouth and genitals, but it can affect many other parts of the body. Some people who get herpetic whitlow are already infected with HSV. The virus spreads to the fingers or hand after they come in contact with saliva or herpes sores. What are the causes? This condition is caused by herpes simplex virus (HSV) type 1 or HSV type 2. You can get herpetic whitlow if your fingers or hand have contact with body fluids that are infected with either of these viruses. Body fluids include:  Discharge from a herpes sore.  Saliva.  Semen.  Vaginal fluid.  Vomit.  Urine.  Stool (feces). What increases the risk? This condition is more likely to develop in:  People who are already infected with HSV.  People who work in the health care industry, particularly in dentistry. Health care workers can get herpetic whitlow from touching the mouths of patients who have oral herpes.  Young children. This is because they often put fingers in their mouths.  Athletes who participate in contact sports, such as wrestling. What are the signs or symptoms? Symptoms of this condition usually develop 2-14 days after exposure to the virus. Symptoms may include:  Pain, burning, or tingling in the affected area.  Fever.  Redness and swelling in the affected area.  Small fluid-filled bumps around the infected area. These bumps will develop into sores or they may break open.  Swollen lymph nodes. Symptoms usually improve 7-10 days after they appear and can clear up within 3-4 weeks. How is this diagnosed? This condition may be diagnosed based on:  A physical exam.  Your medical history.  Tests that examine a sample of fluid from the infected area. These tests  check the fluid for signs of HSV.  Blood tests. How is this treated? Treatment is not needed for this condition because symptoms usually get better on their own. However, your health care provider may recommend taking antiviral medicines to relieve symptoms and prevent the disease from spreading. Antiviral medicines can be taken through an IV, by mouth (orally), or through a skin (topical) ointment. It is important to remember that the herpes virus cannot be completely eliminated from your system, but treatment can help to prevent future outbreaks. Follow these instructions at home: Prevent the spread of infection The infection can spread to other people. It can also spread to other areas of your body, especially to your mouth and your genital area. To keep it from spreading:  Cover the affected area with a bandage (dressing). Keep the dressing dry.  If you work in the health care or Designer, industrial/product, wear gloves.  Avoid close contact with other people until your sores heal and your symptoms are gone.  Do not share towels and washcloths.  Do not touch the bumps with your other hand or pick at your scabs.  Wash your hands often using soap and water. If soap and water are not available, use hand sanitizer.  Do not touch your eyes, mouth, or genital area unless you wash your hands first.  General instructions  Put ice on the affected area: ? Put ice in a plastic bag. ? Place a towel between your skin and the bag. ? Leave the ice on for 20 minutes, 2-3 times  per day.  Take or apply over-the-counter and prescription medicines only as told by your health care provider.  Keep all follow-up visits as told by your health care provider. This is important. How is this prevented? To prevent this condition:  If you work in the health care or dentistry field, wear gloves when working with patients.  Wash your hands often with soap and warm water. If soap and water are not available, use hand  sanitizer.  Use latex condoms every time you have sex.  Avoid putting fingers in your mouth unless you have cleaned them with soap and warm water. Contact a health care provider if:  The infection spreads to another area of your body.  Your symptoms get worse.  The infection returns. Summary  Herpetic whitlow is an infection that affects the skin of the fingers or hand.  This condition is caused by herpes simplex virus (HSV) type 1 or HSV type 2. You can get herpetic whitlow if your fingers come in contact with body fluids that are infected with either of these viruses.  Treatment is not needed for this condition because symptoms usually resolve on their own. However, your health care provider may recommend taking antiviral medicines.  The infection can spread to other people. It can also spread to other areas of your body, especially to your mouth and your genital area.  The herpes virus cannot be completely eliminated from your system, but treatment can help to prevent future outbreaks. This information is not intended to replace advice given to you by your health care provider. Make sure you discuss any questions you have with your health care provider. Document Released: 03/22/2002 Document Revised: 12/12/2016 Document Reviewed: 01/22/2016 Elsevier Patient Education  2020 [employer].

## 2018-08-26 NOTE — Progress Notes (Signed)
  [facility]   Progress Note: Sick Visit Provider: Lanae Boast, FNP  SUBJECTIVE:   Vanessa Butler is a 43 y.o. female who  has a past medical history of Grieving and Skin disorder.. Patient presents today for Hand Pain (left hand pain ) and Follow-up (3 month routine follow up )  Patient with a history of a skin condition on the left hand and foot.  She states that she has been seen by dermatology in the past and diagnosed with keratoderma. She reports that the area waxes and weans. Having a painful flare today and is requesting medications. Has tried several topical agents with minimal relief.  Review of Systems  Skin: Positive for rash.  All other systems reviewed and are negative.    OBJECTIVE: BP 115/69 (BP Location: Left Arm, Patient Position: Sitting, Cuff Size: Normal)   Pulse 74   Temp 98.3 F (36.8 C) (Oral)   Resp 16   Ht 5' (1.524 m)   Wt 149 lb (67.6 kg)   SpO2 100%   BMI 29.10 kg/m   Wt Readings from Last 3 Encounters:  08/26/18 149 lb (67.6 kg)  05/19/18 145 lb (65.8 kg)  05/09/18 130 lb (59 kg)     Physical Exam Vitals signs and nursing note reviewed.  Constitutional:      Appearance: Normal appearance. She is normal weight.  HENT:     Head: Normocephalic and atraumatic.  Skin:    General: Skin is warm and dry.     Findings: Lesion and rash present.  Neurological:     Mental Status: She is alert.         ASSESSMENT/PLAN:  1. Chronic hand pain, left - valACYclovir (VALTREX) 1000 MG tablet; Take 1 tablet (1,000 mg total) by mouth 2 (two) times daily for 7 days.  Dispense: 14 tablet; Refill: 0 - acetaminophen-codeine (TYLENOL #3) 300-30 MG tablet; Take 1 tablet by mouth every 6 (six) hours as needed for up to 7 days for moderate pain.  Dispense: 30 tablet; Refill: 0 - ibuprofen (ADVIL) 800 MG tablet; Take 1 tablet (800 mg total) by mouth every 8 (eight) hours as needed.  Dispense: 30 tablet;  Refill: 0       The patient was given clear instructions to go to ER or return to medical center if symptoms do not improve, worsen or new problems develop. The patient verbalized understanding and agreed with plan of care.   [name]. Vanessa Canary, FNP-BC [facility] [address] [place], [place] [address] [phone]     This note has been created with Dragon speech recognition software and smart phrase technology. Any transcriptional errors are unintentional.

## 2018-08-30 ENCOUNTER — Telehealth: Payer: Self-pay

## 2018-08-31 NOTE — Telephone Encounter (Signed)
Error

## 2018-09-01 ENCOUNTER — Other Ambulatory Visit: Payer: Self-pay

## 2018-09-01 DIAGNOSIS — N939 Abnormal uterine and vaginal bleeding, unspecified: Secondary | ICD-10-CM

## 2018-09-01 MED ORDER — MEDROXYPROGESTERONE ACETATE 10 MG PO TABS: 10.0000 mg | ORAL_TABLET | Freq: Every day | ORAL | 3 refills | Status: DC

## 2018-09-01 NOTE — Progress Notes (Signed)
Advised pt that her provera was sent to pharmacy and to follow up in oct/nov to get an appointment. Pt verbalized understanding. Had no questions.

## 2018-09-08 DIAGNOSIS — L858 Other specified epidermal thickening: Secondary | ICD-10-CM | POA: Diagnosis not present

## 2018-09-08 DIAGNOSIS — Q828 Other specified congenital malformations of skin: Secondary | ICD-10-CM | POA: Diagnosis not present

## 2018-09-09 ENCOUNTER — Ambulatory Visit: Payer: Medicaid Other | Admitting: Family Medicine

## 2018-09-22 ENCOUNTER — Encounter (HOSPITAL_COMMUNITY): Payer: Self-pay

## 2018-09-22 ENCOUNTER — Encounter (HOSPITAL_COMMUNITY): Payer: Self-pay | Admitting: *Deleted

## 2018-09-29 DIAGNOSIS — L858 Other specified epidermal thickening: Secondary | ICD-10-CM | POA: Diagnosis not present

## 2018-11-03 ENCOUNTER — Ambulatory Visit: Payer: Medicaid Other | Admitting: Family Medicine

## 2018-11-10 ENCOUNTER — Other Ambulatory Visit: Payer: Self-pay

## 2018-11-10 ENCOUNTER — Ambulatory Visit (INDEPENDENT_AMBULATORY_CARE_PROVIDER_SITE_OTHER): Payer: Medicaid Other | Admitting: Obstetrics and Gynecology

## 2018-11-10 ENCOUNTER — Encounter: Payer: Self-pay | Admitting: Obstetrics and Gynecology

## 2018-11-10 VITALS — BP 117/82 | HR 90 | Ht 60.0 in | Wt 150.0 lb

## 2018-11-10 DIAGNOSIS — M545 Low back pain, unspecified: Secondary | ICD-10-CM

## 2018-11-10 DIAGNOSIS — G8929 Other chronic pain: Secondary | ICD-10-CM | POA: Diagnosis not present

## 2018-11-10 DIAGNOSIS — N939 Abnormal uterine and vaginal bleeding, unspecified: Secondary | ICD-10-CM

## 2018-11-10 NOTE — Progress Notes (Signed)
   GYNECOLOGY OFFICE FOLLOW UP NOTE  History:  43 y.o. G3P0 here today for follow up for AUB.  Started on provera 12/19 for bleeding and has been on provera since. Reports she has occasional spotting but overall, she is doing well. Is having some back and leg pain. Reports she has doing a lot of bending with work and is concerned the provera is causing her pain.  Past Medical History:  Diagnosis Date  . Grieving   . Skin disorder     Past Surgical History:  Procedure Laterality Date  . FOOT SURGERY    . HAND SURGERY Left   . TUBAL LIGATION       Current Outpatient Medications:  .  acetaminophen (TYLENOL) 325 MG tablet, Take 650 mg by mouth every 6 (six) hours as needed., Disp: , Rfl:  .  medroxyPROGESTERone (PROVERA) 10 MG tablet, Take 1 tablet (10 mg total) by mouth daily., Disp: 30 tablet, Rfl: 3 .  triamcinolone ointment (KENALOG) 0.5 %, Apply 1 application topically 2 (two) times daily., Disp: 30 g, Rfl: 0 .  omeprazole (PRILOSEC) 20 MG capsule, Take 1 capsule (20 mg total) by mouth daily. (Patient not taking: Reported on 11/10/2018), Disp: 30 capsule, Rfl: 2  The following portions of the patient's history were reviewed and updated as appropriate: allergies, current medications, past family history, past medical history, past social history, past surgical history and problem list.   Review of Systems:  Pertinent items noted in HPI and remainder of comprehensive ROS otherwise negative.   Objective:  Physical Exam BP 117/82   Pulse 90   Ht 5' (1.524 m)   Wt 150 lb (68 kg)   LMP 10/27/2018 (Within Days)   BMI 29.29 kg/m  CONSTITUTIONAL: Well-developed, well-nourished female in no acute distress.  HENT:  Normocephalic, atraumatic. External right and left ear normal. Oropharynx is clear and moist EYES: Conjunctivae and EOM are normal. Pupils are equal, round, and reactive to light. No scleral icterus.  NECK: Normal range of motion, supple, no masses SKIN: Skin is warm and  dry. No rash noted. Not diaphoretic. No erythema. No pallor. NEUROLOGIC: Alert and oriented to person, place, and time. Normal reflexes, muscle tone coordination. No cranial nerve deficit noted. PSYCHIATRIC: Normal mood and affect. Normal behavior. Normal judgment and thought content. CARDIOVASCULAR: Normal heart rate noted RESPIRATORY: Effort normal, no problems with respiration noted ABDOMEN: Soft, no distention noted.   PELVIC: deferred MUSCULOSKELETAL: Normal range of motion. No edema noted.  Labs and Imaging No results found.  Assessment & Plan:  1. Abnormal uterine bleeding (AUB) - Happy with provera - Cont provera for now -Although patient has had good response with PO progestin for bleeding, reviewed low likelihood but possibility of AUB caused by malignancy or hyperplasia, recommend EMB to ensure no concerning process, she is agreeable to this plan - will have her return for EMB  2. Chronic midline low back pain, unspecified whether sciatica present encouraged walking and stretching To see PCP if continues  Routine preventative health maintenance measures emphasized. Please refer to After Visit Summary for other counseling recommendations.   Return in about 6 weeks (around 12/22/2018) for EMB.  Total face-to-face time with patient: 10 minutes. Over 50% of encounter was spent on counseling and coordination of care.  Feliz Beam, M.D. Attending [facility] [employer] [facility] manager)

## 2018-11-14 DIAGNOSIS — G4700 Insomnia, unspecified: Secondary | ICD-10-CM

## 2018-11-14 DIAGNOSIS — R739 Hyperglycemia, unspecified: Secondary | ICD-10-CM

## 2018-11-14 HISTORY — DX: Insomnia, unspecified: G47.00

## 2018-11-14 HISTORY — DX: Hyperglycemia, unspecified: R73.9

## 2018-12-06 ENCOUNTER — Encounter: Payer: Self-pay | Admitting: Family Medicine

## 2018-12-06 ENCOUNTER — Other Ambulatory Visit: Payer: Self-pay

## 2018-12-06 ENCOUNTER — Ambulatory Visit (INDEPENDENT_AMBULATORY_CARE_PROVIDER_SITE_OTHER): Payer: Medicaid Other | Admitting: Family Medicine

## 2018-12-06 VITALS — BP 120/71 | HR 98 | Temp 98.0°F | Ht 60.0 in | Wt 153.0 lb

## 2018-12-06 DIAGNOSIS — Z09 Encounter for follow-up examination after completed treatment for conditions other than malignant neoplasm: Secondary | ICD-10-CM

## 2018-12-06 DIAGNOSIS — Z0000 Encounter for general adult medical examination without abnormal findings: Secondary | ICD-10-CM | POA: Diagnosis not present

## 2018-12-06 DIAGNOSIS — F4321 Adjustment disorder with depressed mood: Secondary | ICD-10-CM

## 2018-12-06 DIAGNOSIS — R739 Hyperglycemia, unspecified: Secondary | ICD-10-CM | POA: Diagnosis not present

## 2018-12-06 DIAGNOSIS — G8929 Other chronic pain: Secondary | ICD-10-CM | POA: Diagnosis not present

## 2018-12-06 DIAGNOSIS — M79642 Pain in left hand: Secondary | ICD-10-CM

## 2018-12-06 DIAGNOSIS — G4700 Insomnia, unspecified: Secondary | ICD-10-CM | POA: Diagnosis not present

## 2018-12-06 LAB — POCT URINALYSIS DIPSTICK
Bilirubin, UA: NEGATIVE
Glucose, UA: NEGATIVE
Ketones, UA: NEGATIVE
Leukocytes, UA: NEGATIVE
Nitrite, UA: NEGATIVE
Protein, UA: NEGATIVE
Spec Grav, UA: 1.025 (ref 1.010–1.025)
Urobilinogen, UA: 1 E.U./dL
pH, UA: 7 (ref 5.0–8.0)

## 2018-12-06 LAB — POCT GLYCOSYLATED HEMOGLOBIN (HGB A1C): Hemoglobin A1C: 5.7 % — AB (ref 4.0–5.6)

## 2018-12-06 LAB — GLUCOSE, POCT (MANUAL RESULT ENTRY): POC Glucose: 114 mg/dl — AB (ref 70–99)

## 2018-12-06 MED ORDER — TRAZODONE HCL 50 MG PO TABS: 50.0000 mg | ORAL_TABLET | Freq: Every day | ORAL | 3 refills | Status: DC

## 2018-12-06 NOTE — Patient Instructions (Signed)
Managing Loss, Adult People experience loss in many different ways throughout their lives. Events such as moving, changing jobs, and losing friends can create a sense of loss. The loss may be as serious as a major health change, divorce, death of a pet, or death of a loved one. All of these types of loss are likely to create a physical and emotional reaction known as grief. Grief is the result of a major change or an absence of something or someone that you count on. Grief is a normal reaction to loss. A variety of factors can affect your grieving experience, including:  The nature of your loss.  Your relationship to what or whom you lost.  Your understanding of grief and how to manage it.  Your support system. How to manage lifestyle changes Keep to your normal routine as much as possible.  If you have trouble focusing or doing normal activities, it is acceptable to take some time away from your normal routine.  Spend time with friends and loved ones.  Eat a healthy diet, get plenty of sleep, and rest when you feel tired. How to recognize changes  The way that you deal with your grief will affect your ability to function as you normally do. When grieving, you may experience these changes:  Numbness, shock, sadness, anxiety, anger, denial, and guilt.  Thoughts about death.  Unexpected crying.  A physical sensation of emptiness in your stomach.  Problems sleeping and eating.  Tiredness (fatigue).  Loss of interest in normal activities.  Dreaming about or imagining seeing the person who died.  A need to remember what or whom you lost.  Difficulty thinking about anything other than your loss for a period of time.  Relief. If you have been expecting the loss for a while, you may feel a sense of relief when it happens. Follow these instructions at home:  Activity Express your feelings in healthy ways, such as:  Talking with others about your loss. It may be helpful to find  others who have had a similar loss, such as a support group.  Writing down your feelings in a journal.  Doing physical activities to release stress and emotional energy.  Doing creative activities like painting, sculpting, or playing or listening to music.  Practicing resilience. This is the ability to recover and adjust after facing challenges. Reading some resources that encourage resilience may help you to learn ways to practice those behaviors. General instructions  Be patient with yourself and others. Allow the grieving process to happen, and remember that grieving takes time. ? It is likely that you may never feel completely done with some grief. You may find a way to move on while still cherishing memories and feelings about your loss. ? Accepting your loss is a process. It can take months or longer to adjust.  Keep all follow-up visits as told by your health care provider. This is important. Where to find support To get support for managing loss:  Ask your health care provider for help and recommendations, such as grief counseling or therapy.  Think about joining a support group for people who are managing a loss. Where to find more information You can find more information about managing loss from:  American Society of Clinical Oncology: www.cancer.net  American Psychological Association: www.apa.org Contact a health care provider if:  Your grief is extreme and keeps getting worse.  You have ongoing grief that does not improve.  Your body shows symptoms of grief, such   as illness.  You feel depressed, anxious, or lonely. Get help right away if:  You have thoughts about hurting yourself or others. If you ever feel like you may hurt yourself or others, or have thoughts about taking your own life, get help right away. You can go to your nearest emergency department or call:  Your local emergency services (911 in the [place]  A suicide crisis helpline, such as the  [place] at 220-883-0403. This is open 24 hours a day. Summary  Grief is the result of a major change or an absence of someone or something that you count on. Grief is a normal reaction to loss.  The depth of grief and the period of recovery depend on the type of loss and your ability to adjust to the change and process your feelings.  Processing grief requires patience and a willingness to accept your feelings and talk about your loss with people who are supportive.  It is important to find resources that work for you and to realize that people experience grief differently. There is not one grieving process that works for everyone in the same way.  Be aware that when grief becomes extreme, it can lead to more severe issues like isolation, depression, anxiety, or suicidal thoughts. Talk with your health care provider if you have any of these issues. This information is not intended to replace advice given to you by your health care provider. Make sure you discuss any questions you have with your health care provider. Document Released: 05/15/2016 Document Revised: 03/05/2018 Document Reviewed: 05/15/2016 Elsevier Patient Education  [place].

## 2018-12-06 NOTE — Progress Notes (Signed)
[facility]   Established Patient Office Visit  Subjective:  Patient ID: Vanessa Butler, female    DOB: 07-Feb-1975  Age: 43 y.o. MRN: [id number]  CC:  Chief Complaint  Patient presents with  . Follow-up  . Back Pain    onset 2 mths    HPI Vanessa Butler is a 43 year old female who presents for Follow Up today.   Past Medical History:  Diagnosis Date  . Grieving   . Hyperglycemia 11/2018  . Insomnia 11/2018  . Skin disorder    Current Status: This will be Vanessa Butler's initial office visit with me. She was previously seeing Lanae Boast, NP for her PCP needs. Since he last office visit, she is doing well with no complaints. She is grieving because of the recent death of her son and nephew. She is unable to sleep at night. She recently had laser surgery on left hand surgery on 09/30/2018. She has c/o occasional back pain, which she does not take any medications for relief. She r/t her recent weight gain. She is currently smoking 1 pack of cigarettes daily. She denies fevers, chills, fatigue, recent infections, weight loss, and night sweats. She has not had any headaches, visual changes, dizziness, and falls. No chest pain, heart palpitations, cough and shortness of breath reported. No reports of GI problems such as nausea, vomiting, diarrhea, and constipation. She has no reports of blood in stools, dysuria and hematuria. No depression or anxiety reported today. She denies pain today.   Past Surgical History:  Procedure Laterality Date  . FOOT SURGERY    . HAND SURGERY Left   . TUBAL LIGATION      Family History  Problem Relation Age of Onset  . Diabetes Maternal Grandmother   . Cancer Maternal Grandmother   . Asthma Paternal Grandmother     Social History   Socioeconomic History  . Marital status: Single    Spouse name: Not on file  . Number of children: 3  . Years of education: Not on file  . Highest education  level: 10th grade  Occupational History  . Not on file  Social Needs  . Financial resource strain: Not on file  . Food insecurity    Worry: Not on file    Inability: Not on file  . Transportation needs    Medical: No    Non-medical: No  Tobacco Use  . Smoking status: Current Some Day Smoker    Packs/day: 1.00    Types: Cigarettes  . Smokeless tobacco: Never Used  Substance and Sexual Activity  . Alcohol use: Yes    Comment: occ  . Drug use: No  . Sexual activity: Yes    Birth control/protection: Surgical  Lifestyle  . Physical activity    Days per week: Not on file    Minutes per session: Not on file  . Stress: Not on file  Relationships  . Social Herbalist on phone: Not on file    Gets together: Not on file    Attends religious service: Not on file    Active member of club or organization: Not on file    Attends meetings of clubs or organizations: Not on file    Relationship status: Not on file  . Intimate partner violence    Fear of current or ex partner: Not on file    Emotionally abused: Not on file    Physically abused: Not  on file    Forced sexual activity: Not on file  Other Topics Concern  . Not on file  Social History Narrative  . Not on file    Outpatient Medications Prior to Visit  Medication Sig Dispense Refill  . acetaminophen (TYLENOL) 325 MG tablet Take 650 mg by mouth every 6 (six) hours as needed.    . medroxyPROGESTERone (PROVERA) 10 MG tablet Take 1 tablet (10 mg total) by mouth daily. 30 tablet 3  . omeprazole (PRILOSEC) 20 MG capsule Take 1 capsule (20 mg total) by mouth daily. 30 capsule 2  . triamcinolone ointment (KENALOG) 0.5 % Apply 1 application topically 2 (two) times daily. 30 g 0   No facility-administered medications prior to visit.     Allergies  Allergen Reactions  . Doxycycline Nausea Only    ROS Review of Systems  Constitutional: Negative.   HENT: Negative.   Eyes: Negative.   Respiratory: Negative.    Cardiovascular: Negative.   Gastrointestinal: Negative.   Endocrine: Negative.   Genitourinary: Negative.   Musculoskeletal: Negative.   Skin: Negative.   Allergic/Immunologic: Negative.   Neurological: Negative.   Hematological: Negative.   Psychiatric/Behavioral: Negative.       Objective:    Physical Exam  Constitutional: She appears well-developed and well-nourished.  HENT:  Head: Normocephalic and atraumatic.  Eyes: Conjunctivae are normal.  Neck: Normal range of motion. Neck supple.  Cardiovascular: Normal rate, regular rhythm, normal heart sounds and intact distal pulses.  Pulmonary/Chest: Effort normal and breath sounds normal.  Musculoskeletal: Normal range of motion.  Nursing note and vitals reviewed.   BP 120/71   Pulse 98   Temp 98 F (36.7 C) (Oral)   Ht 5' (1.524 m)   Wt 153 lb (69.4 kg)   SpO2 98%   BMI 29.88 kg/m  Wt Readings from Last 3 Encounters:  12/06/18 153 lb (69.4 kg)  11/10/18 150 lb (68 kg)  08/26/18 149 lb (67.6 kg)     There are no preventive care reminders to display for this patient.  There are no preventive care reminders to display for this patient.  Lab Results  Component Value Date   TSH 0.99 05/23/2016   Lab Results  Component Value Date   WBC 5.4 11/19/2017   HGB 10.6 (L) 11/19/2017   HCT 35.0 (L) 11/19/2017   MCV 96.4 11/19/2017   PLT 356 11/19/2017   Lab Results  Component Value Date   NA 137 11/19/2017   K 4.1 11/19/2017   CO2 22 11/19/2017   GLUCOSE 85 11/19/2017   BUN 5 (L) 11/19/2017   CREATININE 0.66 11/19/2017   BILITOT 0.4 09/04/2016   ALKPHOS 69 09/04/2016   AST 13 09/04/2016   ALT 11 09/04/2016   PROT 7.0 09/04/2016   ALBUMIN 4.1 09/04/2016   CALCIUM 8.9 11/19/2017   ANIONGAP 7 11/19/2017   Lab Results  Component Value Date   CHOL 134 09/04/2016   Lab Results  Component Value Date   HDL 35 (L) 09/04/2016   Lab Results  Component Value Date   LDLCALC 69 09/04/2016   Lab Results   Component Value Date   TRIG 149 09/04/2016   Lab Results  Component Value Date   CHOLHDL 3.8 09/04/2016   Lab Results  Component Value Date   HGBA1C 5.7 (A) 12/06/2018    Assessment & Plan:   1. Grieving Recent loss of her son. We will refer her to Hospice for grief counseling.   2. Insomnia, unspecified  type We will initiate Trazodone today.  - traZODone (DESYREL) 50 MG tablet; Take 1 tablet (50 mg total) by mouth at bedtime.  Dispense: 30 tablet; Refill: 3  3. Hyperglycemia Glucose is at 114 today.   4. Chronic hand pain, left  5. Health care maintenance - Urinalysis Dipstick - Glucose (CBG) - POCT HgB A1C  6. Follow up She will follow up in 6 months.    Meds ordered this encounter  Medications  . traZODone (DESYREL) 50 MG tablet    Sig: Take 1 tablet (50 mg total) by mouth at bedtime.    Dispense:  30 tablet    Refill:  3    Orders Placed This Encounter  Procedures  . Urinalysis Dipstick  . Glucose (CBG)  . POCT HgB A1C    Referral Orders  No referral(s) requested today    Kathe Becton,  MSN, FNP-BC [place] [place], [place] [address] [phone]- fax  Problem List Items Addressed This Visit      Other   Chronic hand pain, left   Relevant Medications   traZODone (DESYREL) 50 MG tablet    Other Visit Diagnoses    Grieving    -  Primary   Insomnia, unspecified type       Relevant Medications   traZODone (DESYREL) 50 MG tablet   Hyperglycemia       Health care maintenance       Relevant Orders   Urinalysis Dipstick (Completed)   Glucose (CBG) (Completed)   POCT HgB A1C (Completed)   Follow up          Meds ordered this encounter  Medications  . traZODone (DESYREL) 50 MG tablet    Sig: Take 1 tablet (50 mg total) by mouth at bedtime.    Dispense:  30 tablet    Refill:  3    Follow-up: Return in about 6 months (around  06/05/2019).    Azzie Glatter, FNP

## 2018-12-07 LAB — CBC WITH DIFFERENTIAL/PLATELET
Basophils Absolute: 0 10*3/uL (ref 0.0–0.2)
Basos: 1 %
EOS (ABSOLUTE): 0 10*3/uL (ref 0.0–0.4)
Eos: 0 %
Hematocrit: 40.6 % (ref 34.0–46.6)
Hemoglobin: 13.4 g/dL (ref 11.1–15.9)
Immature Grans (Abs): 0 10*3/uL (ref 0.0–0.1)
Immature Granulocytes: 0 %
Lymphocytes Absolute: 1.7 10*3/uL (ref 0.7–3.1)
Lymphs: 34 %
MCH: 32.6 pg (ref 26.6–33.0)
MCHC: 33 g/dL (ref 31.5–35.7)
MCV: 99 fL — ABNORMAL HIGH (ref 79–97)
Monocytes Absolute: 0.4 10*3/uL (ref 0.1–0.9)
Monocytes: 7 %
Neutrophils Absolute: 2.8 10*3/uL (ref 1.4–7.0)
Neutrophils: 58 %
Platelets: 351 10*3/uL (ref 150–450)
RBC: 4.11 x10E6/uL (ref 3.77–5.28)
RDW: 12.5 % (ref 11.7–15.4)
WBC: 4.9 10*3/uL (ref 3.4–10.8)

## 2018-12-07 LAB — VITAMIN B12: Vitamin B-12: 190 pg/mL — ABNORMAL LOW (ref 232–1245)

## 2018-12-07 LAB — COMPREHENSIVE METABOLIC PANEL
ALT: 10 IU/L (ref 0–32)
AST: 13 IU/L (ref 0–40)
Albumin/Globulin Ratio: 1.6 (ref 1.2–2.2)
Albumin: 4.3 g/dL (ref 3.8–4.8)
Alkaline Phosphatase: 82 IU/L (ref 39–117)
BUN/Creatinine Ratio: 19 (ref 9–23)
BUN: 14 mg/dL (ref 6–24)
Bilirubin Total: 0.3 mg/dL (ref 0.0–1.2)
CO2: 18 mmol/L — ABNORMAL LOW (ref 20–29)
Calcium: 8.9 mg/dL (ref 8.7–10.2)
Chloride: 104 mmol/L (ref 96–106)
Creatinine, Ser: 0.75 mg/dL (ref 0.57–1.00)
GFR calc Af Amer: 112 mL/min/{1.73_m2} (ref >59)
GFR calc non Af Amer: 97 mL/min/{1.73_m2} (ref >59)
Globulin, Total: 2.7 g/dL (ref 1.5–4.5)
Glucose: 91 mg/dL (ref 65–99)
Potassium: 4.5 mmol/L (ref 3.5–5.2)
Sodium: 138 mmol/L (ref 134–144)
Total Protein: 7 g/dL (ref 6.0–8.5)

## 2018-12-07 LAB — VITAMIN D 25 HYDROXY (VIT D DEFICIENCY, FRACTURES): Vit D, 25-Hydroxy: 14.2 ng/mL — ABNORMAL LOW (ref 30.0–100.0)

## 2018-12-07 LAB — LIPID PANEL
Chol/HDL Ratio: 4.4 ratio (ref 0.0–4.4)
Cholesterol, Total: 137 mg/dL (ref 100–199)
HDL: 31 mg/dL — ABNORMAL LOW (ref >39)
LDL Chol Calc (NIH): 88 mg/dL (ref 0–99)
Triglycerides: 93 mg/dL (ref 0–149)
VLDL Cholesterol Cal: 18 mg/dL (ref 5–40)

## 2018-12-07 LAB — THYROID PANEL WITH TSH
Free Thyroxine Index: 1.6 (ref 1.2–4.9)
T3 Uptake Ratio: 30 % (ref 24–39)
T4, Total: 5.3 ug/dL (ref 4.5–12.0)
TSH: 1.05 u[IU]/mL (ref 0.450–4.500)

## 2018-12-13 DIAGNOSIS — R739 Hyperglycemia, unspecified: Secondary | ICD-10-CM | POA: Insufficient documentation

## 2018-12-13 DIAGNOSIS — G4700 Insomnia, unspecified: Secondary | ICD-10-CM | POA: Insufficient documentation

## 2018-12-13 DIAGNOSIS — F4321 Adjustment disorder with depressed mood: Secondary | ICD-10-CM | POA: Insufficient documentation

## 2018-12-14 DIAGNOSIS — E559 Vitamin D deficiency, unspecified: Secondary | ICD-10-CM

## 2018-12-14 DIAGNOSIS — E538 Deficiency of other specified B group vitamins: Secondary | ICD-10-CM

## 2018-12-14 HISTORY — DX: Deficiency of other specified B group vitamins: E53.8

## 2018-12-14 HISTORY — DX: Vitamin D deficiency, unspecified: E55.9

## 2018-12-16 ENCOUNTER — Encounter: Payer: Self-pay | Admitting: Family Medicine

## 2018-12-16 ENCOUNTER — Other Ambulatory Visit: Payer: Self-pay | Admitting: Family Medicine

## 2018-12-16 DIAGNOSIS — E538 Deficiency of other specified B group vitamins: Secondary | ICD-10-CM

## 2018-12-16 DIAGNOSIS — E559 Vitamin D deficiency, unspecified: Secondary | ICD-10-CM

## 2018-12-16 MED ORDER — VITAMIN D (ERGOCALCIFEROL) 1.25 MG (50000 UNIT) PO CAPS: 50000.0000 [IU] | ORAL_CAPSULE | ORAL | 6 refills | Status: DC

## 2018-12-16 MED ORDER — VITAMIN B-12 100 MCG PO TABS: 100.0000 ug | ORAL_TABLET | Freq: Every day | ORAL | 6 refills | Status: DC

## 2018-12-17 ENCOUNTER — Telehealth: Payer: Self-pay

## 2018-12-17 NOTE — Telephone Encounter (Signed)
-----   Message from Azzie Glatter, [place] sent at 12/16/2018  7:06 PM EST ----- Vitamin B-12 level is decreased. Rx for Vitamin B-12 supplement sent to pharmacy today.   Vitamin D level is mildly decreased. Rx for Vitamin D sent to pharmacy today. She should include foods that are high in Vitamin D. These include: Salmon, Cod Liver Oil, Mushrooms, Canned Fish, Milk, and Egg Yolks.  All other labs are stable. Keep follow up appointment. Please inform patient.

## 2018-12-17 NOTE — Telephone Encounter (Signed)
Called and spoke with patient advised that vitamin B12 and vitamin D are low. Advised that supplement has been sent to pharmacy for both of these and to take as directed. Asked that she include vitamin D resources and gave examples listed. Advised that all other labs are stable and to keep next follow up appointment.

## 2018-12-24 ENCOUNTER — Other Ambulatory Visit (HOSPITAL_COMMUNITY)
Admission: RE | Admit: 2018-12-24 | Discharge: 2018-12-24 | Disposition: A | Discharge status: Home / Self Care | Payer: Medicaid Other | Source: Ambulatory Visit | Attending: Obstetrics and Gynecology | Admitting: Obstetrics and Gynecology

## 2018-12-24 ENCOUNTER — Other Ambulatory Visit: Payer: Self-pay

## 2018-12-24 ENCOUNTER — Encounter: Payer: Self-pay | Admitting: Obstetrics and Gynecology

## 2018-12-24 ENCOUNTER — Ambulatory Visit (INDEPENDENT_AMBULATORY_CARE_PROVIDER_SITE_OTHER): Payer: Medicaid Other | Admitting: Obstetrics and Gynecology

## 2018-12-24 VITALS — BP 121/82 | HR 96 | Wt 148.9 lb

## 2018-12-24 DIAGNOSIS — Z3202 Encounter for pregnancy test, result negative: Secondary | ICD-10-CM

## 2018-12-24 DIAGNOSIS — N840 Polyp of corpus uteri: Secondary | ICD-10-CM

## 2018-12-24 DIAGNOSIS — N939 Abnormal uterine and vaginal bleeding, unspecified: Secondary | ICD-10-CM | POA: Diagnosis not present

## 2018-12-24 LAB — POCT PREGNANCY, URINE: Preg Test, Ur: NEGATIVE

## 2018-12-24 MED ORDER — MEDROXYPROGESTERONE ACETATE 10 MG PO TABS: 10.0000 mg | ORAL_TABLET | Freq: Every day | ORAL | 5 refills | Status: DC

## 2018-12-24 NOTE — Progress Notes (Signed)
ENDOMETRIAL BIOPSY      Vanessa Butler is a 43 y.o. G3P0 here for endometrial biopsy.  The indications for endometrial biopsy were reviewed.  Risks of the biopsy including cramping, bleeding, infection, uterine perforation, inadequate specimen and need for additional procedures were discussed. The patient states she understands and agrees to undergo procedure today. Consent was signed. Time out was performed.   Indications: AUB Urine HCG: negative  A bivalve speculum was placed into the vagina and the cervix was easily visualized and was prepped with Betadine x2. A single-toothed tenaculum was placed on the anterior lip of the cervix to stabilize it. The 3 mm pipelle was introduced into the endometrial cavity without difficulty to a depth of 7 cm, and a moderate amount of tissue was obtained and sent to pathology. This was repeated for a total of 3 passes. The instruments were removed from the patient's vagina. Minimal bleeding from the cervix at the tenaculum was noted.   The patient tolerated the procedure well. Routine post-procedure instructions were given to the patient.    Will base further management on results of biopsy.  Feliz Beam, M.D. Attending [facility] Fish farm manager)

## 2018-12-27 LAB — SURGICAL PATHOLOGY

## 2019-02-02 ENCOUNTER — Encounter: Payer: Self-pay | Admitting: Obstetrics and Gynecology

## 2019-02-02 ENCOUNTER — Telehealth (INDEPENDENT_AMBULATORY_CARE_PROVIDER_SITE_OTHER): Payer: Medicaid Other | Admitting: Obstetrics and Gynecology

## 2019-02-02 ENCOUNTER — Other Ambulatory Visit: Payer: Self-pay

## 2019-02-02 DIAGNOSIS — N840 Polyp of corpus uteri: Secondary | ICD-10-CM

## 2019-02-02 DIAGNOSIS — N939 Abnormal uterine and vaginal bleeding, unspecified: Secondary | ICD-10-CM

## 2019-02-02 NOTE — Progress Notes (Signed)
    TELEHEALTH GYNECOLOGY VIRTUAL VIDEO VISIT ENCOUNTER NOTE  Provider location: [facility] at [place]   I connected with Vanessa Butler on 02/02/19 at  4:15 PM EST by MyChart Video Encounter at home and verified that I am speaking with the correct person using two identifiers.   I discussed the limitations, risks, security and privacy concerns of performing an evaluation and management service virtually and the availability of in person appointments. I also discussed with the patient that there may be a patient responsible charge related to this service. The patient expressed understanding and agreed to proceed.   History:  Vanessa Butler is a 44 y.o. G3P0 female being evaluated today for endometrial polyp found on EMB. Had bleeding for about a week and a half after procedure but otherwise doing well. She denies any abnormal vaginal discharge, bleeding, pelvic pain or other concerns.       Past Medical History:  Diagnosis Date  . Grieving   . Hyperglycemia 11/2018  . Insomnia 11/2018  . Skin disorder   . Vitamin B12 deficiency 12/2018  . Vitamin D deficiency 12/2018   Past Surgical History:  Procedure Laterality Date  . FOOT SURGERY    . HAND SURGERY Left   . TUBAL LIGATION     The following portions of the patient's history were reviewed and updated as appropriate: allergies, current medications, past family history, past medical history, past social history, past surgical history and problem list.   Review of Systems:  Pertinent items noted in HPI and remainder of comprehensive ROS otherwise negative.  Physical Exam:   General:  Alert, oriented and cooperative. Patient appears to be in no acute distress.  Mental Status: Normal mood and affect. Normal behavior. Normal judgment and thought content.   Respiratory: Normal respiratory effort, no problems with respiration noted  Rest of physical exam deferred due to type of encounter  Labs and Imaging   FINAL MICROSCOPIC DIAGNOSIS:   A. ENDOMETRIUM, BIOPSY:  - Benign endometrial type polyp.   Assessment and Plan:     1. Abnormal uterine bleeding (AUB) Doing well on provera  2. Endometrial polyp - Reviewed benign path, recommendation for removal, D&C hysteroscopy with polypectomy - she verbalizes understanding and is in agreement with plan - would prefer to wait until I am back from leave to schedule this - will get her on schedule for May/June and pre-op visit at that point - answered all questions    I discussed the assessment and treatment plan with the patient. The patient was provided an opportunity to ask questions and all were answered. The patient agreed with the plan and demonstrated an understanding of the instructions.   The patient was advised to call back or seek an in-person evaluation/go to the ED if the symptoms worsen or if the condition fails to improve as anticipated.  I provided 21 minutes of face-to-face time during this encounter.   Sloan Leiter, MD [facility], [place]

## 2019-02-02 NOTE — Progress Notes (Signed)
I connected with  Vanessa Butler on 02/02/19 at  4:15 PM EST by telephone and verified that I am speaking with the correct person using two identifiers.   I discussed the limitations, risks, security and privacy concerns of performing an evaluation and management service by telephone and the availability of in person appointments. I also discussed with the patient that there may be a patient responsible charge related to this service. The patient expressed understanding and agreed to proceed.  Annabell Howells, RN 02/02/2019  4:13 PM

## 2019-04-11 ENCOUNTER — Ambulatory Visit (INDEPENDENT_AMBULATORY_CARE_PROVIDER_SITE_OTHER): Payer: Medicaid Other | Admitting: Nurse Practitioner

## 2019-04-11 ENCOUNTER — Other Ambulatory Visit: Payer: Self-pay

## 2019-04-11 ENCOUNTER — Encounter: Payer: Self-pay | Admitting: Nurse Practitioner

## 2019-04-11 VITALS — BP 127/72 | HR 74 | Temp 98.9°F | Resp 16 | Ht 60.0 in | Wt 150.0 lb

## 2019-04-11 DIAGNOSIS — M67452 Ganglion, left hip: Secondary | ICD-10-CM | POA: Diagnosis not present

## 2019-04-11 NOTE — Progress Notes (Signed)
Acute Office Visit  Subjective:    Patient ID: Vanessa Butler, female    DOB: 11-25-1975, 44 y.o.   MRN: [id number]  Chief Complaint  Patient presents with  . Epidermal Cyst    in groin area    HPI Patient is in today for evaluation. She  has a past medical history of Grieving, Hyperglycemia (11/2018), Insomnia (11/2018), Skin disorder, Vitamin B12 deficiency (12/2018), and Vitamin D deficiency (12/2018).   Epidermal Cyst Patient complains of a subcutaneous nodule located over the thigh.  This has been present for 1 week.  There has been no associated symptoms of discharge, tenderness, sudden swelling, or pain.  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 She admits that she gained some weight. She denies headache, dizziness, visual changes, shortness of breath, dyspnea on exertion, chest pain, nausea, vomiting or any edema.     Past Medical History:  Diagnosis Date  . Grieving   . Hyperglycemia 11/2018  . Insomnia 11/2018  . Skin disorder   . Vitamin B12 deficiency 12/2018  . Vitamin D deficiency 12/2018    Past Surgical History:  Procedure Laterality Date  . FOOT SURGERY    . HAND SURGERY Left   . TUBAL LIGATION      Family History  Problem Relation Age of Onset  . Diabetes Maternal Grandmother   . Cancer Maternal Grandmother   . Asthma Paternal Grandmother     Social History   Socioeconomic History  . Marital status: Single    Spouse name: Not on file  . Number of children: 3  . Years of education: Not on file  . Highest education level: 10th grade  Occupational History  . Not on file  Tobacco Use  . Smoking status: Current Some Day Smoker    Packs/day: 1.00    Types: Cigarettes  . Smokeless tobacco: Never Used  Substance and Sexual Activity  . Alcohol use: Yes    Comment: occ  . Drug use: No  . Sexual activity: Yes    Birth control/protection: Surgical  Other Topics Concern  . Not on file  Social History Narrative  . Not on  file   Social Determinants of Health   Financial Resource Strain:   . Difficulty of Paying Living Expenses:   Food Insecurity:   . Worried About Charity fundraiser in the Last Year:   . Arboriculturist in the Last Year:   Transportation Needs:   . Film/video editor (Medical):   Marland Kitchen Lack of Transportation (Non-Medical):   Physical Activity:   . Days of Exercise per Week:   . Minutes of Exercise per Session:   Stress:   . Feeling of Stress :   Social Connections:   . Frequency of Communication with Friends and Family:   . Frequency of Social Gatherings with Friends and Family:   . Attends Religious Services:   . Active Member of Clubs or Organizations:   . Attends Archivist Meetings:   Marland Kitchen Marital Status:   Intimate Partner Violence:   . Fear of Current or Ex-Partner:   . Emotionally Abused:   Marland Kitchen Physically Abused:   . Sexually Abused:     Outpatient Medications Prior to Visit  Medication Sig Dispense Refill  . acetaminophen (TYLENOL) 325 MG tablet Take 650 mg by mouth every 6 (six) hours as needed.    . medroxyPROGESTERone (PROVERA) 10 MG tablet Take 1 tablet (10 mg total) by mouth daily. [place]  tablet 5  . naproxen sodium (ALEVE) 220 MG tablet Take 220 mg by mouth.    Marland Kitchen omeprazole (PRILOSEC) 20 MG capsule Take 1 capsule (20 mg total) by mouth daily. 30 capsule 2  . traZODone (DESYREL) 50 MG tablet Take 1 tablet (50 mg total) by mouth at bedtime. 30 tablet 3  . vitamin B-12 (CYANOCOBALAMIN) 100 MCG tablet Take 1 tablet (100 mcg total) by mouth daily. 30 tablet 6  . Vitamin D, Ergocalciferol, (DRISDOL) 1.25 MG (50000 UT) CAPS capsule Take 1 capsule (50,000 Units total) by mouth every 7 (seven) days. 5 capsule 6   No facility-administered medications prior to visit.    Allergies  Allergen Reactions  . Doxycycline Nausea Only    Review of Systems  All other systems reviewed and are negative.      Objective:    Physical Exam Skin:    General: Skin is  moist.          BP 127/72 (BP Location: Right Arm, Patient Position: Sitting, Cuff Size: Normal)   Pulse 74   Temp 98.9 F (37.2 C) (Oral)   Resp 16   Ht 5' (1.524 m)   Wt 150 lb (68 kg)   LMP 04/07/2019 Comment: spotting   SpO2 100%   BMI 29.29 kg/m  Wt Readings from Last 3 Encounters:  04/11/19 150 lb (68 kg)  12/24/18 148 lb 14.4 oz (67.5 kg)  12/06/18 153 lb (69.4 kg)    There are no preventive care reminders to display for this patient.  There are no preventive care reminders to display for this patient.   Lab Results  Component Value Date   TSH 1.050 12/06/2018   Lab Results  Component Value Date   WBC 4.9 12/06/2018   HGB 13.4 12/06/2018   HCT 40.6 12/06/2018   MCV 99 (H) 12/06/2018   PLT 351 12/06/2018   Lab Results  Component Value Date   NA 138 12/06/2018   K 4.5 12/06/2018   CO2 18 (L) 12/06/2018   GLUCOSE 91 12/06/2018   BUN 14 12/06/2018   CREATININE 0.75 12/06/2018   BILITOT 0.3 12/06/2018   ALKPHOS 82 12/06/2018   AST 13 12/06/2018   ALT 10 12/06/2018   PROT 7.0 12/06/2018   ALBUMIN 4.3 12/06/2018   CALCIUM 8.9 12/06/2018   ANIONGAP 7 11/19/2017   Lab Results  Component Value Date   CHOL 137 12/06/2018   Lab Results  Component Value Date   HDL 31 (L) 12/06/2018   Lab Results  Component Value Date   LDLCALC 88 12/06/2018   Lab Results  Component Value Date   TRIG 93 12/06/2018   Lab Results  Component Value Date   CHOLHDL 4.4 12/06/2018   Lab Results  Component Value Date   HGBA1C 5.7 (A) 12/06/2018       Assessment & Plan:   Problem List Items Addressed This Visit    None    Visit Diagnoses    Ganglion cyst of left groin    -  Primary   Warm compress Labs pending It patient instructed if she notices any further irritation or drainage to follow-up for culture   Relevant Orders   CBC with Differential/Platelet   Comp. Metabolic Panel (12)       No orders of the defined types were placed in this encounter.     Vevelyn Francois, NP

## 2019-04-11 NOTE — Patient Instructions (Signed)
Epidermal Cyst  An epidermal cyst is a sac made of skin tissue. The sac contains a substance called keratin. Keratin is a protein that is normally secreted through the hair follicles. When keratin becomes trapped in the top layer of skin (epidermis), it can form an epidermal cyst. Epidermal cysts can be found anywhere on your body. These cysts are usually harmless (benign), and they may not cause symptoms unless they become infected. What are the causes? This condition may be caused by:  A blocked hair follicle.  A hair that curls and re-enters the skin instead of growing straight out of the skin (ingrown hair).  A blocked pore.  Irritated skin.  An injury to the skin.  Certain conditions that are passed along from parent to child (inherited).  Human papillomavirus (HPV).  Long-term (chronic) sun damage to the skin. What increases the risk? The following factors may make you more likely to develop an epidermal cyst:  Having acne.  Being overweight.  Being 30-40 years old. What are the signs or symptoms? The only symptom of this condition may be a small, painless lump underneath the skin. When an epidermal cyst ruptures, it may become infected. Symptoms may include:  Redness.  Inflammation.  Tenderness.  Warmth.  Fever.  Keratin draining from the cyst. Keratin is grayish-white, bad-smelling substance.  Pus draining from the cyst. How is this diagnosed? This condition is diagnosed with a physical exam.  In some cases, you may have a sample of tissue (biopsy) taken from your cyst to be examined under a microscope or tested for bacteria.  You may be referred to a health care provider who specializes in skin care (dermatologist). How is this treated? In many cases, epidermal cysts go away on their own without treatment. If a cyst becomes infected, treatment may include:  Opening and draining the cyst, done by a health care provider. After draining, minor surgery to  remove the rest of the cyst may be done.  Antibiotic medicine.  Injections of medicines (steroids) that help to reduce inflammation.  Surgery to remove the cyst. Surgery may be done if the cyst: ? Becomes large. ? Bothers you. ? Has a chance of turning into cancer.  Do not try to open a cyst yourself. Follow these instructions at home:  Take over-the-counter and prescription medicines only as told by your health care provider.  If you were prescribed an antibiotic medicine, take it it as told by your health care provider. Do not stop using the antibiotic even if you start to feel better.  Keep the area around your cyst clean and dry.  Wear loose, dry clothing.  Avoid touching your cyst.  Check your cyst every day for signs of infection. Check for: ? Redness, swelling, or pain. ? Fluid or blood. ? Warmth. ? Pus or a bad smell.  Keep all follow-up visits as told by your health care provider. This is important. How is this prevented?  Wear clean, dry, clothing.  Avoid wearing tight clothing.  Keep your skin clean and dry. Take showers or baths every day. Contact a health care provider if:  Your cyst develops symptoms of infection.  Your condition is not improving or is getting worse.  You develop a cyst that looks different from other cysts you have had.  You have a fever. Get help right away if:  Redness spreads from the cyst into the surrounding area. Summary  An epidermal cyst is a sac made of skin tissue. These cysts are   usually harmless (benign), and they may not cause symptoms unless they become infected.  If a cyst becomes infected, treatment may include surgery to open and drain the cyst, or to remove it. Treatment may also include medicines by mouth or through an injection.  Take over-the-counter and prescription medicines only as told by your health care provider. If you were prescribed an antibiotic medicine, take it as told by your health care  provider. Do not stop using the antibiotic even if you start to feel better.  Contact a health care provider if your condition is not improving or is getting worse.  Keep all follow-up visits as told by your health care provider. This is important. This information is not intended to replace advice given to you by your health care provider. Make sure you discuss any questions you have with your health care provider. Document Revised: 04/22/2018 Document Reviewed: 07/13/2017 Elsevier Patient Education  2020 Elsevier Inc.  

## 2019-04-12 LAB — COMP. METABOLIC PANEL (12)
AST: 15 IU/L (ref 0–40)
Albumin/Globulin Ratio: 1.5 (ref 1.2–2.2)
Albumin: 4.4 g/dL (ref 3.8–4.8)
Alkaline Phosphatase: 87 IU/L (ref 39–117)
BUN/Creatinine Ratio: 10 (ref 9–23)
BUN: 8 mg/dL (ref 6–24)
Bilirubin Total: <0.2 mg/dL (ref 0.0–1.2)
Calcium: 9.3 mg/dL (ref 8.7–10.2)
Chloride: 104 mmol/L (ref 96–106)
Creatinine, Ser: 0.82 mg/dL (ref 0.57–1.00)
GFR calc Af Amer: 101 mL/min/{1.73_m2} (ref >59)
GFR calc non Af Amer: 87 mL/min/{1.73_m2} (ref >59)
Globulin, Total: 2.9 g/dL (ref 1.5–4.5)
Glucose: 84 mg/dL (ref 65–99)
Potassium: 4.1 mmol/L (ref 3.5–5.2)
Sodium: 138 mmol/L (ref 134–144)
Total Protein: 7.3 g/dL (ref 6.0–8.5)

## 2019-04-12 LAB — CBC WITH DIFFERENTIAL/PLATELET
Basophils Absolute: 0 10*3/uL (ref 0.0–0.2)
Basos: 0 %
EOS (ABSOLUTE): 0 10*3/uL (ref 0.0–0.4)
Eos: 1 %
Hematocrit: 40.8 % (ref 34.0–46.6)
Hemoglobin: 13.8 g/dL (ref 11.1–15.9)
Immature Grans (Abs): 0 10*3/uL (ref 0.0–0.1)
Immature Granulocytes: 0 %
Lymphocytes Absolute: 2 10*3/uL (ref 0.7–3.1)
Lymphs: 39 %
MCH: 33.7 pg — ABNORMAL HIGH (ref 26.6–33.0)
MCHC: 33.8 g/dL (ref 31.5–35.7)
MCV: 100 fL — ABNORMAL HIGH (ref 79–97)
Monocytes Absolute: 0.3 10*3/uL (ref 0.1–0.9)
Monocytes: 7 %
Neutrophils Absolute: 2.8 10*3/uL (ref 1.4–7.0)
Neutrophils: 53 %
Platelets: 378 10*3/uL (ref 150–450)
RBC: 4.1 x10E6/uL (ref 3.77–5.28)
RDW: 12.4 % (ref 11.7–15.4)
WBC: 5.2 10*3/uL (ref 3.4–10.8)

## 2019-06-06 ENCOUNTER — Ambulatory Visit: Payer: Medicaid Other | Admitting: Family Medicine

## 2019-06-15 DIAGNOSIS — R21 Rash and other nonspecific skin eruption: Secondary | ICD-10-CM | POA: Diagnosis not present

## 2019-06-15 DIAGNOSIS — Q828 Other specified congenital malformations of skin: Secondary | ICD-10-CM | POA: Diagnosis not present

## 2019-06-15 DIAGNOSIS — G8918 Other acute postprocedural pain: Secondary | ICD-10-CM | POA: Diagnosis not present

## 2019-06-15 DIAGNOSIS — B078 Other viral warts: Secondary | ICD-10-CM | POA: Diagnosis not present

## 2019-06-29 ENCOUNTER — Telehealth (INDEPENDENT_AMBULATORY_CARE_PROVIDER_SITE_OTHER): Payer: Medicaid Other | Admitting: Lactation Services

## 2019-06-29 ENCOUNTER — Telehealth: Payer: Self-pay | Admitting: Obstetrics and Gynecology

## 2019-06-29 DIAGNOSIS — N939 Abnormal uterine and vaginal bleeding, unspecified: Secondary | ICD-10-CM

## 2019-06-29 MED ORDER — MEDROXYPROGESTERONE ACETATE 10 MG PO TABS: 10.0000 mg | ORAL_TABLET | Freq: Every day | ORAL | 2 refills | Status: DC

## 2019-06-29 NOTE — Telephone Encounter (Signed)
Patient returned earlier call and LM on nurse voicemail to call her back.   Patient reports she only has 2 Provera pills left. She is taking them once a day and they are helping.   Spoke with Dr. Rosana Hoes. She approved Provera refills until patient is seen.   Patient notified that the front office will be calling to schedule her for follow up appointment to discuss her AUB. Patient voiced understanding.

## 2019-06-29 NOTE — Telephone Encounter (Signed)
Attempted to call patient back. She did not answer. LM for patient to call the office at her convenience.   Per notes patient need to be scheduled to see Dr. Rosana Hoes. Note sent to front office staff to schedule.

## 2019-06-29 NOTE — Telephone Encounter (Signed)
Received a call from patient requesting a Rx refill. She would like to speak to a nurse about what she needs to do.

## 2019-07-29 ENCOUNTER — Other Ambulatory Visit: Payer: Self-pay

## 2019-07-29 ENCOUNTER — Encounter: Payer: Self-pay | Admitting: Obstetrics and Gynecology

## 2019-07-29 ENCOUNTER — Ambulatory Visit (INDEPENDENT_AMBULATORY_CARE_PROVIDER_SITE_OTHER): Payer: Medicaid Other | Admitting: Obstetrics and Gynecology

## 2019-07-29 VITALS — BP 118/64 | HR 69 | Wt 145.3 lb

## 2019-07-29 DIAGNOSIS — N939 Abnormal uterine and vaginal bleeding, unspecified: Secondary | ICD-10-CM | POA: Diagnosis not present

## 2019-07-29 DIAGNOSIS — N840 Polyp of corpus uteri: Secondary | ICD-10-CM | POA: Diagnosis not present

## 2019-07-29 MED ORDER — MEDROXYPROGESTERONE ACETATE 10 MG PO TABS: 10.0000 mg | ORAL_TABLET | Freq: Every day | ORAL | 2 refills | Status: DC

## 2019-07-29 NOTE — Progress Notes (Signed)
GYNECOLOGY OFFICE FOLLOW UP NOTE  History:  44 y.o. G3P0 here today for follow up for AUB. Known endometrial polyp. Bleeding is improved on provera, taking 10 mg daily and reports she has 7-9 days of spotting.    Past Medical History:  Diagnosis Date  . Grieving   . Hyperglycemia 11/2018  . Insomnia 11/2018  . Skin disorder   . Vitamin B12 deficiency 12/2018  . Vitamin D deficiency 12/2018    Past Surgical History:  Procedure Laterality Date  . FOOT SURGERY    . HAND SURGERY Left   . TUBAL LIGATION       Current Outpatient Medications:  .  acetaminophen (TYLENOL) 325 MG tablet, Take 650 mg by mouth every 6 (six) hours as needed., Disp: , Rfl:  .  medroxyPROGESTERone (PROVERA) 10 MG tablet, Take 1 tablet (10 mg total) by mouth daily., Disp: 30 tablet, Rfl: 2 .  MELATONIN PO, Take by mouth at bedtime., Disp: , Rfl:  .  omeprazole (PRILOSEC) 20 MG capsule, Take 1 capsule (20 mg total) by mouth daily., Disp: 30 capsule, Rfl: 2 .  vitamin B-12 (CYANOCOBALAMIN) 100 MCG tablet, Take 1 tablet (100 mcg total) by mouth daily., Disp: 30 tablet, Rfl: 6 .  Vitamin D, Ergocalciferol, (DRISDOL) 1.25 MG (50000 UT) CAPS capsule, Take 1 capsule (50,000 Units total) by mouth every 7 (seven) days., Disp: 5 capsule, Rfl: 6 .  naproxen sodium (ALEVE) 220 MG tablet, Take 220 mg by mouth. (Patient not taking: Reported on 07/29/2019), Disp: , Rfl:  .  traZODone (DESYREL) 50 MG tablet, Take 1 tablet (50 mg total) by mouth at bedtime. (Patient not taking: Reported on 07/29/2019), Disp: 30 tablet, Rfl: 3  The following portions of the patient's history were reviewed and updated as appropriate: allergies, current medications, past family history, past medical history, past social history, past surgical history and problem list.   Review of Systems:  Pertinent items noted in HPI and remainder of comprehensive ROS otherwise negative.   Objective:  Physical Exam BP 118/64   Pulse 69   Wt 145 lb 4.8 oz  (65.9 kg)   LMP 06/26/2019 (Approximate)   BMI 28.38 kg/m  CONSTITUTIONAL: Well-developed, well-nourished female in no acute distress.  HENT:  Normocephalic, atraumatic. External right and left ear normal. Oropharynx is clear and moist EYES: Conjunctivae and EOM are normal. Pupils are equal, round, and reactive to light. No scleral icterus.  NECK: Normal range of motion, supple, no masses SKIN: Skin is warm and dry. No rash noted. Not diaphoretic. No erythema. No pallor. NEUROLOGIC: Alert and oriented to person, place, and time. Normal reflexes, muscle tone coordination. No cranial nerve deficit noted. PSYCHIATRIC: Normal mood and affect. Normal behavior. Normal judgment and thought content. CARDIOVASCULAR: Normal heart rate noted RESPIRATORY: Effort normal, no problems with respiration noted ABDOMEN: Soft, no distention noted.   PELVIC: deferred MUSCULOSKELETAL: Normal range of motion. No edema noted.  Labs and Imaging No results found.  Assessment & Plan:   1. Abnormal uterine bleeding (AUB) - Improved on provera - Would like to continue - Reviewed that we will not know if polyp is removed, that bleeding is secondary to polyp if she continues, but that there are minimal risks to continuing provera and if she desires to do so, happy with bleeding profile, can continue  2. Endometrial polyp - Recommend removal of polyp, very low risk of malignancy but that polyp may be contributing to bleeding - The risks of hysteroscopic polypectomy with D&C  were reviewed with the patient; including but not limited to: infection which may require antibiotics; bleeding which may require transfusion or re-operation; injury to surrounding organs; need for additional procedures; thromboembolic phenomenon and other postoperative/anesthesia complications. She is agreeable to plan. She is agreeable to a blood transfusion in the event of emergency. - Reviewed pre-op instructions, expected post-op course. She  understands she will need to be NPO after midnight the night prior to the procedure. She understands she will have a PAT visit. She understands she will be called by the administrative scheduler to schedule date/time for the above. Answered all questions, she will call with any issues.  Routine preventative health maintenance measures emphasized. Please refer to After Visit Summary for other counseling recommendations.   Return for no follow up needed at this time.  Total face-to-face time with patient: 22 minutes. Over 50% of encounter was spent on counseling and coordination of care.  Feliz Beam, M.D. Attending [facility] Fish farm manager)

## 2019-08-31 DIAGNOSIS — Q828 Other specified congenital malformations of skin: Secondary | ICD-10-CM | POA: Diagnosis not present

## 2019-09-12 ENCOUNTER — Encounter (HOSPITAL_BASED_OUTPATIENT_CLINIC_OR_DEPARTMENT_OTHER): Payer: Self-pay | Admitting: Obstetrics and Gynecology

## 2019-09-12 ENCOUNTER — Other Ambulatory Visit: Payer: Self-pay

## 2019-09-17 ENCOUNTER — Other Ambulatory Visit (HOSPITAL_COMMUNITY)
Admission: RE | Admit: 2019-09-17 | Discharge: 2019-09-17 | Disposition: A | Discharge status: Home / Self Care | Payer: Medicaid Other | Source: Ambulatory Visit | Attending: Obstetrics and Gynecology | Admitting: Obstetrics and Gynecology

## 2019-09-17 DIAGNOSIS — Z20822 Contact with and (suspected) exposure to covid-19: Secondary | ICD-10-CM | POA: Diagnosis not present

## 2019-09-17 DIAGNOSIS — Z01812 Encounter for preprocedural laboratory examination: Secondary | ICD-10-CM | POA: Diagnosis not present

## 2019-09-17 LAB — SARS CORONAVIRUS 2 (TAT 6-24 HRS): SARS Coronavirus 2: NEGATIVE

## 2019-09-20 ENCOUNTER — Other Ambulatory Visit (HOSPITAL_COMMUNITY): Payer: Medicaid Other

## 2019-09-21 ENCOUNTER — Ambulatory Visit (HOSPITAL_BASED_OUTPATIENT_CLINIC_OR_DEPARTMENT_OTHER)
Admission: RE | Admit: 2019-09-21 | Discharge: 2019-09-21 | Disposition: A | Discharge status: Home / Self Care | Payer: Medicaid Other | Attending: Obstetrics and Gynecology | Admitting: Obstetrics and Gynecology

## 2019-09-21 ENCOUNTER — Ambulatory Visit (HOSPITAL_BASED_OUTPATIENT_CLINIC_OR_DEPARTMENT_OTHER): Payer: Medicaid Other | Admitting: Certified Registered"

## 2019-09-21 ENCOUNTER — Other Ambulatory Visit: Payer: Self-pay

## 2019-09-21 ENCOUNTER — Encounter (HOSPITAL_BASED_OUTPATIENT_CLINIC_OR_DEPARTMENT_OTHER)
Admission: RE | Disposition: A | Discharge status: Home / Self Care | Payer: Self-pay | Source: Home / Self Care | Attending: Obstetrics and Gynecology

## 2019-09-21 ENCOUNTER — Encounter (HOSPITAL_BASED_OUTPATIENT_CLINIC_OR_DEPARTMENT_OTHER): Payer: Self-pay | Admitting: Obstetrics and Gynecology

## 2019-09-21 DIAGNOSIS — N840 Polyp of corpus uteri: Secondary | ICD-10-CM | POA: Diagnosis not present

## 2019-09-21 DIAGNOSIS — N939 Abnormal uterine and vaginal bleeding, unspecified: Secondary | ICD-10-CM | POA: Diagnosis not present

## 2019-09-21 DIAGNOSIS — K219 Gastro-esophageal reflux disease without esophagitis: Secondary | ICD-10-CM | POA: Insufficient documentation

## 2019-09-21 DIAGNOSIS — E559 Vitamin D deficiency, unspecified: Secondary | ICD-10-CM | POA: Diagnosis not present

## 2019-09-21 DIAGNOSIS — Z793 Long term (current) use of hormonal contraceptives: Secondary | ICD-10-CM | POA: Insufficient documentation

## 2019-09-21 DIAGNOSIS — F17210 Nicotine dependence, cigarettes, uncomplicated: Secondary | ICD-10-CM | POA: Insufficient documentation

## 2019-09-21 DIAGNOSIS — Z79899 Other long term (current) drug therapy: Secondary | ICD-10-CM | POA: Diagnosis not present

## 2019-09-21 DIAGNOSIS — B1920 Unspecified viral hepatitis C without hepatic coma: Secondary | ICD-10-CM | POA: Diagnosis not present

## 2019-09-21 HISTORY — DX: Gastro-esophageal reflux disease without esophagitis: K21.9

## 2019-09-21 HISTORY — PX: DILATATION & CURETTAGE/HYSTEROSCOPY WITH MYOSURE: SHX6511

## 2019-09-21 LAB — POCT PREGNANCY, URINE: Preg Test, Ur: NEGATIVE

## 2019-09-21 SURGERY — DILATATION & CURETTAGE/HYSTEROSCOPY WITH MYOSURE
Anesthesia: General | Site: Vagina

## 2019-09-21 MED ORDER — SOD CITRATE-CITRIC ACID 500-334 MG/5ML PO SOLN
ORAL | Status: AC
  Filled 2019-09-21: qty 30

## 2019-09-21 MED ORDER — PROPOFOL 10 MG/ML IV BOLUS
INTRAVENOUS | Status: AC
  Filled 2019-09-21: qty 20

## 2019-09-21 MED ORDER — MIDAZOLAM HCL 5 MG/5ML IJ SOLN
INTRAMUSCULAR | Status: DC | PRN
  Administered 2019-09-21: 2 mg via INTRAVENOUS

## 2019-09-21 MED ORDER — OXYCODONE HCL 5 MG/5ML PO SOLN: 5.0000 mg | Freq: Once | ORAL | Status: DC | PRN

## 2019-09-21 MED ORDER — FENTANYL CITRATE (PF) 100 MCG/2ML IJ SOLN
INTRAMUSCULAR | Status: AC
  Filled 2019-09-21: qty 2

## 2019-09-21 MED ORDER — MIDAZOLAM HCL 2 MG/2ML IJ SOLN
INTRAMUSCULAR | Status: AC
  Filled 2019-09-21: qty 2

## 2019-09-21 MED ORDER — AMISULPRIDE (ANTIEMETIC) 5 MG/2ML IV SOLN: 10.0000 mg | Freq: Once | INTRAVENOUS | Status: DC | PRN

## 2019-09-21 MED ORDER — PROPOFOL 10 MG/ML IV BOLUS
INTRAVENOUS | Status: DC | PRN
  Administered 2019-09-21: 150 mg via INTRAVENOUS

## 2019-09-21 MED ORDER — LACTATED RINGERS IV SOLN: INTRAVENOUS | Status: DC

## 2019-09-21 MED ORDER — FENTANYL CITRATE (PF) 100 MCG/2ML IJ SOLN
INTRAMUSCULAR | Status: DC | PRN
Start: 2019-09-21 — End: 2019-09-21
  Administered 2019-09-21: 25 ug via INTRAVENOUS

## 2019-09-21 MED ORDER — ONDANSETRON HCL 4 MG/2ML IJ SOLN
INTRAMUSCULAR | Status: AC
  Filled 2019-09-21: qty 10

## 2019-09-21 MED ORDER — IBUPROFEN 600 MG PO TABS: 600.0000 mg | ORAL_TABLET | Freq: Four times a day (QID) | ORAL | 1 refills | Status: DC | PRN

## 2019-09-21 MED ORDER — LIDOCAINE 2% (20 MG/ML) 5 ML SYRINGE
INTRAMUSCULAR | Status: AC
  Filled 2019-09-21: qty 25

## 2019-09-21 MED ORDER — POVIDONE-IODINE 10 % EX SWAB
2.0000 "application " | Freq: Once | CUTANEOUS | Status: AC
  Administered 2019-09-21: 2 via TOPICAL

## 2019-09-21 MED ORDER — PROMETHAZINE HCL 25 MG/ML IJ SOLN: 6.2500 mg | INTRAMUSCULAR | Status: DC | PRN

## 2019-09-21 MED ORDER — OXYCODONE HCL 5 MG PO TABS: 5.0000 mg | ORAL_TABLET | ORAL | 0 refills | Status: DC | PRN

## 2019-09-21 MED ORDER — ONDANSETRON HCL 4 MG/2ML IJ SOLN
INTRAMUSCULAR | Status: DC | PRN
  Administered 2019-09-21: 4 mg via INTRAVENOUS

## 2019-09-21 MED ORDER — LIDOCAINE HCL (CARDIAC) PF 100 MG/5ML IV SOSY
PREFILLED_SYRINGE | INTRAVENOUS | Status: DC | PRN
  Administered 2019-09-21: 80 mg via INTRAVENOUS

## 2019-09-21 MED ORDER — DEXAMETHASONE SODIUM PHOSPHATE 10 MG/ML IJ SOLN
INTRAMUSCULAR | Status: DC | PRN
  Administered 2019-09-21: 5 mg via INTRAVENOUS

## 2019-09-21 MED ORDER — SODIUM CHLORIDE 0.9 % IR SOLN
Status: DC | PRN
  Administered 2019-09-21: 3000 mL

## 2019-09-21 MED ORDER — SOD CITRATE-CITRIC ACID 500-334 MG/5ML PO SOLN
30.0000 mL | ORAL | Status: AC
  Administered 2019-09-21: 30 mL via ORAL

## 2019-09-21 MED ORDER — MEPERIDINE HCL 25 MG/ML IJ SOLN: 6.2500 mg | INTRAMUSCULAR | Status: DC | PRN

## 2019-09-21 MED ORDER — OXYCODONE HCL 5 MG PO TABS: 5.0000 mg | ORAL_TABLET | Freq: Once | ORAL | Status: DC | PRN

## 2019-09-21 MED ORDER — DEXAMETHASONE SODIUM PHOSPHATE 10 MG/ML IJ SOLN
INTRAMUSCULAR | Status: AC
  Filled 2019-09-21: qty 4

## 2019-09-21 MED ORDER — HYDROMORPHONE HCL 1 MG/ML IJ SOLN: 0.2500 mg | INTRAMUSCULAR | Status: DC | PRN

## 2019-09-21 MED ORDER — FENTANYL CITRATE (PF) 100 MCG/2ML IJ SOLN
50.0000 ug | Freq: Once | INTRAMUSCULAR | Status: AC
  Administered 2019-09-21: 25 ug via INTRAVENOUS

## 2019-09-21 MED ORDER — PHENYLEPHRINE 40 MCG/ML (10ML) SYRINGE FOR IV PUSH (FOR BLOOD PRESSURE SUPPORT)
PREFILLED_SYRINGE | INTRAVENOUS | Status: AC
  Filled 2019-09-21: qty 10

## 2019-09-21 SURGICAL SUPPLY — 19 items
ABLATOR SURESOUND NOVASURE (ABLATOR) IMPLANT
CATH ROBINSON RED A/P 16FR (CATHETERS) ×4 IMPLANT
DEVICE MYOSURE LITE (MISCELLANEOUS) ×4 IMPLANT
DEVICE MYOSURE REACH (MISCELLANEOUS) IMPLANT
GAUZE 4X4 16PLY RFD (DISPOSABLE) ×4 IMPLANT
GLOVE BIO SURGEON STRL SZ 6.5 (GLOVE) ×3 IMPLANT
GLOVE BIO SURGEONS STRL SZ 6.5 (GLOVE) ×1
GLOVE BIOGEL PI IND STRL 6.5 (GLOVE) ×2 IMPLANT
GLOVE BIOGEL PI IND STRL 7.0 (GLOVE) ×2 IMPLANT
GLOVE BIOGEL PI INDICATOR 6.5 (GLOVE) ×2
GLOVE BIOGEL PI INDICATOR 7.0 (GLOVE) ×2
GOWN STRL REUS W/TWL LRG LVL3 (GOWN DISPOSABLE) ×8 IMPLANT
KIT PROCEDURE FLUENT (KITS) ×4 IMPLANT
PACK VAGINAL MINOR WOMEN LF (CUSTOM PROCEDURE TRAY) ×4 IMPLANT
PAD OB MATERNITY 4.3X12.25 (PERSONAL CARE ITEMS) ×4 IMPLANT
PAD PREP 24X48 CUFFED NSTRL (MISCELLANEOUS) ×4 IMPLANT
SEAL ROD LENS SCOPE MYOSURE (ABLATOR) ×4 IMPLANT
SLEEVE SCD COMPRESS KNEE MED (MISCELLANEOUS) ×4 IMPLANT
TOWEL GREEN STERILE FF (TOWEL DISPOSABLE) ×8 IMPLANT

## 2019-09-21 NOTE — Anesthesia Postprocedure Evaluation (Signed)
Anesthesia Post Note  Patient: Vanessa Butler  Procedure(s) Performed: DILATATION & CURETTAGE/HYSTEROSCOPY WITH MYOSURE (N/A Vagina )     Patient location during evaluation: PACU Anesthesia Type: General Level of consciousness: awake and alert Pain management: pain level controlled Vital Signs Assessment: post-procedure vital signs reviewed and stable Respiratory status: spontaneous breathing, nonlabored ventilation, respiratory function stable and patient connected to nasal cannula oxygen Cardiovascular status: blood pressure returned to baseline and stable Postop Assessment: no apparent nausea or vomiting Anesthetic complications: no   No complications documented.  Last Vitals:  Vitals:   09/21/19 1454 09/21/19 1500  BP: 116/70 116/61  Pulse: (!) 102 84  Resp: 14 (!) 22  Temp: 37.1 C   SpO2: 97% 100%    Last Pain:  Vitals:   09/21/19 1500  TempSrc:   PainSc: Asleep                 Jamar Casagrande

## 2019-09-21 NOTE — H&P (Signed)
OB/GYN Pre-Op History and Physical  Vanessa Butler is a 44 y.o. G3P3 presenting for [facility] hysteroscopy, polypectomy for known abnormal uterine bleeding and endometrial polyp. Improved on provera but desires management, agreeable to recommendation for removal of polyp.       Past Medical History:  Diagnosis Date  . GERD (gastroesophageal reflux disease)   . Grieving   . Hyperglycemia 11/2018  . Insomnia 11/2018  . Skin disorder   . Vitamin B12 deficiency 12/2018  . Vitamin D deficiency 12/2018    Past Surgical History:  Procedure Laterality Date  . FOOT SURGERY    . HAND SURGERY Left   . TUBAL LIGATION      OB History  Gravida Para Term Preterm AB Living  3         2  SAB TAB Ectopic Multiple Live Births          3    # Outcome Date GA Lbr Len/2nd Weight Sex Delivery Anes PTL Lv  3 Gravida           2 Gravida           1 [place]             Social History   Socioeconomic History  . Marital status: Single    Spouse name: Not on file  . Number of children: 3  . Years of education: Not on file  . Highest education level: 10th grade  Occupational History  . Not on file  Tobacco Use  . Smoking status: Current Every Day Smoker    Packs/day: 1.00    Types: Cigarettes  . Smokeless tobacco: Never Used  Vaping Use  . Vaping Use: Never used  Substance and Sexual Activity  . Alcohol use: Yes    Comment: occ  . Drug use: No  . Sexual activity: Yes    Birth control/protection: Surgical  Other Topics Concern  . Not on file  Social History Narrative  . Not on file   Social Determinants of Health   Financial Resource Strain:   . Difficulty of Paying Living Expenses: Not on file  Food Insecurity: No Food Insecurity  . Worried About Charity fundraiser in the Last Year: Never true  . Ran Out of Food in the Last Year: Never true  Transportation Needs: No Transportation Needs  . Lack of Transportation (Medical): No  . Lack of Transportation (Non-Medical): No    Physical Activity:   . Days of Exercise per Week: Not on file  . Minutes of Exercise per Session: Not on file  Stress:   . Feeling of Stress : Not on file  Social Connections:   . Frequency of Communication with Friends and Family: Not on file  . Frequency of Social Gatherings with Friends and Family: Not on file  . Attends Religious Services: Not on file  . Active Member of Clubs or Organizations: Not on file  . Attends Archivist Meetings: Not on file  . Marital Status: Not on file    Family History  Problem Relation Age of Onset  . Diabetes Maternal Grandmother   . Cancer Maternal Grandmother   . Asthma Paternal Grandmother     Medications Prior to Admission  Medication Sig Dispense Refill Last Dose  . acetaminophen (TYLENOL) 325 MG tablet Take 650 mg by mouth every 6 (six) hours as needed.   Past Week at Unknown time  . medroxyPROGESTERone (PROVERA) 10 MG tablet Take 1 tablet (10 mg  total) by mouth daily. 30 tablet 2 09/20/2019 at Unknown time  . MELATONIN PO Take by mouth at bedtime.   09/20/2019 at Unknown time  . omeprazole (PRILOSEC) 20 MG capsule Take 1 capsule (20 mg total) by mouth daily. 30 capsule 2 09/20/2019 at Unknown time    Allergies  Allergen Reactions  . Doxycycline Nausea Only    Review of Systems: Negative except for what is mentioned in HPI.     Physical Exam: BP (!) 125/47   Pulse 86   Temp 98.6 F (37 C) (Oral)   Resp 16   Ht 5' (1.524 m)   Wt 68 kg   SpO2 100%   BMI 29.28 kg/m  CONSTITUTIONAL: Well-developed, well-nourished female in no acute distress.  HENT:  Normocephalic, atraumatic, External right and left ear normal. Oropharynx is clear and moist EYES: Conjunctivae and EOM are normal. Pupils are equal, round, and reactive to light. No scleral icterus.  NECK: Normal range of motion, supple, no masses SKIN: Skin is warm and dry. No rash noted. Not diaphoretic. No erythema. No pallor. Vanessa Butler: Alert and oriented to person,  place, and time. Normal reflexes, muscle tone coordination. No cranial nerve deficit noted. PSYCHIATRIC: Normal mood and affect. Normal behavior. Normal judgment and thought content. CARDIOVASCULAR: Normal heart rate noted RESPIRATORY: Effort normal, no problems with respiration noted ABDOMEN: Soft, nontender, nondistended  PELVIC: Deferred MUSCULOSKELETAL: Normal range of motion. No edema and no tenderness. 2+ distal pulses.   Pertinent Labs/Studies:   Results for orders placed or performed during the hospital encounter of 09/21/19 (from the past 72 hour(s))  Pregnancy, urine POC     Status: None   Collection Time: 09/21/19 12:40 PM  Result Value Ref Range   Preg Test, Ur NEGATIVE NEGATIVE    Comment:        THE SENSITIVITY OF THIS METHODOLOGY IS >24 mIU/mL    Path 12/2018: SURGICAL PATHOLOGY  CASE: [id number]  PATIENT: Vanessa Butler  Surgical Pathology Report   Clinical History: AUB (cm)   FINAL MICROSCOPIC DIAGNOSIS:   A. ENDOMETRIUM, BIOPSY:  - Benign endometrial type polyp.      Assessment and Plan :Vanessa Butler is a 44 y.o. G3P3 here for [facility] hysteroscopy, polypectomy for known endometrial polyp and abnormal uterine bleeding. Still having spotting on provera. Desires management.   The risks of hysteroscopic polypectomy with D&C were reviewed with the patient; including but not limited to: infection which may require antibiotics; bleeding which may require transfusion or re-operation; injury to bowel, bladder, ureters or other surrounding organs; need for additional procedures in the event of a life-threatening hemorrhage; thromboembolic phenomenon and other postoperative/anesthesia complications. Reviewed that all sampling will be sent to pathology and further management based on findings. The patient concurred with the proposed plan, giving informed consent for the procedure. She is agreeable to a blood transfusion in the event of emergency.   Patient is  NPO SCDs  Admission labs To OR when ready    K. Arvilla Meres, M.D. Attending [place], [facility] for [employer], [place]

## 2019-09-21 NOTE — Op Note (Signed)
Vanessa Butler PROCEDURE DATE: 09/21/2019   PREOPERATIVE DIAGNOSIS:  abnormal uterine bleeding, endometrial polyp  POSTOPERATIVE DIAGNOSIS: same  PROCEDURE: Operative Hysteroscopy, dilation and curettage  SURGEON:  Dr. Feliz Beam  ASSISTANT:  none  ANESTHESIOLOGY TEAM: Anesthesiologist: Janeece Riggers, MD CRNA: Lavonia Dana, CRNA  INDICATIONS: 44 y.o. G3P0  here for scheduled surgery for the aforementioned diagnoses.   Risks of surgery were discussed with the patient including but not limited to: bleeding which may require transfusion; infection which may require antibiotics; injury to uterus or surrounding organs; intrauterine scarring which may impair future fertility; need for additional procedures including laparotomy or laparoscopy; and other postoperative/anesthesia complications. Written informed consent was obtained.    FINDINGS: Normal appearing female genitalia with multiparous normal appearing cervix.  10 weeks size uterus. Thickened non-fluffy endometrium, non-uniform appearing with pitting in areas, no obvious polyps noted. Normal ostia bilaterally.  ANESTHESIA:   General INTRAVENOUS FLUIDS:  1000 ml of LR FLUID DEFICITS:  185 ml of normal saline ESTIMATED BLOOD LOSS:  5 ml URINE OUTPUT: 75 mL clear yellow urine SPECIMENS: endometrial curettings sent to pathology COMPLICATIONS:  None immediate.  PROCEDURE DETAILS:  The patient was seen in the pre-op area where the plan was reviewed and she again verbalized understanding and consent.  She was then taken to the operating room where general anesthesia was administered.  After an adequate timeout was performed, she was placed in the dorsal lithotomy position and examined; then prepped and draped in the sterile manner. A straight catheter was inserted into the bladder sterilely and the bladder was drained.  A weighted speculum was then placed in the patient's vagina and a Sims retractor used to visualize the cervix. A  single toothed tenaculum was applied to the anterior lip of the cervix.  The cervix then dilated manually with metal dilators to accommodate the 6 mm operative hysteroscope.  Once the cervix was dilated, the hysteroscope was inserted under direct visualization using normal saline as a suspension medium.  The uterine cavity was carefully examined with the findings as noted above. The operative channel was introduced into the cavity and the endometrium was resected using the [facility] lite device under direct visualization. Once the lining of the uterus was thinned, the cavity was again viewed and noted to be free of other pathology. The hysteroscope was then removed under direct visualization.  A serrated curettage was then performed to obtain a minimal amount of endometrial curettings. All samples were sent to pathology. The tenaculum was removed from the anterior lip of the cervix with good hemostasis noted at the tenaculum site. All instruments were remvoed from the vagina.  The patient tolerated the procedure well and was taken to the recovery area awake, extubated and in stable condition.  The patient will be discharged to home as per PACU criteria.  Routine postoperative instructions given.  She was prescribed Percocet, Ibuprofen and Colace.  She will follow up in the clinic in 2 weeks  for postoperative evaluation.    Feliz Beam, M.D. Attending [place], [facility] for [employer], [place]

## 2019-09-21 NOTE — Anesthesia Procedure Notes (Signed)
Procedure Name: LMA Insertion Date/Time: 09/21/2019 2:20 PM Performed by: Lavonia Dana, CRNA Pre-anesthesia Checklist: Patient identified, Emergency Drugs available, Suction available and Patient being monitored Patient Re-evaluated:Patient Re-evaluated prior to induction Oxygen Delivery Method: Circle system utilized Preoxygenation: Pre-oxygenation with 100% oxygen Induction Type: IV induction Ventilation: Mask ventilation without difficulty LMA: LMA inserted LMA Size: 4.0 Number of attempts: 1 Airway Equipment and Method: Bite block Placement Confirmation: positive ETCO2 Tube secured with: Tape Dental Injury: Teeth and Oropharynx as per pre-operative assessment

## 2019-09-21 NOTE — Anesthesia Preprocedure Evaluation (Signed)
Anesthesia Evaluation  Patient identified by MRN, date of birth, ID band Patient awake    Reviewed: Allergy & Precautions, NPO status , Patient's Chart, lab work & pertinent test results  Airway Mallampati: II  TM Distance: >3 FB Neck ROM: Full    Dental no notable dental hx.    Pulmonary neg pulmonary ROS, Current Smoker and Patient abstained from smoking.,    Pulmonary exam normal breath sounds clear to auscultation       Cardiovascular negative cardio ROS Normal cardiovascular exam Rhythm:Regular Rate:Normal     Neuro/Psych negative neurological ROS  negative psych ROS   GI/Hepatic Neg liver ROS, GERD  ,  Endo/Other  negative endocrine ROS  Renal/GU negative Renal ROS  negative genitourinary   Musculoskeletal negative musculoskeletal ROS (+)   Abdominal   Peds negative pediatric ROS (+)  Hematology negative hematology ROS (+)   Anesthesia Other Findings   Reproductive/Obstetrics negative OB ROS                             Anesthesia Physical Anesthesia Plan  ASA: II  Anesthesia Plan: General   Post-op Pain Management:    Induction: Intravenous  PONV Risk Score and Plan: 2 and Ondansetron, Midazolam and Treatment may vary due to age or medical condition  Airway Management Planned: LMA  Additional Equipment:   Intra-op Plan:   Post-operative Plan: Extubation in OR  Informed Consent: I have reviewed the patients History and Physical, chart, labs and discussed the procedure including the risks, benefits and alternatives for the proposed anesthesia with the patient or authorized representative who has indicated his/her understanding and acceptance.     Dental advisory given  Plan Discussed with: CRNA  Anesthesia Plan Comments:         Anesthesia Quick Evaluation

## 2019-09-21 NOTE — Transfer of Care (Signed)
Immediate Anesthesia Transfer of Care Note  Patient: Anda Sobotta  Procedure(s) Performed: DILATATION & CURETTAGE/HYSTEROSCOPY WITH MYOSURE (N/A Vagina )  Patient Location: PACU  Anesthesia Type:General  Level of Consciousness: awake, alert  and oriented  Airway & Oxygen Therapy: Patient Spontanous Breathing and Patient connected to face mask oxygen  Post-op Assessment: Report given to RN and Post -op Vital signs reviewed and stable  Post vital signs: Reviewed and stable  Last Vitals:  Vitals Value Taken Time  BP 116/70 09/21/19 1453  Temp    Pulse 95 09/21/19 1455  Resp 24 09/21/19 1455  SpO2 100 % 09/21/19 1455  Vitals shown include unvalidated device data.  Last Pain:  Vitals:   09/21/19 1257  TempSrc: Oral  PainSc: 0-No pain         Complications: No complications documented.

## 2019-09-21 NOTE — Discharge Instructions (Signed)
Dilation and Curettage, Care After These instructions give you information about caring for yourself after your procedure. Your doctor may also give you more specific instructions. Call your doctor if you have any problems or questions after your procedure. Follow these instructions at home: Activity  Do not drive or use heavy machinery while taking prescription pain medicine.  For 24 hours after your procedure, avoid driving.  Take short walks often, followed by rest periods. Ask your doctor what activities are safe for you. After one or two days, you may be able to return to your normal activities.  Do not lift anything that is heavier than 10 lb (4.5 kg) until your doctor approves.  For at least 2 weeks, or as long as told by your doctor: ? Do not douche. ? Do not use tampons.  ? Do not have sex. General instructions   Take over-the-counter and prescription medicines only as told by your doctor. This is very important if you take blood thinning medicine.  Do not take baths, swim, or use a hot tub until your doctor approves. Take showers instead of baths.  Wear compression stockings as told by your doctor.  It is up to you to get the results of your procedure. Ask your doctor when your results will be ready.  Keep all follow-up visits as told by your doctor. This is important. Contact a doctor if:  You have very bad cramps that get worse or do not get better with medicine.  You have very bad pain in your belly (abdomen).  You cannot drink fluids without throwing up (vomiting).  You get pain in a different part of the area between your belly and thighs (pelvis).  You have bad-smelling discharge from your vagina.  You have a rash. Get help right away if:  You are bleeding a lot from your vagina. A lot of bleeding means soaking more than one sanitary pad in an hour, for 2 hours in a row.  You have clumps of blood (blood clots) coming from your vagina.  You have a fever  or chills.  Your belly feels very tender or hard.  You have chest pain.  You have trouble breathing.  You cough up blood.  You feel dizzy.  You feel light-headed.  You pass out (faint).  You have pain in your neck or shoulder area. Summary  Take short walks often, followed by rest periods. Ask your doctor what activities are safe for you. After one or two days, you may be able to return to your normal activities.  Do not lift anything that is heavier than 10 lb (4.5 kg) until your doctor approves.  Do not take baths, swim, or use a hot tub until your doctor approves. Take showers instead of baths.  Contact your doctor if you have any symptoms of infection, like bad-smelling discharge from your vagina. This information is not intended to replace advice given to you by your health care provider. Make sure you discuss any questions you have with your health care provider. Document Revised: 12/12/2016 Document Reviewed: 09/17/2015 Elsevier Patient Education  [place] Instructions  Activity: Get plenty of rest for the remainder of the day. A responsible individual must stay with you for 24 hours following the procedure.  For the next 24 hours, DO NOT: -Drive a car -Paediatric nurse -Drink alcoholic beverages -Take any medication unless instructed by your physician -Make any legal decisions or sign important papers.  Meals: Start with liquid foods such as gelatin or soup. Progress to regular foods as tolerated. Avoid greasy, spicy, heavy foods. If nausea and/or vomiting occur, drink only clear liquids until the nausea and/or vomiting subsides. Call your physician if vomiting continues.  Special Instructions/Symptoms: Your throat may feel dry or sore from the anesthesia or the breathing tube placed in your throat during surgery. If this causes discomfort, gargle with warm salt water. The discomfort should disappear within 24  hours.  If you had a scopolamine patch placed behind your ear for the management of post- operative nausea and/or vomiting:  1. The medication in the patch is effective for 72 hours, after which it should be removed.  Wrap patch in a tissue and discard in the trash. Wash hands thoroughly with soap and water. 2. You may remove the patch earlier than 72 hours if you experience unpleasant side effects which may include dry mouth, dizziness or visual disturbances. 3. Avoid touching the patch. Wash your hands with soap and water after contact with the patch.

## 2019-09-21 NOTE — Brief Op Note (Signed)
09/21/2019  2:49 PM  PATIENT:  Vanessa Butler  44 y.o. female  PRE-OPERATIVE DIAGNOSIS:  AUB Polyp  POST-OPERATIVE DIAGNOSIS:  Abnormal Uterine Bleeding  PROCEDURE:  Procedure(s): DILATATION & CURETTAGE/HYSTEROSCOPY WITH MYOSURE (N/A)  SURGEON:  Surgeon(s) and Role:    * Sloan Leiter, MD - Primary  PHYSICIAN ASSISTANT:   ASSISTANTS: none   ANESTHESIA:   general  EBL:  5 mL   BLOOD ADMINISTERED:none  DRAINS: none   LOCAL MEDICATIONS USED:  NONE  SPECIMEN:  Source of Specimen:  endometrial curetting  DISPOSITION OF SPECIMEN:  PATHOLOGY  COUNTS:  YES  TOURNIQUET:  * No tourniquets in log *  DICTATION: .Note written in EPIC  PLAN OF CARE: Discharge to home after PACU  PATIENT DISPOSITION:  PACU - hemodynamically stable.   Delay start of Pharmacological VTE agent (>24hrs) due to surgical blood loss or risk of bleeding: no

## 2019-09-22 ENCOUNTER — Encounter (HOSPITAL_BASED_OUTPATIENT_CLINIC_OR_DEPARTMENT_OTHER): Payer: Self-pay | Admitting: Obstetrics and Gynecology

## 2019-09-23 LAB — SURGICAL PATHOLOGY

## 2019-09-28 ENCOUNTER — Other Ambulatory Visit: Payer: Self-pay

## 2019-09-28 ENCOUNTER — Other Ambulatory Visit: Payer: Medicaid Other

## 2019-09-28 DIAGNOSIS — Z20822 Contact with and (suspected) exposure to covid-19: Secondary | ICD-10-CM

## 2019-09-30 LAB — NOVEL CORONAVIRUS, NAA: SARS-CoV-2, NAA: NOT DETECTED

## 2019-09-30 LAB — SARS-COV-2, NAA 2 DAY TAT

## 2019-10-11 ENCOUNTER — Encounter: Payer: Self-pay | Admitting: Obstetrics and Gynecology

## 2019-10-11 ENCOUNTER — Ambulatory Visit (INDEPENDENT_AMBULATORY_CARE_PROVIDER_SITE_OTHER): Payer: Medicaid Other | Admitting: Obstetrics and Gynecology

## 2019-10-11 ENCOUNTER — Other Ambulatory Visit: Payer: Self-pay

## 2019-10-11 VITALS — BP 118/71 | HR 80 | Ht 60.0 in | Wt 151.0 lb

## 2019-10-11 DIAGNOSIS — Z9889 Other specified postprocedural states: Secondary | ICD-10-CM

## 2019-10-11 DIAGNOSIS — N840 Polyp of corpus uteri: Secondary | ICD-10-CM | POA: Diagnosis not present

## 2019-10-11 DIAGNOSIS — Z87898 Personal history of other specified conditions: Secondary | ICD-10-CM | POA: Diagnosis not present

## 2019-10-11 NOTE — Progress Notes (Signed)
GYNECOLOGY OFFICE FOLLOW UP NOTE  History:  44 y.o. G3P0 here today for follow up for D&C hysteroscopy, polypectomy on 09/21/19 for endometrial polyp. She is doing very well. Minimal pain, had some light bleeding. No issues with nausea/vomiting, using the restroom.   Started provera with some bleeding.  Past Medical History:  Diagnosis Date  . GERD (gastroesophageal reflux disease)   . Grieving   . Hyperglycemia 11/2018  . Insomnia 11/2018  . Skin disorder   . Vitamin B12 deficiency 12/2018  . Vitamin D deficiency 12/2018    Past Surgical History:  Procedure Laterality Date  . DILATATION & CURETTAGE/HYSTEROSCOPY WITH MYOSURE N/A 09/21/2019   Procedure: DILATATION & CURETTAGE/HYSTEROSCOPY WITH MYOSURE;  Surgeon: Sloan Leiter, MD;  Location: [facility];  Service: Gynecology;  Laterality: N/A;  . FOOT SURGERY    . HAND SURGERY Left   . TUBAL LIGATION       Current Outpatient Medications:  .  acetaminophen (TYLENOL) 325 MG tablet, Take 650 mg by mouth every 6 (six) hours as needed., Disp: , Rfl:  .  ibuprofen (ADVIL) 600 MG tablet, Take 1 tablet (600 mg total) by mouth every 6 (six) hours as needed for headache, mild pain, moderate pain or cramping., Disp: 30 tablet, Rfl: 1 .  medroxyPROGESTERone (PROVERA) 10 MG tablet, Take 10 mg by mouth daily., Disp: , Rfl:  .  MELATONIN PO, Take by mouth at bedtime., Disp: , Rfl:  .  omeprazole (PRILOSEC) 20 MG capsule, Take 1 capsule (20 mg total) by mouth daily., Disp: 30 capsule, Rfl: 2  The following portions of the patient's history were reviewed and updated as appropriate: allergies, current medications, past family history, past medical history, past social history, past surgical history and problem list.   Review of Systems:  Pertinent items noted in HPI and remainder of comprehensive ROS otherwise negative.   Objective:  Physical Exam BP 118/71   Pulse 80   Ht 5' (1.524 m)   Wt 151 lb (68.5 kg)   BMI 29.49 kg/m   CONSTITUTIONAL: Well-developed, well-nourished female in no acute distress.  HENT:  Normocephalic, atraumatic. External right and left ear normal. Oropharynx is clear and moist EYES: Conjunctivae and EOM are normal. Pupils are equal, round, and reactive to light. No scleral icterus.  NECK: Normal range of motion, supple, no masses SKIN: Skin is warm and dry. No rash noted. Not diaphoretic. No erythema. No pallor. NEUROLOGIC: Alert and oriented to person, place, and time. Normal reflexes, muscle tone coordination. No cranial nerve deficit noted. PSYCHIATRIC: Normal mood and affect. Normal behavior. Normal judgment and thought content. CARDIOVASCULAR: Normal heart rate noted RESPIRATORY: Effort normal, no problems with respiration noted ABDOMEN: Soft, no distention noted.   PELVIC: deferred MUSCULOSKELETAL: Normal range of motion. No edema noted.   Labs and Imaging No results found.  Assessment & Plan:   1. History of abnormal mammogram - US BREAST LTD UNI LEFT INC AXILLA; Future - US BREAST LTD UNI RIGHT INC AXILLA; Future - MM DIAG BREAST TOMO BILATERAL; Future  2. Postoperative state Doing well  3. Endometrial polyp Benign path May not need provera Stop for 3 months and return for followup  Routine preventative health maintenance measures emphasized. Please refer to After Visit Summary for other counseling recommendations.   Return in about 3 months (around 01/10/2020) for in person, Followup.  Total face-to-face time with patient: 16 minutes. Over 50% of encounter was spent on counseling and coordination of care.  K.  Arvilla Meres, M.D. Attending [facility] for [employer] Fish farm manager)

## 2019-10-27 ENCOUNTER — Ambulatory Visit
Admission: RE | Admit: 2019-10-27 | Discharge: 2019-10-27 | Disposition: A | Discharge status: Home / Self Care | Payer: Medicaid Other | Source: Ambulatory Visit | Attending: Obstetrics and Gynecology | Admitting: Obstetrics and Gynecology

## 2019-10-27 ENCOUNTER — Other Ambulatory Visit: Payer: Self-pay | Admitting: Obstetrics and Gynecology

## 2019-10-27 ENCOUNTER — Other Ambulatory Visit: Payer: Self-pay

## 2019-10-27 DIAGNOSIS — N6313 Unspecified lump in the right breast, lower outer quadrant: Secondary | ICD-10-CM | POA: Diagnosis not present

## 2019-10-27 DIAGNOSIS — R922 Inconclusive mammogram: Secondary | ICD-10-CM | POA: Diagnosis not present

## 2019-10-27 DIAGNOSIS — N6311 Unspecified lump in the right breast, upper outer quadrant: Secondary | ICD-10-CM | POA: Diagnosis not present

## 2019-10-27 DIAGNOSIS — Z87898 Personal history of other specified conditions: Secondary | ICD-10-CM

## 2019-10-27 DIAGNOSIS — N63 Unspecified lump in unspecified breast: Secondary | ICD-10-CM

## 2019-12-14 DIAGNOSIS — G8918 Other acute postprocedural pain: Secondary | ICD-10-CM | POA: Diagnosis not present

## 2019-12-14 DIAGNOSIS — Q828 Other specified congenital malformations of skin: Secondary | ICD-10-CM | POA: Diagnosis not present

## 2019-12-14 DIAGNOSIS — L720 Epidermal cyst: Secondary | ICD-10-CM | POA: Diagnosis not present

## 2019-12-14 DIAGNOSIS — L700 Acne vulgaris: Secondary | ICD-10-CM | POA: Diagnosis not present

## 2020-01-10 ENCOUNTER — Other Ambulatory Visit: Payer: Self-pay | Admitting: Obstetrics and Gynecology

## 2020-01-20 ENCOUNTER — Other Ambulatory Visit: Payer: Self-pay

## 2020-01-20 ENCOUNTER — Encounter (HOSPITAL_COMMUNITY): Payer: Self-pay | Admitting: Emergency Medicine

## 2020-01-20 ENCOUNTER — Ambulatory Visit (INDEPENDENT_AMBULATORY_CARE_PROVIDER_SITE_OTHER): Payer: Medicaid Other

## 2020-01-20 ENCOUNTER — Ambulatory Visit (HOSPITAL_COMMUNITY)
Admission: EM | Admit: 2020-01-20 | Discharge: 2020-01-20 | Disposition: A | Discharge status: Home / Self Care | Payer: Medicaid Other

## 2020-01-20 DIAGNOSIS — K5900 Constipation, unspecified: Secondary | ICD-10-CM | POA: Diagnosis not present

## 2020-01-20 DIAGNOSIS — R1030 Lower abdominal pain, unspecified: Secondary | ICD-10-CM | POA: Diagnosis not present

## 2020-01-20 DIAGNOSIS — R109 Unspecified abdominal pain: Secondary | ICD-10-CM | POA: Diagnosis not present

## 2020-01-20 DIAGNOSIS — M545 Low back pain, unspecified: Secondary | ICD-10-CM

## 2020-01-20 LAB — POCT URINALYSIS DIPSTICK, ED / UC
Bilirubin Urine: NEGATIVE
Glucose, UA: NEGATIVE mg/dL
Ketones, ur: NEGATIVE mg/dL
Leukocytes,Ua: NEGATIVE
Nitrite: NEGATIVE
Protein, ur: NEGATIVE mg/dL
Specific Gravity, Urine: 1.02 (ref 1.005–1.030)
Urobilinogen, UA: 0.2 mg/dL (ref 0.0–1.0)
pH: 6 (ref 5.0–8.0)

## 2020-01-20 LAB — POC URINE PREG, ED: Preg Test, Ur: NEGATIVE

## 2020-01-20 MED ORDER — KETOROLAC TROMETHAMINE 60 MG/2ML IM SOLN
INTRAMUSCULAR | Status: AC
  Filled 2020-01-20: qty 2

## 2020-01-20 MED ORDER — SENNOSIDES-DOCUSATE SODIUM 8.6-50 MG PO TABS: 1.0000 | ORAL_TABLET | Freq: Every day | ORAL | 0 refills | Status: DC

## 2020-01-20 MED ORDER — KETOROLAC TROMETHAMINE 60 MG/2ML IM SOLN
60.0000 mg | Freq: Once | INTRAMUSCULAR | Status: AC
  Administered 2020-01-20: 60 mg via INTRAMUSCULAR

## 2020-01-20 MED ORDER — POLYETHYLENE GLYCOL 3350 17 G PO PACK: 17.0000 g | PACK | Freq: Every day | ORAL | 0 refills | Status: DC

## 2020-01-20 MED ORDER — METHOCARBAMOL 500 MG PO TABS: 500.0000 mg | ORAL_TABLET | Freq: Two times a day (BID) | ORAL | 0 refills | Status: DC

## 2020-01-20 NOTE — ED Triage Notes (Signed)
Pt presents with low back pain xs 2 days. States unable to stand up straight and worse with walking.

## 2020-01-20 NOTE — Discharge Instructions (Addendum)
Continue to hydrate well with water intake laxative and drink MiraLAX until you are able to achieve a bulky stool to achieve relief of low back pain.  I have also prescribed you Robaxin which is a strong muscle relaxant to take as needed for pain.  Avoid any controlled medication as this can worsen the constipation symptoms.

## 2020-01-20 NOTE — ED Provider Notes (Signed)
[facility]    CSN: [id number] Arrival date & time: 01/20/20  1616      History   Chief Complaint Chief Complaint  Patient presents with  . Back Pain    HPI Vanessa Butler is a 45 y.o. female.   HPI  Patient presents today with back pain which is radiating around to her lower abdomen pelvic region.  Patient reports taking home ibuprofen 800 mg along with applying heat without any relief of symptoms.  Patient endorses that she has been constipated over the course of the last 4 to 5 days however she is able to pass gas.  She attempted relief with a stool softener and eating bulky vegetables and was able to produce a scant amount of stool.  She endorses drinking lots of water today however only was able to achieve 2 episodes of scant stool.  Patient reports last menstrual cycle was last week.  She has scant spotting that normally last about 5 to 7 days and last week it resolved after only a few days.  Past Medical History:  Diagnosis Date  . GERD (gastroesophageal reflux disease)   . Grieving   . Hyperglycemia 11/2018  . Insomnia 11/2018  . Skin disorder   . Vitamin B12 deficiency 12/2018  . Vitamin D deficiency 12/2018    Patient Active Problem List   Diagnosis Date Noted  . Grieving 12/13/2018  . Insomnia 12/13/2018  . Hyperglycemia 12/13/2018  . Hepatitis C antibody test positive 05/30/2018  . Left wrist sprain, initial encounter 05/20/2018  . Gastroesophageal reflux disease without esophagitis 05/20/2018  . Tobacco dependence 02/27/2016  . Viral warts 02/27/2016  . Neuropathy 02/27/2016  . Chronic hand pain, left 02/27/2016    Past Surgical History:  Procedure Laterality Date  . DILATATION & CURETTAGE/HYSTEROSCOPY WITH MYOSURE N/A 09/21/2019   Procedure: DILATATION & CURETTAGE/HYSTEROSCOPY WITH MYOSURE;  Surgeon: Sloan Leiter, MD;  Location: [facility];  Service: Gynecology;  Laterality: N/A;  . FOOT SURGERY    . HAND SURGERY Left    . TUBAL LIGATION      OB History    Gravida  3   Para      Term      Preterm      AB      Living  2     SAB      IAB      Ectopic      Multiple      Live Births  3            Home Medications    Prior to Admission medications   Medication Sig Start Date End Date Taking? Authorizing Provider  spironolactone (ALDACTONE) 50 MG tablet Take 50 mg by mouth daily. 12/14/19  Yes [provider]  acetaminophen (TYLENOL) 325 MG tablet Take 650 mg by mouth every 6 (six) hours as needed.    [provider]  ibuprofen (ADVIL) 600 MG tablet Take 1 tablet (600 mg total) by mouth every 6 (six) hours as needed for headache, mild pain, moderate pain or cramping. 09/21/19   Sloan Leiter, MD  medroxyPROGESTERone (PROVERA) 10 MG tablet TAKE 1 TABLET(10 MG) BY MOUTH DAILY 01/11/20   Sloan Leiter, MD  MELATONIN PO Take by mouth at bedtime.    [provider]  omeprazole (PRILOSEC) 20 MG capsule Take 1 capsule (20 mg total) by mouth daily. 05/19/18   Azzie Glatter, FNP    Family History Family History  Problem Relation Age of Onset  . Diabetes Maternal Grandmother   . Cancer Maternal Grandmother   . Asthma Paternal Grandmother     Social History Social History   Tobacco Use  . Smoking status: Current Every Day Smoker    Packs/day: 1.00    Types: Cigarettes  . Smokeless tobacco: Never Used  Vaping Use  . Vaping Use: Never used  Substance Use Topics  . Alcohol use: Yes    Comment: occ  . Drug use: No     Allergies   Doxycycline   Review of Systems Review of Systems Pertinent negatives listed in HPI   Physical Exam Triage Vital Signs ED Triage Vitals  Enc Vitals Group     BP 01/20/20 1747 123/64     Pulse Rate 01/20/20 1747 87     Resp 01/20/20 1747 17     Temp 01/20/20 1747 98.2 F (36.8 C)     Temp Source 01/20/20 1747 Oral     SpO2 01/20/20 1747 100 %     Weight --      Height --      Head Circumference --      Peak  Flow --      Pain Score 01/20/20 1744 10     Pain Loc --      Pain Edu? --      Excl. in [place]? --    No data found.  Updated Vital Signs BP 123/64 (BP Location: Right Arm)   Pulse 87   Temp 98.2 F (36.8 C) (Oral)   Resp 17   SpO2 100%   Visual Acuity Right Eye Distance:   Left Eye Distance:   Bilateral Distance:    Right Eye Near:   Left Eye Near:    Bilateral Near:     Physical Exam Constitutional:      Appearance: Normal appearance.  Cardiovascular:     Pulses: Normal pulses.  Abdominal:     General: Abdomen is protuberant. Bowel sounds are normal.    Musculoskeletal:       Back:  Skin:    General: Skin is warm and dry.     Capillary Refill: Capillary refill takes less than 2 seconds.  Neurological:     General: No focal deficit present.     Mental Status: She is alert and oriented to person, place, and time.  Psychiatric:        Attention and Perception: Attention normal.        Mood and Affect: Mood normal.        Cognition and Memory: Cognition normal.      UC Treatments / Results  Labs (all labs ordered are listed, but only abnormal results are displayed) Labs Reviewed - No data to display  EKG   Radiology DG Abdomen 1 View  Result Date: 01/20/2020 CLINICAL DATA:  Lower abdominal pain EXAM: ABDOMEN - 1 VIEW COMPARISON:  None. FINDINGS: The bowel gas pattern is normal. No radio-opaque calculi or other significant radiographic abnormality are seen. IMPRESSION: Negative. Electronically Signed   By: Macy Mis M.D.   On: 01/20/2020 18:54    Procedures Procedures (including critical care time)  Medications Ordered in UC Medications - No data to display  Initial Impression / Assessment and Plan / UC Course  I have reviewed the triage vital signs and the nursing notes.  Pertinent labs & imaging results that were available during my care of the patient were reviewed by me and considered in my medical  decision making (see chart for  details).     Abdominal 1 view unremarkable with the exception of some stool noted which is consistent with patient's report of constipation.  Abdominal 1 view was negative of any acute renal calculi which would be concerning for renal stone.  UA also unremarkable for that of a urinary tract infection.Will treat for constipation which I suspect is causing on lower pelvic and lower back pain or at least contributing to the worsening of pain in those areas.  Patient also given 1 dose of Toradol IM for pain management.  Encouraged use of prescribed laxative and MiraLAX to achieve a bulky stool.  Patient has a supply of opioid medication which I suspect is worsening constipation therefore advised to discontinue taking this medication while experiencing constipation as opioids will only exacerbate constipation symptoms.  Did prescribe Robaxin as needed for back pain.  Follow-up with primary care provider as needed.  If symptoms worsen or become severe go immediately to the closest emergency department for further work-up and evaluation   Final Clinical Impressions(s) / UC Diagnoses   Final diagnoses:  Constipation, unspecified constipation type  Acute bilateral low back pain without sciatica     Discharge Instructions     Continue to hydrate well with water intake laxative and drink MiraLAX until you are able to achieve a bulky stool to achieve relief of low back pain.  I have also prescribed you Robaxin which is a strong muscle relaxant to take as needed for pain.  Avoid any controlled medication as this can worsen the constipation symptoms.    ED Prescriptions    Medication Sig Dispense Auth. Provider   methocarbamol (ROBAXIN) 500 MG tablet Take 1 tablet (500 mg total) by mouth 2 (two) times daily. 20 tablet Scot Jun, FNP   senna-docusate (SENOKOT-S) 8.6-50 MG tablet Take 1-2 tablets by mouth at bedtime. 60 tablet Scot Jun, FNP   polyethylene glycol (MIRALAX MIX-IN PAX) 17 g  packet Take 17 g by mouth daily. 64 each Scot Jun, FNP     PDMP not reviewed this encounter.   Scot Jun, FNP 01/20/20 2120

## 2020-01-31 ENCOUNTER — Other Ambulatory Visit: Payer: Medicaid Other

## 2020-05-15 ENCOUNTER — Other Ambulatory Visit: Payer: Self-pay | Admitting: Obstetrics and Gynecology

## 2020-06-13 DIAGNOSIS — Q828 Other specified congenital malformations of skin: Secondary | ICD-10-CM | POA: Diagnosis not present

## 2020-06-13 DIAGNOSIS — G8918 Other acute postprocedural pain: Secondary | ICD-10-CM | POA: Diagnosis not present

## 2020-07-10 ENCOUNTER — Ambulatory Visit
Admission: EM | Admit: 2020-07-10 | Discharge: 2020-07-10 | Disposition: A | Discharge status: Home / Self Care | Payer: Medicaid Other | Attending: Emergency Medicine | Admitting: Emergency Medicine

## 2020-07-10 ENCOUNTER — Other Ambulatory Visit: Payer: Self-pay

## 2020-07-10 DIAGNOSIS — R519 Headache, unspecified: Secondary | ICD-10-CM | POA: Diagnosis not present

## 2020-07-10 DIAGNOSIS — J0120 Acute ethmoidal sinusitis, unspecified: Secondary | ICD-10-CM

## 2020-07-10 MED ORDER — AZITHROMYCIN 250 MG PO TABS: 250.0000 mg | ORAL_TABLET | Freq: Once | ORAL | 0 refills | Status: AC

## 2020-07-10 MED ORDER — PREDNISONE 10 MG (21) PO TBPK: ORAL_TABLET | Freq: Every day | ORAL | 0 refills | Status: DC

## 2020-07-10 NOTE — ED Triage Notes (Signed)
Patient presents to Urgent Care with complaints of congestion and headache x 7 days. She states she is concerned with possible sinus infection. Treating symptoms with cold/cough meds with no relief.  Denies fever.

## 2020-07-10 NOTE — ED Provider Notes (Signed)
[name]    CSN: [id number] Arrival date & time: 07/10/20  1128      History   Chief Complaint Chief Complaint  Patient presents with   Nasal Congestion    Sinus infection     HPI Vanessa Butler is a 45 y.o. female.   C/o facial pain and headache , sinus pressure for over a week now. Has taken several otc meds with no relief. Feels like she is running a fever but has not checked . No n/v/d no blurred vision.    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 05/30/2018   Left wrist sprain, initial encounter 05/20/2018   Gastroesophageal reflux disease without esophagitis 05/20/2018   Tobacco dependence 02/27/2016   Viral warts 02/27/2016   Neuropathy 02/27/2016   Chronic hand pain, left 02/27/2016    Past Surgical History:  Procedure Laterality Date   DILATATION & CURETTAGE/HYSTEROSCOPY WITH MYOSURE N/A 09/21/2019   Procedure: DILATATION & CURETTAGE/HYSTEROSCOPY WITH MYOSURE;  Surgeon: Sloan Leiter, MD;  Location: [facility];  Service: Gynecology;  Laterality: N/A;   FOOT SURGERY     HAND SURGERY Left    TUBAL LIGATION      OB History     Gravida  3   Para      Term      Preterm      AB      Living  2      SAB      IAB      Ectopic      Multiple      Live Births  3            Home Medications    Prior to Admission medications   Medication Sig Start Date End Date Taking? Authorizing Provider  acetaminophen (TYLENOL) 325 MG tablet Take 650 mg by mouth every 6 (six) hours as needed.    [provider]  ibuprofen (ADVIL) 600 MG tablet Take 1 tablet (600 mg  total) by mouth every 6 (six) hours as needed for headache, mild pain, moderate pain or cramping. 09/21/19   Sloan Leiter, MD  medroxyPROGESTERone (PROVERA) 10 MG tablet TAKE 1 TABLET(10 MG) BY MOUTH DAILY 05/16/20   Donnamae Jude, MD  MELATONIN PO Take by mouth at bedtime.    [provider]  methocarbamol (ROBAXIN) 500 MG tablet Take 1 tablet (500 mg total) by mouth 2 (two) times daily. 01/20/20   Scot Jun, FNP  omeprazole (PRILOSEC) 20 MG capsule Take 1 capsule (20 mg total) by mouth daily. 05/19/18   Azzie Glatter, FNP  polyethylene glycol (MIRALAX MIX-IN PAX) 17 g packet Take 17 g by mouth daily. 01/20/20   Scot Jun, FNP  senna-docusate (SENOKOT-S) 8.6-50 MG tablet Take 1-2 tablets by mouth at bedtime. 01/20/20   Scot Jun, FNP  spironolactone (ALDACTONE) 50 MG tablet Take 50 mg by mouth daily. 12/14/19   [provider]    Family History Family History  Problem Relation Age of Onset   Diabetes Maternal Grandmother    Cancer Maternal Grandmother  Asthma Paternal Grandmother     Social History Social History   Tobacco Use   Smoking status: Every Day    Packs/day: 1.00    Pack years: 0.00    Types: Cigarettes   Smokeless tobacco: Never  Vaping Use   Vaping Use: Never used  Substance Use Topics   Alcohol use: Yes    Comment: occ   Drug use: No     Allergies   Doxycycline   Review of Systems Review of Systems  Constitutional:  Positive for chills.  HENT:  Positive for congestion, postnasal drip, rhinorrhea, sinus pressure, sinus pain and sore throat. Negative for ear discharge and ear pain.   Eyes: Negative.   Respiratory: Negative.    Gastrointestinal: Negative.   Genitourinary: Negative.   Neurological:  Positive for headaches.    Physical Exam Triage Vital Signs ED Triage Vitals  Enc Vitals Group     BP 07/10/20 1301 127/61     Pulse Rate 07/10/20 1301 91     Resp 07/10/20 1301 16     Temp 07/10/20 1301 98.6 F  (37 C)     Temp Source 07/10/20 1301 Oral     SpO2 07/10/20 1301 98 %     Weight --      Height --      Head Circumference --      Peak Flow --      Pain Score 07/10/20 1303 7     Pain Loc --      Pain Edu? --      Excl. in [place]? --    No data found.  Updated Vital Signs BP 127/61 (BP Location: Left Arm)   Pulse 91   Temp 98.6 F (37 C) (Oral)   Resp 16   SpO2 98%   Visual Acuity Right Eye Distance:   Left Eye Distance:   Bilateral Distance:    Right Eye Near:   Left Eye Near:    Bilateral Near:     Physical Exam Constitutional:      Appearance: She is ill-appearing.  HENT:     Right Ear: Tympanic membrane normal.     Left Ear: Tympanic membrane normal.     Nose: Congestion and rhinorrhea present.     Mouth/Throat:     Mouth: Mucous membranes are moist.  Eyes:     Pupils: Pupils are equal, round, and reactive to light.  Cardiovascular:     Rate and Rhythm: Normal rate.  Pulmonary:     Effort: Pulmonary effort is normal.  Abdominal:     General: Abdomen is flat.  Musculoskeletal:     Cervical back: Normal range of motion.  Skin:    General: Skin is warm.  Neurological:     Mental Status: She is alert.     UC Treatments / Results  Labs (all labs ordered are listed, but only abnormal results are displayed) Labs Reviewed - No data to display  EKG   Radiology No results found.  Procedures Procedures (including critical care time)  Medications Ordered in UC Medications - No data to display  Initial Impression / Assessment and Plan / UC Course  I have reviewed the triage vital signs and the nursing notes.  Pertinent labs & imaging results that were available during my care of the patient were reviewed by me and considered in my medical decision making (see chart for details).     Stay hydrated  Use a humidifier to help  Take tylenol and  motrin as needed  Take otc allergy meds daily    Final Clinical Impressions(s) / UC Diagnoses   Final  diagnoses:  Acute non-recurrent ethmoidal sinusitis  Nonintractable headache, unspecified chronicity pattern, unspecified headache type   Discharge Instructions   None    ED Prescriptions   None    PDMP not reviewed this encounter.   Marney Setting, NP 07/10/20 1322

## 2020-08-01 ENCOUNTER — Telehealth (INDEPENDENT_AMBULATORY_CARE_PROVIDER_SITE_OTHER): Payer: Medicaid Other | Admitting: Nurse Practitioner

## 2020-08-01 ENCOUNTER — Encounter: Payer: Self-pay | Admitting: Nurse Practitioner

## 2020-08-01 DIAGNOSIS — K219 Gastro-esophageal reflux disease without esophagitis: Secondary | ICD-10-CM

## 2020-08-01 DIAGNOSIS — R61 Generalized hyperhidrosis: Secondary | ICD-10-CM

## 2020-08-01 MED ORDER — OMEPRAZOLE 20 MG PO CPDR: 20.0000 mg | DELAYED_RELEASE_CAPSULE | Freq: Two times a day (BID) | ORAL | 3 refills | Status: DC

## 2020-08-01 NOTE — Patient Instructions (Addendum)
Hyperhidrosis Hyperhidrosis is a condition in which the body sweats a lot more than normal (excessively). Sweating is a necessary function for a human body. It is normal to sweat when you are hot, physically active, or anxious. However, hyperhidrosis is sweating to an excessive degree. Although the condition is not a serious one,it can make you feel embarrassed. There are two kinds of hyperhidrosis: Primary hyperhidrosis. The sweating usually localizes in one part of your body, such as your underarms, or in a few areas, such as your feet, face, underarms, and hands. This is the more common kind of hyperhidrosis. Secondary hyperhidrosis. This type usually affects your entire body. What are the causes? The cause of this condition depends on the kind of hyperhidrosis that you have. Primary hyperhidrosis may be caused by sweat glands that are more active than normal. Secondary hyperhidrosis may be caused by an underlying condition or by taking certain medicines, such as antidepressants or diabetes medicines. Possible conditions that may cause secondary hyperhidrosis include: Diabetes. Gout. Anxiety. Obesity. Menopause. Overactive thyroid (hyperthyroidism). Tumors. Frostbite. Certain types of cancers. Alcoholism. Injury to your nervous system. Stroke. Parkinson's disease. What increases the risk? You are more likely to develop primary hyperhidrosis if you have a familyhistory of the condition. What are the signs or symptoms? Symptoms of this condition include: Feeling like you are sweating constantly, even while you are not being active. Having skin that peels or gets paler or softer in the areas where you sweat the most. Being able to see sweat on your skin. Other symptoms depend on the kind of hyperhidrosis that you have. Symptoms of primary hyperhidrosis may include: Sweating in the same location on both sides of your body. Sweating only during the day and not while you are  sleeping. Sweating in specific areas, such as your underarms, palms, feet, and face. Symptoms of secondary hyperhidrosis may include: Sweating all over your body. Sweating even while you sleep. How is this diagnosed? This condition may be diagnosed by: Medical history. Physical exam. You may also have other tests, including: Tests to measure the amount of sweat you produce and to show the areas where you sweat the most. These tests may involve: Using color-changing chemicals to show patterns of sweating on the skin. Weighing paper that has been applied to the skin. This will show the amount of sweat that your body produces. Measuring the amount of water that evaporates from the skin. Using infrared technology to show patterns of sweating on the skin. Tests to check for other conditions that may be causing excess sweating. This may include blood, urine, or imaging tests. How is this treated? Treatment for this condition depends on the kind of hyperhidrosis that you have and the areas of your body that are affected. Your health care provider willalso treat any underlying conditions. Treatment may include: Medicines, such as:  Antiperspirants. These are medicines that stop sweat. Injectable medicines. These may include small injections of botulinum toxin. Oral medicines. These are taken by mouth to treat underlying conditions and other symptoms. A procedure to: Temporarily turn off the sweat glands in your hands and feet (iontophoresis). Remove your sweat glands. Cut or destroy the nerves so that they do not send a signal to the sweat glands (sympathectomy). Follow these instructions at home: Lifestyle  Limit or avoid foods or beverages that may increase your risk of sweating, such as: Spicy food. Caffeine. Alcohol. Foods that contain monosodium glutamate (MSG). If your feet sweat: Wear sandals when possible. Do not wear  cotton socks. Wear socks that remove or wick moisture from  your feet. Wear leather shoes. Avoid wearing the same pair of shoes for two days in a row. Try placing sweat pads under your clothes to prevent underarm sweat from showing. Keep a journal of your sweat symptoms and when they occur. This may help you identify things that trigger your sweating.  General instructions Take over-the-counter and prescription medicines only as told by your health care provider. Use antiperspirants as told by your health care provider. Consider joining a hyperhidrosis support group. Keep all follow-up visits as told by your health care provider. This is important. Contact a health care provider if: You have new symptoms. Your symptoms get worse. Summary Hyperhidrosis is a condition in which the body sweats a lot more than normal (excessively). With primary hyperhidrosis, the sweating usually localizes in one part of your body, such as your underarms, or in a few areas, such as your feet, face, underarms, and hands. It is caused by overactive sweat glands in the affected area. With secondary hyperhidrosis, the sweating affects your entire body. This is caused by an underlying condition. Treatment for this condition depends on the kind of hyperhidrosis that you have and the parts of your body that are affected. This information is not intended to replace advice given to you by your health care provider. Make sure you discuss any questions you have with your healthcare provider. Document Revised: 10/26/2019 Document Reviewed: 10/26/2019 Elsevier Patient Education  2022 [employer] [phone] ed., pp. [phone]). [place], [place]: Elsevier.">  Perimenopause Perimenopause is the normal time of a woman's life when the levels of estrogen, the female hormone produced by the ovaries, begin to decrease. This leads to changes in menstrual periods before they stop completely (menopause). Perimenopause can begin 2-8 years before menopause. During  perimenopause,the ovaries may or may not produce an egg and a woman can still become pregnant. What are the causes? This condition is caused by a natural change in hormone levels that happens asyou get older. What increases the risk? This condition is more likely to start at an earlier age if you have certain medical conditions or have undergone treatments, including: A tumor of the pituitary gland in the brain. A disease that affects the ovaries and hormone production. Certain cancer treatments, such as chemotherapy or hormone therapy, or radiation therapy on the pelvis. Heavy smoking and excessive alcohol use. Family history of early menopause. What are the signs or symptoms? Perimenopausal changes affect each woman differently. Symptoms of this condition may include: Hot flashes. Irregular menstrual periods. Night sweats. Changes in feelings about sex. This could be a decrease in sex drive or an increased discomfort around your sexuality. Vaginal dryness. Headaches. Mood swings. Depression. Problems sleeping (insomnia). Memory problems or trouble concentrating. Irritability. Tiredness. Weight gain. Anxiety. Trouble getting pregnant. How is this diagnosed? This condition is diagnosed based on your medical history, a physical exam, your age, your menstrual history, and your symptoms. Hormone tests may also bedone. How is this treated? In some cases, no treatment is needed. You and your health care provider should make a decision together about whether treatment is necessary. Treatment will be based on your individual condition and preferences. Various treatments are available, such as: Menopausal hormone therapy (MHT). Medicines to treat specific symptoms. Acupuncture. Vitamin or herbal supplements. Before starting treatment, make sure to let your health care provider know if you have a personal or family history of: Heart disease. Breast  cancer. Blood  clots. Diabetes. Osteoporosis. Follow these instructions at home: Medicines Take over-the-counter and prescription medicines only as told by your health care provider. Take vitamin supplements only as told by your health care provider. Talk with your health care provider before starting any herbal supplements. Lifestyle  Do not use any products that contain nicotine or tobacco, such as cigarettes, e-cigarettes, and chewing tobacco. If you need help quitting, ask your health care provider. Get at least 30 minutes of physical activity on 5 or more days each week. Eat a balanced diet that includes fresh fruits and vegetables, whole grains, soybeans, eggs, lean meat, and low-fat dairy. Avoid alcoholic and caffeinated beverages, as well as spicy foods. This may help prevent hot flashes. Get 7-8 hours of sleep each night. Dress in layers that can be removed to help you manage hot flashes. Find ways to manage stress, such as deep breathing, meditation, or journaling.  General instructions  Keep track of your menstrual periods, including: When they occur. How heavy they are and how long they last. How much time passes between periods. Keep track of your symptoms, noting when they start, how often you have them, and how long they last. Use vaginal lubricants or moisturizers to help with vaginal dryness and improve comfort during sex. You can still become pregnant if you are having irregular periods. Make sure you use contraception during perimenopause if you do not want to get pregnant. Keep all follow-up visits. This is important. This includes any group therapy or counseling.  Contact a health care provider if: You have heavy vaginal bleeding or pass blood clots. Your period lasts more than 2 days longer than normal. Your periods are recurring sooner than 21 days. You bleed after having sex. You have pain during sex. Get help right away if you have: Chest pain, trouble breathing, or  trouble talking. Severe depression. Pain when you urinate. Severe headaches. Vision problems. Summary Perimenopause is the time when a woman's body begins to move into menopause. This may happen naturally or as a result of other health problems or medical treatments. Perimenopause can begin 2-8 years before menopause, and it can last for several years. Perimenopausal symptoms can be managed through medicines, lifestyle changes, and complementary therapies such as acupuncture. This information is not intended to replace advice given to you by your health care provider. Make sure you discuss any questions you have with your healthcare provider. Document Revised: 06/16/2019 Document Reviewed: 06/16/2019 Elsevier Patient Education  [place].

## 2020-08-01 NOTE — Progress Notes (Signed)
   [address], [place]  [address] Phone:  [phone]   Fax:  [phone] Virtual Visit via Telephone Note  I connected with Vanessa Butler on 08/01/20 at  3:40 PM EDT by telephone and verified that I am speaking with the correct person using two identifiers.   I discussed the limitations, risks, security and privacy concerns of performing an evaluation and management service by telephone and the availability of in person appointments. I also discussed with the patient that there may be a patient responsible charge related to this service. The patient expressed understanding and agreed to proceed.  Patient home Provider Office  History of Present Illness:  Vanessa Butler  has a past medical history of GERD (gastroesophageal reflux disease), Grieving, Hyperglycemia (11/2018), Insomnia (11/2018), Skin disorder, Vitamin B12 deficiency (12/2018), and Vitamin D deficiency (12/2018).    Hyperhidrosis Symptoms have been ongoing for about 6 months and have been gradually worsening. Her excessive sweating affects the  face, chest an back , and vaginal area and she describes it as severe. Treatments tried so far include no medication, She is cramping and spotting occasionally. She is on Provera 10 mg for a few years.   Review of Systems  Gastrointestinal:  Positive for heartburn.  Genitourinary:        Incontinence urinary      Observations/Objective: No exam; telephone visit   Problem List Items Addressed This Visit       Digestive   Gastroesophageal reflux disease without esophagitis   Relevant Medications   omeprazole (PRILOSEC) 20 MG capsule   Other Visit Diagnoses     Hyperhidrosis    -  Primary   Relevant Orders   Hormone Panel        Meds ordered this encounter  Medications   omeprazole (PRILOSEC) 20 MG capsule    Sig: Take 1 capsule (20 mg total) by mouth 2 (two) times daily before a meal.    Dispense:  180  capsule    Refill:  3     [place], NP    Follow Up Instructions: Follow-up as scheduled.   I discussed the assessment and treatment plan with the patient. The patient was provided an opportunity to ask questions and all were answered. The patient agreed with the plan and demonstrated an understanding of the instructions.   The patient was advised to call back or seek an in-person evaluation if the symptoms worsen or if the condition fails to improve as anticipated.  I provided 15 minutes of telephone- visit time during this encounter.   Vevelyn Francois, NP

## 2020-08-02 ENCOUNTER — Other Ambulatory Visit: Payer: Self-pay

## 2020-08-02 ENCOUNTER — Other Ambulatory Visit: Payer: Medicaid Other

## 2020-08-02 DIAGNOSIS — Z131 Encounter for screening for diabetes mellitus: Secondary | ICD-10-CM

## 2020-08-02 DIAGNOSIS — R61 Generalized hyperhidrosis: Secondary | ICD-10-CM | POA: Diagnosis not present

## 2020-08-05 LAB — MICROSCOPIC EXAMINATION
Casts: NONE SEEN /lpf
Epithelial Cells (non renal): >10 /hpf — AB (ref 0–10)
RBC, Urine: NONE SEEN /hpf (ref 0–2)

## 2020-08-05 LAB — UA/M W/RFLX CULTURE, ROUTINE
Bilirubin, UA: NEGATIVE
Glucose, UA: NEGATIVE
Leukocytes,UA: NEGATIVE
Nitrite, UA: NEGATIVE
RBC, UA: NEGATIVE
Specific Gravity, UA: >=1.03 — AB (ref 1.005–1.030)
Urobilinogen, Ur: 1 mg/dL (ref 0.2–1.0)
pH, UA: 6 (ref 5.0–7.5)

## 2020-08-05 LAB — URINE CULTURE, REFLEX

## 2020-08-17 LAB — HORMONE PANEL (T4,TSH,FSH,TESTT,SHBG,DHEA,ETC)
DHEA-Sulfate, LCMS: 77 ug/dL
Estradiol, Serum, MS: 59 pg/mL
Estrone Sulfate: 273 ng/dL
Follicle Stimulating Hormone: 7.4 m[IU]/mL
Free T-3: 3.4 pg/mL
Free Testosterone, Serum: 2.7 pg/mL
Progesterone, Serum: <10 ng/dL
Sex Hormone Binding Globulin: 17 nmol/L — ABNORMAL LOW
T4: 5.2 ug/dL
TSH: 0.55 uU/mL
Testosterone, Serum (Total): 21 ng/dL
Testosterone-% Free: 1.3 %
Triiodothyronine (T-3), Serum: 102 ng/dL

## 2020-09-16 ENCOUNTER — Other Ambulatory Visit: Payer: Self-pay | Admitting: Family Medicine

## 2020-11-07 DIAGNOSIS — D239 Other benign neoplasm of skin, unspecified: Secondary | ICD-10-CM | POA: Diagnosis not present

## 2020-11-07 DIAGNOSIS — Q828 Other specified congenital malformations of skin: Secondary | ICD-10-CM | POA: Diagnosis not present

## 2020-11-13 HISTORY — PX: HAND SURGERY: SHX662

## 2020-11-19 DIAGNOSIS — D239 Other benign neoplasm of skin, unspecified: Secondary | ICD-10-CM | POA: Diagnosis not present

## 2020-11-19 DIAGNOSIS — Q828 Other specified congenital malformations of skin: Secondary | ICD-10-CM | POA: Diagnosis not present

## 2020-12-04 DIAGNOSIS — Q828 Other specified congenital malformations of skin: Secondary | ICD-10-CM | POA: Diagnosis not present

## 2020-12-31 DIAGNOSIS — M25642 Stiffness of left hand, not elsewhere classified: Secondary | ICD-10-CM | POA: Diagnosis not present

## 2021-01-15 DIAGNOSIS — M25642 Stiffness of left hand, not elsewhere classified: Secondary | ICD-10-CM | POA: Diagnosis not present

## 2021-01-20 ENCOUNTER — Other Ambulatory Visit: Payer: Self-pay | Admitting: Family Medicine

## 2021-02-14 ENCOUNTER — Encounter: Payer: Self-pay | Admitting: Nurse Practitioner

## 2021-02-14 ENCOUNTER — Ambulatory Visit (HOSPITAL_COMMUNITY)
Admission: RE | Admit: 2021-02-14 | Discharge: 2021-02-14 | Disposition: A | Discharge status: Home / Self Care | Payer: Medicaid Other | Source: Ambulatory Visit | Attending: Nurse Practitioner | Admitting: Nurse Practitioner

## 2021-02-14 ENCOUNTER — Ambulatory Visit: Payer: Medicaid Other | Admitting: Nurse Practitioner

## 2021-02-14 ENCOUNTER — Other Ambulatory Visit: Payer: Self-pay

## 2021-02-14 VITALS — BP 137/88 | HR 88 | Temp 97.9°F | Ht 60.0 in | Wt 151.0 lb

## 2021-02-14 DIAGNOSIS — F17200 Nicotine dependence, unspecified, uncomplicated: Secondary | ICD-10-CM

## 2021-02-14 DIAGNOSIS — R1310 Dysphagia, unspecified: Secondary | ICD-10-CM

## 2021-02-14 DIAGNOSIS — J392 Other diseases of pharynx: Secondary | ICD-10-CM

## 2021-02-14 DIAGNOSIS — R3989 Other symptoms and signs involving the genitourinary system: Secondary | ICD-10-CM

## 2021-02-14 DIAGNOSIS — J3489 Other specified disorders of nose and nasal sinuses: Secondary | ICD-10-CM | POA: Diagnosis not present

## 2021-02-14 DIAGNOSIS — Z13220 Encounter for screening for lipoid disorders: Secondary | ICD-10-CM

## 2021-02-14 MED ORDER — ONDANSETRON 8 MG PO TBDP: 8.0000 mg | ORAL_TABLET | Freq: Two times a day (BID) | ORAL | 0 refills | Status: AC

## 2021-02-14 NOTE — Progress Notes (Signed)
[address] [place], [place]  [address] Phone:  [phone]   Fax:  [phone]   Acute Office Visit  Subjective:    Patient ID: Vanessa Butler, female    DOB: [birth year], 46 y.o.   MRN: [id number]  Chief Complaint  Patient presents with   Follow-up    Pt stated her throat is dry drainage when she eats the food stops in her chest pt stated it feels like her throat is closing up on her.    HPI Patient is in today for difficulty swallowing. She  has a past medical history of GERD (gastroesophageal reflux disease), Grieving, Hyperglycemia (11/2018), Insomnia (11/2018), Skin disorder, Vitamin B12 deficiency (12/2018), and Vitamin D deficiency (12/2018).    Sore Throat Patient complains of sore throat. Associated symptoms include sore throat. She feels like she is having some closet to her throat.  She had the driness all the time. She feels like drainage is coming down. She does get tingling to her throat and it makes her feel like she is choking. She denies any "sickness". Onset of symptoms was several  months up to a year.   ago, and have been gradually worsening since that time. She is drinking plenty of fluids. She has not had recent close exposure to someone with proven streptococcal pharyngitis. She is not sure if there is swelling  She is throwing up some nights. She had an episode of vomiting. She has been treated for GERD on Omperazole 20 mg BID. She is snoring a lot now.  She discontinued the melatonin thought this was related to the driness. However no change. She reports a 30 year PY. Denies fever, headache, cough, wheezing, shortness of breath, chest pains, nausea, abdominal pain Past Medical History:  Diagnosis Date   GERD (gastroesophageal reflux disease)    Grieving    Hyperglycemia 11/2018   Insomnia 11/2018   Skin disorder    Vitamin B12 deficiency 12/2018   Vitamin D deficiency 12/2018    Past Surgical History:  Procedure  Laterality Date   DILATATION & CURETTAGE/HYSTEROSCOPY WITH MYOSURE N/A 09/21/2019   Procedure: DILATATION & CURETTAGE/HYSTEROSCOPY WITH MYOSURE;  Surgeon: Sloan Leiter, MD;  Location: [facility];  Service: Gynecology;  Laterality: N/A;   FOOT SURGERY     HAND SURGERY Left    TUBAL LIGATION      Family History  Problem Relation Age of Onset   Diabetes Maternal Grandmother    Cancer Maternal Grandmother    Asthma Paternal Grandmother     Social History   Socioeconomic History   Marital status: Single    Spouse name: Not on file   Number of children: 3   Years of education: Not on file   Highest education level: 10th grade  Occupational History   Not on file  Tobacco Use   Smoking status: Every Day    Packs/day: 1.00    Types: Cigarettes   Smokeless tobacco: Never  Vaping Use   Vaping Use: Never used  Substance and Sexual Activity   Alcohol use: Yes    Comment: occ   Drug use: No   Sexual activity: Yes    Birth control/protection: Surgical  Other Topics Concern   Not on file  Social History Narrative   Not on file   Social Determinants of Health   Financial Resource Strain: Not on file  Food Insecurity: Not on file  Transportation Needs: Not on file  Physical  Activity: Not on file  Stress: Not on file  Social Connections: Not on file  Intimate Partner Violence: Not on file    Outpatient Medications Prior to Visit  Medication Sig Dispense Refill   medroxyPROGESTERone (PROVERA) 10 MG tablet TAKE 1 TABLET(10 MG) BY MOUTH DAILY 30 tablet 3   omeprazole (PRILOSEC) 20 MG capsule Take 1 capsule (20 mg total) by mouth 2 (two) times daily before a meal. 180 capsule 3   No facility-administered medications prior to visit.    Allergies  Allergen Reactions   Doxycycline Nausea Only    Review of Systems  Respiratory:         Right nare seems smaller than left      Objective:    Physical Exam  BP 137/88    Pulse 88    Temp 97.9 F (36.6 C)     Ht 5' (1.524 m)    Wt 151 lb 0.4 oz (68.5 kg)    SpO2 100%    BMI 29.50 kg/m  Wt Readings from Last 3 Encounters:  02/14/21 151 lb 0.4 oz (68.5 kg)  10/11/19 151 lb (68.5 kg)  09/21/19 149 lb 14.6 oz (68 kg)    Health Maintenance Due  Topic Date Due   PAP SMEAR-Modifier  12/22/2020    There are no preventive care reminders to display for this patient.   Lab Results  Component Value Date   TSH 1.050 12/06/2018   Lab Results  Component Value Date   WBC 5.2 04/11/2019   HGB 13.8 04/11/2019   HCT 40.8 04/11/2019   MCV 100 (H) 04/11/2019   PLT 378 04/11/2019   Lab Results  Component Value Date   NA 138 04/11/2019   K 4.1 04/11/2019   CO2 18 (L) 12/06/2018   GLUCOSE 84 04/11/2019   BUN 8 04/11/2019   CREATININE 0.82 04/11/2019   BILITOT <0.2 04/11/2019   ALKPHOS 87 04/11/2019   AST 15 04/11/2019   ALT 10 12/06/2018   PROT 7.3 04/11/2019   ALBUMIN 4.4 04/11/2019   CALCIUM 9.3 04/11/2019   ANIONGAP 7 11/19/2017   Lab Results  Component Value Date   CHOL 137 12/06/2018   Lab Results  Component Value Date   HDL 31 (L) 12/06/2018   Lab Results  Component Value Date   LDLCALC 88 12/06/2018   Lab Results  Component Value Date   TRIG 93 12/06/2018   Lab Results  Component Value Date   CHOLHDL 4.4 12/06/2018   Lab Results  Component Value Date   HGBA1C 5.7 (A) 12/06/2018       Assessment & Plan:   Problem List Items Addressed This Visit   None Visit Diagnoses     Dysphagia, unspecified type    -  Primary   Relevant Orders   Ambulatory referral to ENT   Ambulatory referral to Gastroenterology   Comp. Metabolic Panel (12)   TSH   DG Chest 2 View   DG Neck Soft Tissue   Dry throat       Relevant Orders   Ambulatory referral to ENT   TSH   DG Chest 2 View   Nocturnal emission       Relevant Orders   Ambulatory referral to Gastroenterology   Sinus drainage       Smoker       Relevant Orders   CBC with Differential/Platelet   DG Chest 2 View    Screening for cholesterol level  Relevant Orders   Lipid panel        No orders of the defined types were placed in this encounter.    Vevelyn Francois, NP

## 2021-02-14 NOTE — Patient Instructions (Addendum)
These symptoms may repHiatal Hernia A hiatal hernia occurs when part of the stomach slides above the muscle that separates the abdomen from the chest (diaphragm). A person can be born with a hiatal hernia (congenital), or it may develop over time. In almost all cases of hiatal hernia, only the top part of the stomach pushes through the diaphragm. Many people have a hiatal hernia with no symptoms. The larger the hernia, the more likely it is that you will have symptoms. In some cases, a hiatal hernia allows stomach acid to flow back into the tube that carries food from your mouth to your stomach (esophagus). This may cause heartburn symptoms. Severe heartburn symptoms may mean that you have developed a condition called gastroesophageal reflux disease (GERD). What are the causes? This condition is caused by a weakness in the opening (hiatus) where the esophagus passes through the diaphragm to attach to the upper part of the stomach. A person may be born with a weakness in the hiatus, or a weakness can develop over time. What increases the risk? This condition is more likely to develop in: Older people. Age is a major risk factor for a hiatal hernia, especially if you are over the age of 16. Pregnant women. People who are overweight. People who have frequent constipation. What are the signs or symptoms? Symptoms of this condition usually develop in the form of GERD symptoms. Symptoms include: Heartburn. Belching. Indigestion. Trouble swallowing. Coughing or wheezing. Sore throat. Hoarseness. Chest pain. Nausea and vomiting. How is this diagnosed? This condition may be diagnosed during testing for GERD. Tests that may be done include: X-rays of your stomach or chest. An upper gastrointestinal (GI) series. This is an X-ray exam of your GI tract that is taken after you swallow a chalky liquid that shows up clearly on the X-ray. Endoscopy. This is a procedure to look into your stomach using a  thin, flexible tube that has a tiny camera and light on the end of it. How is this treated? This condition may be treated by: Dietary and lifestyle changes to help reduce GERD symptoms. Medicines. These may include: Over-the-counter antacids. Medicines that make your stomach empty more quickly. Medicines that block the production of stomach acid (H2 blockers). Stronger medicines to reduce stomach acid (proton pump inhibitors). Surgery to repair the hernia, if other treatments are not helping. If you have no symptoms, you may not need treatment. Follow these instructions at home: Lifestyle and activity Do not use any products that contain nicotine or tobacco, such as cigarettes and e-cigarettes. If you need help quitting, ask your health care provider. Try to achieve and maintain a healthy body weight. Avoid putting pressure on your abdomen. Anything that puts pressure on your abdomen increases the amount of acid that may be pushed up into your esophagus. Avoid bending over, especially after eating. Raise the head of your bed by putting blocks under the legs. This keeps your head and esophagus higher than your stomach. Do not wear tight clothing around your chest or stomach. Try not to strain when having a bowel movement, when urinating, or when lifting heavy objects. Eating and drinking Avoid foods that can worsen GERD symptoms. These may include: Fatty foods, like fried foods. Citrus fruits, like oranges or lemon. Other foods and drinks that contain acid, like orange juice or tomatoes. Spicy food. Chocolate. Eat frequent small meals instead of three large meals a day. This helps prevent your stomach from getting too full. Eat slowly. Do not lie  down right after eating. Do not eat 1-2 hours before bed. Do not drink beverages with caffeine. These include cola, coffee, cocoa, and tea. Do not drink alcohol. General instructions Take over-the-counter and prescription medicines only as  told by your health care provider. Keep all follow-up visits as told by your health care provider. This is important. Contact a health care provider if: Your symptoms are not controlled with medicines or lifestyle changes. You are having trouble swallowing. You have coughing or wheezing that will not go away. Get help right away if: Your pain is getting worse. Your pain spreads to your arms, neck, jaw, teeth, or back. You have shortness of breath. You sweat for no reason. You feel sick to your stomach (nauseous) or you vomit. You vomit blood. You have bright red blood in your stools. You have black, tarry stools. Summary A hiatal hernia occurs when part of the stomach slides above the muscle that separates the abdomen from the chest (diaphragm). A person may be born with a weakness in the hiatus, or a weakness can develop over time. Symptoms of hiatal hernia may include heartburn, trouble swallowing, or sore throat. Management of hiatal hernia includes eating frequent small meals instead of three large meals a day. Get help right away if you vomit blood, have bright red blood in your stools, or have black, tarry stools. This information is not intended to replace advice given to you by your health care provider. Make sure you discuss any questions you have with your health care provider. Document Revised: 12/01/2019 Document Reviewed: 12/01/2019 Elsevier Patient Education  2022 [employer]. resent a serious problem that is an emergency. Do not wait to see if the symptoms will go away. Get medical help right away. Call your local emergency services (911 in the [place] Do not drive yourself to the hospital. Summary Esophageal dilatation, also called esophageal dilation, is a procedure to widen or open a blocked or narrowed part of the esophagus. Plan to have a responsible adult take you home from the hospital or clinic. For this procedure, a numbing medicine may be sprayed into the back of  your throat, or you may gargle the medicine. Do not drive or operate machinery until your health care provider says that it is safe. This information is not intended to replace advice given to you by your health care provider. Make sure you discuss any questions you have with your health care provider. Document Revised: 05/18/2019 Document Reviewed: 05/18/2019 Elsevier Patient Education  [place] scan is a kind of X-ray. A CT scan makes pictures of the inside of your body. The pictures will be taken in a large machine that has an opening (CT scanner). What happens before the procedure? Staying hydrated Follow instructions from your doctor about hydration, which may include: Up to 2 hours before the procedure - you may continue to drink clear liquids. These include water, clear fruit juice, black coffee, and plain tea. Eating and drinking restrictions Follow instructions from your doctor about eating and drinking, which may include: 24 hours before the procedure - stop drinking caffeinated drinks. These include energy drinks, tea, soda, coffee, and hot chocolate. 8 hours before the procedure - stop eating heavy meals or foods. These include meat, fried foods, or fatty foods. 6 hours before the procedure - stop eating light meals or foods. These include toast or cereal. 6 hours before the procedure - stop drinking milk or drinks that have milk in  them. 2 hours before the procedure - stop drinking clear liquids. General instructions Take off any jewelry. Ask your doctor about changing or stopping your normal medicines. This is important if you take diabetes medicines or blood thinners. What happens during the procedure? An IV tube may be put into one of your veins. Dye may be put into the IV tube. You may feel warm or have a metal taste in your mouth. You will lie on a table with your arms above your head. The table you will be lying on will move into the CT  scanner. You will be able to see, hear, and talk to the person who is running the machine while you are in it. Follow that person's directions. The machine will move around you to take pictures. Do not move. When the machine is done taking pictures, it will be turned off. The table will be moved out of the machine. Your IV tube will be taken out. The procedure may vary among doctors and hospitals. What happens after the procedure? It is up to you to get your results. Ask when your results will be ready. Summary A CT scan is a kind of X-ray. A CT scan makes pictures of the inside of your body. Follow instructions from your doctor about eating and drinking before the procedure. You will be able to see, hear, and talk to the person who is running the machine while you are in it. Follow that person's directions. This information is not intended to replace advice given to you by your health care provider. Make sure you discuss any questions you have with your health care provider. Document Revised: 09/12/2020 Document Reviewed: 12/01/2019 Elsevier Patient Education  2022 [employer].

## 2021-02-15 LAB — CBC WITH DIFFERENTIAL/PLATELET
Basophils Absolute: 0 10*3/uL (ref 0.0–0.2)
Basos: 1 %
EOS (ABSOLUTE): 0 10*3/uL (ref 0.0–0.4)
Eos: 1 %
Hematocrit: 42.2 % (ref 34.0–46.6)
Hemoglobin: 14.5 g/dL (ref 11.1–15.9)
Immature Grans (Abs): 0 10*3/uL (ref 0.0–0.1)
Immature Granulocytes: 0 %
Lymphocytes Absolute: 2.6 10*3/uL (ref 0.7–3.1)
Lymphs: 47 %
MCH: 34.4 pg — ABNORMAL HIGH (ref 26.6–33.0)
MCHC: 34.4 g/dL (ref 31.5–35.7)
MCV: 100 fL — ABNORMAL HIGH (ref 79–97)
Monocytes Absolute: 0.4 10*3/uL (ref 0.1–0.9)
Monocytes: 7 %
Neutrophils Absolute: 2.4 10*3/uL (ref 1.4–7.0)
Neutrophils: 44 %
Platelets: 349 10*3/uL (ref 150–450)
RBC: 4.22 x10E6/uL (ref 3.77–5.28)
RDW: 12 % (ref 11.7–15.4)
WBC: 5.4 10*3/uL (ref 3.4–10.8)

## 2021-02-15 LAB — LIPID PANEL
Chol/HDL Ratio: 5.4 ratio — ABNORMAL HIGH (ref 0.0–4.4)
Cholesterol, Total: 162 mg/dL (ref 100–199)
HDL: 30 mg/dL — ABNORMAL LOW (ref >39)
LDL Chol Calc (NIH): 99 mg/dL (ref 0–99)
Triglycerides: 190 mg/dL — ABNORMAL HIGH (ref 0–149)
VLDL Cholesterol Cal: 33 mg/dL (ref 5–40)

## 2021-02-15 LAB — COMP. METABOLIC PANEL (12)
AST: 12 IU/L (ref 0–40)
Albumin/Globulin Ratio: 1.6 (ref 1.2–2.2)
Albumin: 4.4 g/dL (ref 3.8–4.8)
Alkaline Phosphatase: 68 IU/L (ref 44–121)
BUN/Creatinine Ratio: 16 (ref 9–23)
BUN: 12 mg/dL (ref 6–24)
Bilirubin Total: <0.2 mg/dL (ref 0.0–1.2)
Calcium: 9.4 mg/dL (ref 8.7–10.2)
Chloride: 106 mmol/L (ref 96–106)
Creatinine, Ser: 0.76 mg/dL (ref 0.57–1.00)
Globulin, Total: 2.7 g/dL (ref 1.5–4.5)
Glucose: 106 mg/dL — ABNORMAL HIGH (ref 70–99)
Potassium: 4.6 mmol/L (ref 3.5–5.2)
Sodium: 140 mmol/L (ref 134–144)
Total Protein: 7.1 g/dL (ref 6.0–8.5)
eGFR: 98 mL/min/{1.73_m2} (ref >59)

## 2021-02-15 LAB — TSH: TSH: 1.03 u[IU]/mL (ref 0.450–4.500)

## 2021-02-21 ENCOUNTER — Encounter: Payer: Self-pay | Admitting: Gastroenterology

## 2021-03-13 ENCOUNTER — Ambulatory Visit: Payer: Medicaid Other | Admitting: Gastroenterology

## 2021-03-13 ENCOUNTER — Encounter: Payer: Self-pay | Admitting: Gastroenterology

## 2021-03-13 VITALS — BP 120/82 | HR 86 | Ht 60.0 in | Wt 150.0 lb

## 2021-03-13 DIAGNOSIS — R1319 Other dysphagia: Secondary | ICD-10-CM | POA: Diagnosis not present

## 2021-03-13 DIAGNOSIS — Z1211 Encounter for screening for malignant neoplasm of colon: Secondary | ICD-10-CM

## 2021-03-13 MED ORDER — OMEPRAZOLE 40 MG PO CPDR: 40.0000 mg | DELAYED_RELEASE_CAPSULE | Freq: Two times a day (BID) | ORAL | 11 refills | Status: DC

## 2021-03-13 NOTE — Progress Notes (Signed)
? ?Referring Provider: Vevelyn Francois, NP ?Primary Care Physician:  Vevelyn Francois, NP ? ?Reason for Consultation:  GERD ? ? ?IMPRESSION:  ?Dysphagia - ? Oropharyngeal +/- esophageal dysphagia ?Need for colon cancer screening ? ?PLAN: ?Increase omeprazole to 40 mg BID ?EGD with esophageal and gastric biopsies ?Referral to ENT ?Colonoscopy for colon cancer screening with 2 day bowel prep ? ? ?HPI: Vanessa Butler is a 46 y.o. female referred by NP Edison Pace for further evaluation of dysphagia. She has a history of hyperglycemia, insomnia, B12 deficiency, cervical polyps, and vitamin D deficiency.  ? ?Referred for several years of feeling like her throat is dry and "closing up on her." Sensation that drainage is coming down the back of her nose and throat but she feels like she doesn't have enough saliva. She feels like she must take sips of water to clear the back of her throat to breath.  ?Also experiences nocturnal vomiting and smothering. ?Intermittent solid food dysphagia localized to the sternal notch.  ?Deep breaths and sips of water between bites provide relief.  ?No odynophagia, dysphonia, change in bowel habits, blood in the stool.    ?She wonders if melatonin causes a dry mouth.  ?Had unremarkable chest and neck x-rays 02/14/21. ?TSH normal.  ?She was referred to ENT and GI for further evaluation but does not yet have an ENT appointment.  ?Uses omeprazole 20 mg BID with some improvement but not complete relief.  ?She is also bothered about an increase in abdominal girth and difficulty losing weight.  ?She has intermittent constipation with hard stools and straining.  ? ?No prior colonoscopy or colon cancer screening. ? ?There is no known family history of colon cancer or polyps. No family history of stomach cancer or other GI malignancy. No family history of inflammatory bowel disease or celiac.  ? ? ?Past Medical History:  ?Diagnosis Date  ? GERD (gastroesophageal reflux disease)   ? Grieving   ?  Hyperglycemia 11/2018  ? Insomnia 11/2018  ? Skin disorder   ? Vitamin B12 deficiency 12/2018  ? Vitamin D deficiency 12/2018  ? ? ?Past Surgical History:  ?Procedure Laterality Date  ? DILATATION & CURETTAGE/HYSTEROSCOPY WITH MYOSURE N/A 09/21/2019  ? Procedure: DILATATION & CURETTAGE/HYSTEROSCOPY WITH MYOSURE;  Surgeon: Sloan Leiter, MD;  Location: [facility];  Service: Gynecology;  Laterality: N/A;  ? FOOT SURGERY    ? HAND SURGERY Left   ? TUBAL LIGATION    ? ? ? ?Current Outpatient Medications  ?Medication Sig Dispense Refill  ? medroxyPROGESTERone (PROVERA) 10 MG tablet TAKE 1 TABLET(10 MG) BY MOUTH DAILY 30 tablet 3  ? omeprazole (PRILOSEC) 20 MG capsule Take 1 capsule (20 mg total) by mouth 2 (two) times daily before a meal. 180 capsule 3  ? ?No current facility-administered medications for this visit.  ? ? ?Allergies as of 03/13/2021 - Review Complete 03/13/2021  ?Allergen Reaction Noted  ? Doxycycline Nausea Only 09/01/2014  ? ? ?Family History  ?Problem Relation Age of Onset  ? Diabetes Maternal Grandmother   ? Cancer Maternal Grandmother   ? Asthma Paternal Grandmother   ? Colon cancer Neg Hx   ? Pancreatic cancer Neg Hx   ? Stomach cancer Neg Hx   ? Liver cancer Neg Hx   ? Esophageal cancer Neg Hx   ? ? ?Social History  ? ?Socioeconomic History  ? Marital status: Significant Other  ?  Spouse name: Not on file  ? Number of children: 3  ?  Years of education: Not on file  ? Highest education level: 10th grade  ?Occupational History  ? Not on file  ?Tobacco Use  ? Smoking status: Every Day  ?  Packs/day: 1.00  ?  Years: 31.00  ?  Pack years: 31.00  ?  Types: Cigarettes  ? Smokeless tobacco: Never  ?Vaping Use  ? Vaping Use: Never used  ?Substance and Sexual Activity  ? Alcohol use: Yes  ?  Comment: occ  ? Drug use: No  ? Sexual activity: Yes  ?  Birth control/protection: Surgical  ?Other Topics Concern  ? Not on file  ?Social History Narrative  ? Not on file  ? ?Social Determinants of Health   ? ?Financial Resource Strain: Not on file  ?Food Insecurity: Not on file  ?Transportation Needs: Not on file  ?Physical Activity: Not on file  ?Stress: Not on file  ?Social Connections: Not on file  ?Intimate Partner Violence: Not on file  ? ? ?Review of Systems: ?12 system ROS is negative except as noted above.  ? ?Physical Exam: ?General:   Alert,  well-nourished, pleasant and cooperative in NAD ?Head:  Normocephalic and atraumatic. ?Eyes:  Sclera clear, no icterus.   Conjunctiva pink. ?Ears:  Normal auditory acuity. ?Nose:  No deformity, discharge,  or lesions. ?Mouth:  No deformity or lesions.   ?Neck:  Supple; no masses or thyromegaly. ?Lungs:  Clear throughout to auscultation.   No wheezes. ?Heart:  Regular rate and rhythm; no murmurs. ?Abdomen:  Soft, nontender, nondistended, normal bowel sounds, no rebound or guarding. No hepatosplenomegaly.   ?Rectal:  Deferred  ?Msk:  Symmetrical. No boney deformities ?LAD: No inguinal or umbilical LAD ?Extremities:  No clubbing or edema. ?Neurologic:  Alert and  oriented x4;  grossly nonfocal ?Skin:  Intact without significant lesions or rashes. ?Psych:  Alert and cooperative. Normal mood and affect. ? ? ? ?Rowan Blaker L. Tarri Glenn, MD, MPH ?03/13/2021, 10:00 AM ? ? ? ?  ?

## 2021-03-13 NOTE — Patient Instructions (Addendum)
it was my pleasure to provide care to you today. Based on our discussion, I am providing you with my recommendations below: ? ?RECOMMENDATION(S):  ? ?We discussed an upper endoscopy to evaluate your symptoms. However, I also recommend that you see ENT for further evaluation.  ? ?I recommend increasing your omeprazole to 40 mg twice daily in the meantime. ? ?It is also time for a screening colonoscopy.  ? ?PRESCRIPTION MEDICATION(S):  ? ?We have sent the following medication(s) to your pharmacy: ? ?Omeprazole 40 mg twice daily 30-60 minutes before breakfast and dinner. ? ?REFERRAL: ? ?A referral, your demographics, a copy of your insurance card and your records will be sent to [facility]. You will receive a call from their office regarding the date, time and location of your appointment. ? ?UPPER ENDOSCOPY/COLONOSCOPY:  ? ?You have been scheduled for a upper endoscopy/colonoscopy. Please follow written instructions given to you at your visit today.  ? ?PREP:  ? ?Please pick up your prep supplies at the pharmacy within the next 1-3 days. ? ?COLONOSCOPY TIPS: ? ?To reduce nausea and dehydration, stay well hydrated for 3-4 days prior to the exam.  ?To prevent skin/hemorrhoid irritation - prior to wiping, put A&Dointment or vaseline on the toilet paper. ?Keep a towel or pad on the bed.  ?BEFORE STARTING YOUR PREP, drink  64oz of clear liquids in the morning. This will help to flush the colon and will ensure you are well hydrated!!!!  ?NOTE - This is in addition to the fluids required for to complete your prep. ?Use of a flavored hard candy, such as grape Anise Salvo, can counteract some of the flavor of the prep and may prevent some nausea.  ? ? ?FOLLOW UP: ? ?After your procedure, you will receive a call from my office staff regarding my recommendation for follow up. ? ?BMI: ? ?If you are age 43 or younger, your body mass index should be between 19-25. Your Body mass index is 29.29 kg/m?Marland Kitchen If this is  out of the aformentioned range listed, please consider follow up with your Primary Care Provider.  ? ?MY CHART: ? ?The [place] [facility] providers would like to encourage you to use [facility] to communicate with providers for non-urgent requests or questions.  Due to long hold times on the telephone, sending your provider a message by [facility] may be a faster and more efficient way to get a response.  Please allow 48 business hours for a response.  Please remember that this is for non-urgent requests.  ? ?Thank you for trusting me with your gastrointestinal care!   ? ?Thornton Park, MD, MPH ? ?

## 2021-05-10 ENCOUNTER — Ambulatory Visit (AMBULATORY_SURGERY_CENTER): Payer: Medicaid Other | Admitting: Gastroenterology

## 2021-05-10 ENCOUNTER — Encounter: Payer: Self-pay | Admitting: Gastroenterology

## 2021-05-10 VITALS — BP 132/67 | HR 76 | Temp 98.1°F | Resp 24 | Ht 60.0 in | Wt 150.0 lb

## 2021-05-10 DIAGNOSIS — K2970 Gastritis, unspecified, without bleeding: Secondary | ICD-10-CM | POA: Diagnosis not present

## 2021-05-10 DIAGNOSIS — R1319 Other dysphagia: Secondary | ICD-10-CM

## 2021-05-10 DIAGNOSIS — R1310 Dysphagia, unspecified: Secondary | ICD-10-CM | POA: Diagnosis not present

## 2021-05-10 DIAGNOSIS — K219 Gastro-esophageal reflux disease without esophagitis: Secondary | ICD-10-CM | POA: Diagnosis not present

## 2021-05-10 DIAGNOSIS — K2950 Unspecified chronic gastritis without bleeding: Secondary | ICD-10-CM | POA: Diagnosis not present

## 2021-05-10 DIAGNOSIS — D3A026 Benign carcinoid tumor of the rectum: Secondary | ICD-10-CM | POA: Diagnosis not present

## 2021-05-10 DIAGNOSIS — D3A8 Other benign neuroendocrine tumors: Secondary | ICD-10-CM | POA: Diagnosis not present

## 2021-05-10 DIAGNOSIS — Z1211 Encounter for screening for malignant neoplasm of colon: Secondary | ICD-10-CM

## 2021-05-10 DIAGNOSIS — D123 Benign neoplasm of transverse colon: Secondary | ICD-10-CM

## 2021-05-10 HISTORY — PX: COLONOSCOPY: SHX174

## 2021-05-10 HISTORY — PX: UPPER GASTROINTESTINAL ENDOSCOPY: SHX188

## 2021-05-10 MED ORDER — SODIUM CHLORIDE 0.9 % IV SOLN: 500.0000 mL | Freq: Once | INTRAVENOUS | Status: DC

## 2021-05-10 NOTE — Progress Notes (Signed)
Pt's states no medical or surgical changes since previsit or office visit. 

## 2021-05-10 NOTE — Patient Instructions (Signed)
No aspirin, aleve or ibuprofen until the biopsies come back.  You may resume your previous diet.  Continue to take your stomach medicine. Try to eat more fiber, and drink plenty of water. ? ?YOU HAD AN ENDOSCOPIC PROCEDURE TODAY AT [facility]:   Refer to the procedure report that was given to you for any specific questions about what was found during the examination.  If the procedure report does not answer your questions, please call your gastroenterologist to clarify.  If you requested that your care partner not be given the details of your procedure findings, then the procedure report has been included in a sealed envelope for you to review at your convenience later. ? ?YOU SHOULD EXPECT: Some feelings of bloating in the abdomen. Passage of more gas than usual.  Walking can help get rid of the air that was put into your GI tract during the procedure and reduce the bloating. If you had a lower endoscopy (such as a colonoscopy or flexible sigmoidoscopy) you may notice spotting of blood in your stool or on the toilet paper. If you underwent a bowel prep for your procedure, you may not have a normal bowel movement for a few days. ? ?Please Note:  You might notice some irritation and congestion in your nose or some drainage.  This is from the oxygen used during your procedure.  There is no need for concern and it should clear up in a day or so. ? ?SYMPTOMS TO REPORT IMMEDIATELY: ? ?Following lower endoscopy (colonoscopy or flexible sigmoidoscopy): ? Excessive amounts of blood in the stool ? Significant tenderness or worsening of abdominal pains ? Swelling of the abdomen that is new, acute ? Fever of 100?F or higher ? ?Following upper endoscopy (EGD) ? Vomiting of blood or coffee ground material ? New chest pain or pain under the shoulder blades ? Painful or persistently difficult swallowing ? New shortness of breath ? Fever of 100?F or higher ? Black, tarry-looking stools ? ?For urgent or emergent  issues, a gastroenterologist can be reached at any hour by calling [phone]. ?Do not use MyChart messaging for urgent concerns.  ? ? ?DIET:  We do recommend a small meal at first, but then you may proceed to your regular diet.  Drink plenty of fluids but you should avoid alcoholic beverages for 24 hours. ? ?ACTIVITY:  You should plan to take it easy for the rest of today and you should NOT DRIVE or use heavy machinery until tomorrow (because of the sedation medicines used during the test).   ? ?FOLLOW UP: ?Our staff will call the number listed on your records 48-72 hours following your procedure to check on you and address any questions or concerns that you may have regarding the information given to you following your procedure. If we do not reach you, we will leave a message.  We will attempt to reach you two times.  During this call, we will ask if you have developed any symptoms of COVID 19. If you develop any symptoms (ie: fever, flu-like symptoms, shortness of breath, cough etc.) before then, please call [phone].  If you test positive for Covid 19 in the 2 weeks post procedure, please call and report this information to [place].   ? ?If any biopsies were taken you will be contacted by phone or by letter within the next 1-3 weeks.  Please call us at [phone] if you have not heard about the biopsies in 3 weeks.  ? ? ?  SIGNATURES/CONFIDENTIALITY: ?You and/or your care partner have signed paperwork which will be entered into your electronic medical record.  These signatures attest to the fact that that the information above on your After Visit Summary has been reviewed and is understood.  Full responsibility of the confidentiality of this discharge information lies with you and/or your care-partner.  ?

## 2021-05-10 NOTE — Progress Notes (Signed)
A and O x3. Report to RN. Tolerated MAC anesthesia well.Teeth unchanged after procedure. 

## 2021-05-10 NOTE — Progress Notes (Signed)
? ?Referring Provider: Vevelyn Francois, NP ?Primary Care Physician:  Vevelyn Francois, NP ? ?Indication for EGD:  Dysphagia ?Indication for Colonoscopy:  Colon cancer screening ? ? ?IMPRESSION:  ?Dysphagia - ? Oropharyngeal +/- esophageal dysphagia ?Need for colon cancer screening ?Appropriate candidate for monitored anesthesia care ? ?PLAN: ?EGD and Colonoscopy in the [facility] today ? ? ?HPI: Vanessa Butler is a 46 y.o. female presents for endoscopic evaluation of dysphagia and screening colonoscopy. ? ?Referred for several years of feeling like her throat is dry and "closing up on her." Sensation that drainage is coming down the back of her nose and throat but she feels like she doesn't have enough saliva. She feels like she must take sips of water to clear the back of her throat to breath.  ?Also experiences nocturnal vomiting and smothering. ?Intermittent solid food dysphagia localized to the sternal notch.  ?Deep breaths and sips of water between bites provide relief.  ?No odynophagia, dysphonia, change in bowel habits, blood in the stool.    ?She wonders if melatonin causes a dry mouth.  ?Had unremarkable chest and neck x-rays 02/14/21. ?TSH normal.  ?She was referred to ENT and GI for further evaluation but does not yet have an ENT appointment.  ?Uses omeprazole 20 mg BID with some improvement but not complete relief.  ?She is also bothered about an increase in abdominal girth and difficulty losing weight.  ?She has intermittent constipation with hard stools and straining.  ?  ?No prior colonoscopy or colon cancer screening. ?  ?There is no known family history of colon cancer or polyps. No family history of stomach cancer or other GI malignancy. No family history of inflammatory bowel disease or celiac.  ? ? ?Past Medical History:  ?Diagnosis Date  ? GERD (gastroesophageal reflux disease)   ? Grieving   ? Hyperglycemia 11/2018  ? Insomnia 11/2018  ? Skin disorder   ? Vitamin B12 deficiency 12/2018  ? Vitamin D  deficiency 12/2018  ? ? ?Past Surgical History:  ?Procedure Laterality Date  ? DILATATION & CURETTAGE/HYSTEROSCOPY WITH MYOSURE N/A 09/21/2019  ? Procedure: DILATATION & CURETTAGE/HYSTEROSCOPY WITH MYOSURE;  Surgeon: Sloan Leiter, MD;  Location: [facility];  Service: Gynecology;  Laterality: N/A;  ? FOOT SURGERY    ? HAND SURGERY Left   ? TUBAL LIGATION    ? ? ?Current Outpatient Medications  ?Medication Sig Dispense Refill  ? medroxyPROGESTERone (PROVERA) 10 MG tablet TAKE 1 TABLET(10 MG) BY MOUTH DAILY 30 tablet 3  ? omeprazole (PRILOSEC) 40 MG capsule Take 1 capsule (40 mg total) by mouth in the morning and at bedtime. 60 capsule 11  ? ?No current facility-administered medications for this visit.  ? ? ?Allergies as of 05/10/2021 - Review Complete 05/10/2021  ?Allergen Reaction Noted  ? Doxycycline Nausea Only 09/01/2014  ? ? ?Family History  ?Problem Relation Age of Onset  ? Diabetes Maternal Grandmother   ? Cancer Maternal Grandmother   ? Asthma Paternal Grandmother   ? Colon cancer Neg Hx   ? Pancreatic cancer Neg Hx   ? Stomach cancer Neg Hx   ? Liver cancer Neg Hx   ? Esophageal cancer Neg Hx   ? ? ? ?Physical Exam: ?General:   Alert,  well-nourished, pleasant and cooperative in NAD ?Head:  Normocephalic and atraumatic. ?Eyes:  Sclera clear, no icterus.   Conjunctiva pink. ?Mouth:  No deformity or lesions.   ?Neck:  Supple; no masses or thyromegaly. ?Lungs:  Clear  throughout to auscultation.   No wheezes. ?Heart:  Regular rate and rhythm; no murmurs. ?Abdomen:  Soft, non-tender, nondistended, normal bowel sounds, no rebound or guarding.  ?Msk:  Symmetrical. No boney deformities ?LAD: No inguinal or umbilical LAD ?Extremities:  No clubbing or edema. ?Neurologic:  Alert and  oriented x4;  grossly nonfocal ?Skin:  No obvious rash or bruise. ?Psych:  Alert and cooperative. Normal mood and affect. ? ? ? ? ?Studies/Results: ?No results found. ? ? ? ?Javarus Dorner L. Tarri Glenn, MD, MPH ?05/10/2021, 2:03  PM ? ? ? ?  ?

## 2021-05-10 NOTE — Op Note (Signed)
[facility] ?Patient Name: Vanessa Butler ?Procedure Date: 05/10/2021 2:16 PM ?MRN: [id number] ?Endoscopist: Thornton Park MD, MD ?Age: 46 ?Referring MD:  ?Date of Birth: 06/06/1975 ?Gender: Female ?Account #: [id number] ?Procedure:                Colonoscopy ?Indications:              Screening for colorectal malignant neoplasm, This  ?                          is the patient's first colonoscopy ?                          No known family history of colon cancer or polyps ?Medicines:                Monitored Anesthesia Care ?Procedure:                Pre-Anesthesia Assessment: ?                          - Prior to the procedure, a History and Physical  ?                          was performed, and patient medications and  ?                          allergies were reviewed. The patient's tolerance of  ?                          previous anesthesia was also reviewed. The risks  ?                          and benefits of the procedure and the sedation  ?                          options and risks were discussed with the patient.  ?                          All questions were answered, and informed consent  ?                          was obtained. Prior Anticoagulants: The patient has  ?                          taken no previous anticoagulant or antiplatelet  ?                          agents. ASA Grade Assessment: II - A patient with  ?                          mild systemic disease. After reviewing the risks  ?                          and benefits, the patient was deemed in  ?  satisfactory condition to undergo the procedure. ?                          After obtaining informed consent, the colonoscope  ?                          was passed under direct vision. Throughout the  ?                          procedure, the patient's blood pressure, pulse, and  ?                          oxygen saturations were monitored continuously. The  ?                          CF [id number] #[id number] was  introduced through the anus  ?                          and advanced to the 4 cm into the ileum. A second  ?                          forward view of the right colon was performed. The  ?                          colonoscopy was performed without difficulty. The  ?                          patient tolerated the procedure well. The quality  ?                          of the bowel preparation was excellent. The  ?                          terminal ileum, ileocecal valve, appendiceal  ?                          orifice, and rectum were photographed. ?Scope In: 2:44:51 PM ?Scope Out: 3:06:16 PM ?Scope Withdrawal Time: 0 hours 17 minutes 31 seconds  ?Total Procedure Duration: 0 hours 21 minutes 25 seconds  ?Findings:                 The perianal and digital rectal examinations were  ?                          normal. ?                          One 15 mm submucosal nodule was found in the distal  ?                          rectum. I was unable to remove the nodule with a  ?                          snare. Tunneled biopsies were taken with  a cold  ?                          forceps for histology. Estimated blood loss was  ?                          minimal. ?                          A 3 mm polyp was found in the transverse colon. The  ?                          polyp was sessile. The polyp was removed with a  ?                          cold snare. Resection and retrieval were complete.  ?                          Estimated blood loss was minimal. ?                          The exam was otherwise without abnormality on  ?                          direct and retroflexion views. ?Complications:            No immediate complications. ?Estimated Blood Loss:     Estimated blood loss was minimal. ?Impression:               - Submucosal nodule in the distal rectum. Biopsied. ?                          - One 3 mm polyp in the transverse colon, removed  ?                          with a cold snare. Resected and retrieved. ?                           - The examination was otherwise normal on direct  ?                          and retroflexion views. ?Recommendation:           - Patient has a contact number available for  ?                          emergencies. The signs and symptoms of potential  ?                          delayed complications were discussed with the  ?                          patient. Return to normal activities tomorrow.  ?                          Written discharge  instructions were provided to the  ?                          patient. ?                          - Resume previous diet. ?                          - Continue present medications. ?                          - Await pathology results. ?                          - Repeat colonoscopy date to be determined after  ?                          pending pathology results are reviewed for  ?                          surveillance. ?                          - Emerging evidence supports eating a diet of  ?                          fruits, vegetables, grains, calcium, and yogurt  ?                          while reducing red meat and alcohol may reduce the  ?                          risk of colon cancer. ?Thornton Park MD, MD ?05/10/2021 3:19:36 PM ?This report has been signed electronically. ?

## 2021-05-10 NOTE — Op Note (Signed)
[facility] ?Patient Name: Vanessa Butler ?Procedure Date: 05/10/2021 2:17 PM ?MRN: [id number] ?Endoscopist: Thornton Park MD, MD ?Age: 46 ?Referring MD:  ?Date of Birth: 10/18/1975 ?Gender: Female ?Account #: [id number] ?Procedure:                Upper GI endoscopy ?Indications:              Dysphagia ?Medicines:                Monitored Anesthesia Care ?Procedure:                Pre-Anesthesia Assessment: ?                          - Prior to the procedure, a History and Physical  ?                          was performed, and patient medications and  ?                          allergies were reviewed. The patient's tolerance of  ?                          previous anesthesia was also reviewed. The risks  ?                          and benefits of the procedure and the sedation  ?                          options and risks were discussed with the patient.  ?                          All questions were answered, and informed consent  ?                          was obtained. Prior Anticoagulants: The patient has  ?                          taken no previous anticoagulant or antiplatelet  ?                          agents. ASA Grade Assessment: II - A patient with  ?                          mild systemic disease. After reviewing the risks  ?                          and benefits, the patient was deemed in  ?                          satisfactory condition to undergo the procedure. ?                          After obtaining informed consent, the endoscope was  ?  passed under direct vision. Throughout the  ?                          procedure, the patient's blood pressure, pulse, and  ?                          oxygen saturations were monitored continuously. The  ?                          Endoscope was introduced through the mouth, and  ?                          advanced to the third part of duodenum. The upper  ?                          GI endoscopy was accomplished without  difficulty.  ?                          The patient tolerated the procedure well. ?Scope In: ?Scope Out: ?Findings:                 No endoscopic abnormality was evident in the  ?                          esophagus to explain the patient's complaint of  ?                          dysphagia. It was decided, however, to proceed with  ?                          dilation of the distal esophagus. A TTS dilator was  ?                          passed through the scope. Dilation with a 16-17-18  ?                          mm balloon dilator was performed to 18 mm. A fully  ?                          inflated balloon was pulled through the esophagus  ?                          with no resistance. The dilation site was examined  ?                          and showed no change. Biopsies were obtained from  ?                          the mid/proximal and distal esophagus with cold  ?                          forceps for histology of suspected eosinophilic  ?  esophagitis. ?                          Patchy mildly erythematous mucosa without bleeding  ?                          was found in the gastric body. Biopsies were taken  ?                          from the antrum, body, and fundus with a cold  ?                          forceps for histology. Estimated blood loss was  ?                          minimal. ?                          The examined duodenum was normal. ?                          The exam was otherwise without abnormality. ?Complications:            No immediate complications. ?Estimated Blood Loss:     Estimated blood loss was minimal. ?Impression:               - No endoscopic esophageal abnormality to explain  ?                          patient's dysphagia. Esophagus dilated. Biopsied. ?                          - Erythematous mucosa in the gastric body. Biopsied. ?                          - Normal examined duodenum. ?                          - The examination was otherwise  normal. ?Recommendation:           - Patient has a contact number available for  ?                          emergencies. The signs and symptoms of potential  ?                          delayed complications were discussed with the  ?                          patient. Return to normal activities tomorrow.  ?                          Written discharge instructions were provided to the  ?                          patient. ?                          -  Resume previous diet. ?                          - Continue present medications including omeprazole  ?                          40 mg BID. ?                          - Await pathology results. ?                          - No aspirin, ibuprofen, naproxen, or other  ?                          non-steroidal anti-inflammatory drugs. ?                          - Evaluate for oropharyngeal dysphagia if symptoms  ?                          persist and biopsies are normal. ?Thornton Park MD, MD ?05/10/2021 3:15:00 PM ?This report has been signed electronically. ?

## 2021-05-13 ENCOUNTER — Other Ambulatory Visit: Payer: Self-pay

## 2021-05-14 ENCOUNTER — Telehealth: Payer: Self-pay

## 2021-05-14 NOTE — Telephone Encounter (Signed)
?  Follow up Call- ? ? ?  05/10/2021  ?  2:02 PM  ?Call back number  ?Post procedure Call Back phone  # [phone]  ?Permission to leave phone message Yes  ?  ? ?Patient questions: ? ?Do you have a fever, pain , or abdominal swelling? No. ?Pain Score  0 * ? ?Have you tolerated food without any problems? Yes.   ? ?Have you been able to return to your normal activities? Yes.   ? ?Do you have any questions about your discharge instructions: ?Diet   No. ?Medications  No. ?Follow up visit  No. ? ?Do you have questions or concerns about your Care? Yes.   ? ?States the first few days has had pain with swallowing, no pain right now.  Instruct pt to stick with soft foods, small bites and lots of liquids with swallowing, Tylenol is ok for mild pain, if pain increases or persists, call to office to report. ? ?Actions: ?* If pain score is 4 or above: ?No action needed, pain <4. ? ? ?

## 2021-05-14 NOTE — Telephone Encounter (Signed)
Thanks for the update. Awaiting path results.  ?

## 2021-05-20 ENCOUNTER — Other Ambulatory Visit: Payer: Self-pay

## 2021-05-20 DIAGNOSIS — K629 Disease of anus and rectum, unspecified: Secondary | ICD-10-CM

## 2021-05-21 ENCOUNTER — Other Ambulatory Visit: Payer: Self-pay

## 2021-05-21 ENCOUNTER — Other Ambulatory Visit (INDEPENDENT_AMBULATORY_CARE_PROVIDER_SITE_OTHER): Payer: Medicaid Other

## 2021-05-21 ENCOUNTER — Encounter: Payer: Self-pay | Admitting: Gastroenterology

## 2021-05-21 ENCOUNTER — Telehealth: Payer: Self-pay | Admitting: *Deleted

## 2021-05-21 DIAGNOSIS — R739 Hyperglycemia, unspecified: Secondary | ICD-10-CM | POA: Diagnosis not present

## 2021-05-21 DIAGNOSIS — K629 Disease of anus and rectum, unspecified: Secondary | ICD-10-CM | POA: Diagnosis not present

## 2021-05-21 DIAGNOSIS — D3A8 Other benign neuroendocrine tumors: Secondary | ICD-10-CM

## 2021-05-21 DIAGNOSIS — R768 Other specified abnormal immunological findings in serum: Secondary | ICD-10-CM

## 2021-05-21 LAB — BASIC METABOLIC PANEL
BUN: 15 mg/dL (ref 6–23)
CO2: 22 mEq/L (ref 19–32)
Calcium: 9.2 mg/dL (ref 8.4–10.5)
Chloride: 107 mEq/L (ref 96–112)
Creatinine, Ser: 0.73 mg/dL (ref 0.40–1.20)
GFR: 98.47 mL/min (ref >60.00)
Glucose, Bld: 97 mg/dL (ref 70–99)
Potassium: 4.1 mEq/L (ref 3.5–5.1)
Sodium: 139 mEq/L (ref 135–145)

## 2021-05-21 NOTE — Telephone Encounter (Signed)
error 

## 2021-05-21 NOTE — Telephone Encounter (Signed)
Patient came back the office spoke with her regarding CT scan and added on a BMET for her upcoming CT scan.  ?

## 2021-05-23 LAB — CHROMOGRANIN A: Chromogranin A (ng/mL): 139.4 ng/mL — ABNORMAL HIGH (ref 0.0–101.8)

## 2021-05-24 ENCOUNTER — Ambulatory Visit (HOSPITAL_BASED_OUTPATIENT_CLINIC_OR_DEPARTMENT_OTHER)
Admission: RE | Admit: 2021-05-24 | Discharge: 2021-05-24 | Disposition: A | Discharge status: Home / Self Care | Payer: Medicaid Other | Source: Ambulatory Visit | Attending: Gastroenterology | Admitting: Gastroenterology

## 2021-05-24 ENCOUNTER — Encounter (HOSPITAL_BASED_OUTPATIENT_CLINIC_OR_DEPARTMENT_OTHER): Payer: Self-pay

## 2021-05-24 DIAGNOSIS — K629 Disease of anus and rectum, unspecified: Secondary | ICD-10-CM | POA: Insufficient documentation

## 2021-05-24 DIAGNOSIS — I700 Atherosclerosis of aorta: Secondary | ICD-10-CM | POA: Diagnosis not present

## 2021-05-24 MED ORDER — IOHEXOL 300 MG/ML  SOLN
100.0000 mL | Freq: Once | INTRAMUSCULAR | Status: AC | PRN
  Administered 2021-05-24: 100 mL via INTRAVENOUS

## 2021-05-28 ENCOUNTER — Other Ambulatory Visit: Payer: Self-pay | Admitting: Family Medicine

## 2021-05-30 ENCOUNTER — Telehealth: Payer: Self-pay | Admitting: Gastroenterology

## 2021-05-30 NOTE — Telephone Encounter (Signed)
Following results were sent to pt via My Chart:  Vanessa Butler,    Your CT scan is overall reassuring.  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  The next step is certainly removal of the tumor as we have discussed.  Please let me know if you have any questions or concerns.   Thornton Park, MD, MPH [facility]

## 2021-05-30 NOTE — Telephone Encounter (Signed)
Patient is wanting to discuss findings on CT scan. Please advise.

## 2021-05-31 NOTE — Telephone Encounter (Signed)
Returned pt call. LVM requesting returned call. Will need to provide contact # for [facility] for her to f/u on scheduling status [phone].

## 2021-06-25 ENCOUNTER — Telehealth: Payer: Self-pay

## 2021-06-25 DIAGNOSIS — N939 Abnormal uterine and vaginal bleeding, unspecified: Secondary | ICD-10-CM

## 2021-06-25 NOTE — Telephone Encounter (Signed)
Patient called and left VM on nurse line requesting a call back regarding "depo pills" for heavy bleeding. Started taking the  Stopped taking Provera end of March 2023. Pt states she was instructed by provider to trial no Provera. Reports vaginal bleeding began middle of April 2023. Reports continuous daily bleeding since April. Reports restarting Provera tablets 2 weeks ago. States bleeding has not changed since beginning Provera. Describes changing pad 4 times each day, also passing clots. Reports right side is painful. Currently taking Tylenol Extra Strength for pain. States she feels like fibroids will need to be removed.   Explained I will review with Kennon Rounds, MD regarding vaginal bleeding. Also will contact front office to schedule follow up appt with Kennon Rounds. Recommended patient trial Naproxen or ibuprofen for pain in meantime.

## 2021-06-26 DIAGNOSIS — D3A026 Benign carcinoid tumor of the rectum: Secondary | ICD-10-CM | POA: Diagnosis not present

## 2021-06-26 MED ORDER — MEDROXYPROGESTERONE ACETATE 10 MG PO TABS: 10.0000 mg | ORAL_TABLET | Freq: Two times a day (BID) | ORAL | 2 refills | Status: DC

## 2021-06-26 NOTE — Telephone Encounter (Signed)
Donnamae Jude, MD  Annabell Howells, RN Have her take it twice daily until bleeding stops and then do 1x/day   Called pt to review. New prescription sent because patient states she will run out of medication if taking twice daily. Encouraged pt to call back if she has not heard from office to schedule appointment in the next few days.

## 2021-07-12 DIAGNOSIS — K219 Gastro-esophageal reflux disease without esophagitis: Secondary | ICD-10-CM | POA: Diagnosis not present

## 2021-07-12 DIAGNOSIS — D3A8 Other benign neuroendocrine tumors: Secondary | ICD-10-CM | POA: Diagnosis not present

## 2021-07-12 DIAGNOSIS — K6289 Other specified diseases of anus and rectum: Secondary | ICD-10-CM | POA: Diagnosis not present

## 2021-07-12 HISTORY — PX: OTHER SURGICAL HISTORY: SHX169

## 2021-07-19 ENCOUNTER — Other Ambulatory Visit: Payer: Self-pay | Admitting: Nurse Practitioner

## 2021-07-19 DIAGNOSIS — K219 Gastro-esophageal reflux disease without esophagitis: Secondary | ICD-10-CM

## 2021-07-31 ENCOUNTER — Telehealth: Payer: Self-pay | Admitting: *Deleted

## 2021-07-31 NOTE — Telephone Encounter (Signed)
Vanessa Butler left a message on voicemail today stating she sees Dr. Kennon Rounds and is still bleeding. States Dr. Kennon Rounds told her to take 2 Provera tablets a day. States she has been bleeding since 07/12/21, states not bleeding heavy , is light but sometimes passes clots.  Per chart review has gyn visit scheduled 08/22/21. I called Vanessa Butler and she confirms taking the pills, having light bleeding everyday and has to wear pantiliners and changes them 3-4 times a day. States she passes some small clots and notices it is slimy when she wipes. She reports on CT scan noted fibroids. I explained Dr. Kennon Rounds is not in office today, but I will send her a message and if there are recommendations for before her appointment, we will call her. She voices understanding. Staci Acosta

## 2021-08-08 ENCOUNTER — Telehealth: Payer: Self-pay

## 2021-08-08 NOTE — Telephone Encounter (Signed)
VM left on nurse line stating she is a patient of Dr. Kennon Rounds. States she called last week about bleeding. Currently taking 2 Provera tablets every day.

## 2021-08-08 NOTE — Telephone Encounter (Signed)
Patient was called, see other telephone note. Staci Acosta

## 2021-08-08 NOTE — Telephone Encounter (Signed)
Per Dr. Kennon Rounds she has not seen patient before, patient saw Dr. Rosana Hoes. She states she does not think she needs to change meds, but needs to keep scheduled appointment 08/22/21 and will discuss then.Also for patient to call if bleeding worsens.  I called Annabelle and explained Dr. Virginia Crews recommendations. She voices understanding. Staci Acosta

## 2021-08-22 ENCOUNTER — Other Ambulatory Visit (HOSPITAL_COMMUNITY)
Admission: RE | Admit: 2021-08-22 | Discharge: 2021-08-22 | Disposition: A | Discharge status: Home / Self Care | Payer: Medicaid Other | Source: Ambulatory Visit | Attending: Family Medicine | Admitting: Family Medicine

## 2021-08-22 ENCOUNTER — Other Ambulatory Visit: Payer: Self-pay | Admitting: Family Medicine

## 2021-08-22 ENCOUNTER — Encounter: Payer: Self-pay | Admitting: Family Medicine

## 2021-08-22 ENCOUNTER — Other Ambulatory Visit: Payer: Self-pay

## 2021-08-22 ENCOUNTER — Ambulatory Visit: Payer: Medicaid Other | Admitting: Family Medicine

## 2021-08-22 ENCOUNTER — Encounter: Payer: Self-pay | Admitting: *Deleted

## 2021-08-22 VITALS — BP 129/65 | HR 80 | Wt 145.0 lb

## 2021-08-22 DIAGNOSIS — N63 Unspecified lump in unspecified breast: Secondary | ICD-10-CM | POA: Diagnosis not present

## 2021-08-22 DIAGNOSIS — Z124 Encounter for screening for malignant neoplasm of cervix: Secondary | ICD-10-CM | POA: Insufficient documentation

## 2021-08-22 DIAGNOSIS — N939 Abnormal uterine and vaginal bleeding, unspecified: Secondary | ICD-10-CM

## 2021-08-22 NOTE — Progress Notes (Signed)
   Subjective:    Patient ID: Chistine Dematteo is a 46 y.o. female presenting with abnormal bleeding  on 08/22/2021  HPI: Patient reports colon polyp removal and after this began bleeding. Previously had a D and C with hysteroscopy and polyp removal. Taking Provera 20 mg daily and bleeding has continued. Had CT which showed some uterine fibroids. Pt. Is a G3P2102 with SVD x 3.  Review of Systems  Constitutional:  Negative for chills and fever.  Respiratory:  Negative for shortness of breath.   Cardiovascular:  Negative for chest pain.  Gastrointestinal:  Negative for abdominal pain, nausea and vomiting.  Genitourinary:  Negative for dysuria.  Skin:  Negative for rash.      Objective:    BP 129/65   Pulse 80   Wt 145 lb (65.8 kg)   BMI 28.32 kg/m  Physical Exam Exam conducted with a chaperone present.  Constitutional:      General: She is not in acute distress.    Appearance: She is well-developed.  HENT:     Head: Normocephalic and atraumatic.  Eyes:     General: No scleral icterus. Cardiovascular:     Rate and Rhythm: Normal rate.  Pulmonary:     Effort: Pulmonary effort is normal.  Abdominal:     Palpations: Abdomen is soft.  Genitourinary:    Comments: BUS normal, vagina is pink and rugated, cervix is parous without lesion, uterus is enlarged and retroverted and firm, no adnexal mass or tenderness.  Musculoskeletal:     Cervical back: Neck supple.  Skin:    General: Skin is warm and dry.  Neurological:     Mental Status: She is alert and oriented to person, place, and time.   Procedure: Patient given informed consent, signed copy in the chart, time out was performed. Appropriate time out taken. . The patient was placed in the lithotomy position and the cervix brought into view with sterile speculum.  Portio of cervix cleansed x 2 with betadine swabs.  A tenaculum was placed in the anterior lip of the cervix.  The uterus was sounded for depth of 8cm. A pipelle  was introduced to into the uterus, suction created,  and an endometrial sample was obtained. All equipment was removed and accounted for.  The patient tolerated the procedure well.       Assessment & Plan:   Problem List Items Addressed This Visit       Unprioritized   Abnormal uterine bleeding (AUB)    Check EMB, pelvic sonogram, labs today.  Patient does not want hysterectomy or surgery that involves incisions. Progesterone has not been effective. Discussed options and patient desires HTA.      Relevant Orders   US PELVIC COMPLETE WITH TRANSVAGINAL   TSH   Follicle stimulating hormone   CBC   Surgical pathology( [facility]/ [facility])   Other Visit Diagnoses     Screening for cervical cancer    -  Primary   Relevant Orders   Cytology - PAP( [place])   Mass of breast, unspecified laterality       Relevant Orders   MM DIAG BREAST TOMO BILATERAL       Return in about 3 months (around 11/22/2021) for postop check.  Donnamae Jude, MD 08/22/2021 2:58 PM

## 2021-08-22 NOTE — Assessment & Plan Note (Signed)
Check EMB, pelvic sonogram, labs today.  Patient does not want hysterectomy or surgery that involves incisions. Progesterone has not been effective. Discussed options and patient desires HTA.

## 2021-08-23 LAB — CBC
Hematocrit: 36 % (ref 34.0–46.6)
Hemoglobin: 12.6 g/dL (ref 11.1–15.9)
MCH: 34.1 pg — ABNORMAL HIGH (ref 26.6–33.0)
MCHC: 35 g/dL (ref 31.5–35.7)
MCV: 97 fL (ref 79–97)
Platelets: 339 10*3/uL (ref 150–450)
RBC: 3.7 x10E6/uL — ABNORMAL LOW (ref 3.77–5.28)
RDW: 12.7 % (ref 11.7–15.4)
WBC: 6.1 10*3/uL (ref 3.4–10.8)

## 2021-08-23 LAB — TSH: TSH: 1.17 u[IU]/mL (ref 0.450–4.500)

## 2021-08-23 LAB — FOLLICLE STIMULATING HORMONE: FSH: 21 m[IU]/mL

## 2021-08-26 ENCOUNTER — Ambulatory Visit (HOSPITAL_COMMUNITY)
Admission: RE | Admit: 2021-08-26 | Discharge: 2021-08-26 | Disposition: A | Discharge status: Home / Self Care | Payer: Medicaid Other | Source: Ambulatory Visit | Attending: Family Medicine | Admitting: Family Medicine

## 2021-08-26 DIAGNOSIS — D259 Leiomyoma of uterus, unspecified: Secondary | ICD-10-CM | POA: Diagnosis not present

## 2021-08-26 DIAGNOSIS — N939 Abnormal uterine and vaginal bleeding, unspecified: Secondary | ICD-10-CM | POA: Diagnosis not present

## 2021-08-26 LAB — SURGICAL PATHOLOGY

## 2021-08-28 LAB — CYTOLOGY - PAP
Comment: NEGATIVE
Diagnosis: NEGATIVE
Diagnosis: REACTIVE
High risk HPV: NEGATIVE

## 2021-09-11 ENCOUNTER — Ambulatory Visit
Admission: RE | Admit: 2021-09-11 | Discharge: 2021-09-11 | Disposition: A | Discharge status: Home / Self Care | Payer: Medicaid Other | Source: Ambulatory Visit | Attending: Family Medicine | Admitting: Family Medicine

## 2021-09-11 DIAGNOSIS — R922 Inconclusive mammogram: Secondary | ICD-10-CM | POA: Diagnosis not present

## 2021-09-11 DIAGNOSIS — N63 Unspecified lump in unspecified breast: Secondary | ICD-10-CM

## 2021-09-11 DIAGNOSIS — N6313 Unspecified lump in the right breast, lower outer quadrant: Secondary | ICD-10-CM | POA: Diagnosis not present

## 2021-09-11 DIAGNOSIS — N6311 Unspecified lump in the right breast, upper outer quadrant: Secondary | ICD-10-CM | POA: Diagnosis not present

## 2021-10-13 IMAGING — DX DG ABDOMEN 1V
1 series · 1 of 1 positions shown · non-contrast
Comparison: None.

CLINICAL DATA: Lower abdominal pain

EXAM:
ABDOMEN - 1 VIEW

[abdomen kub]
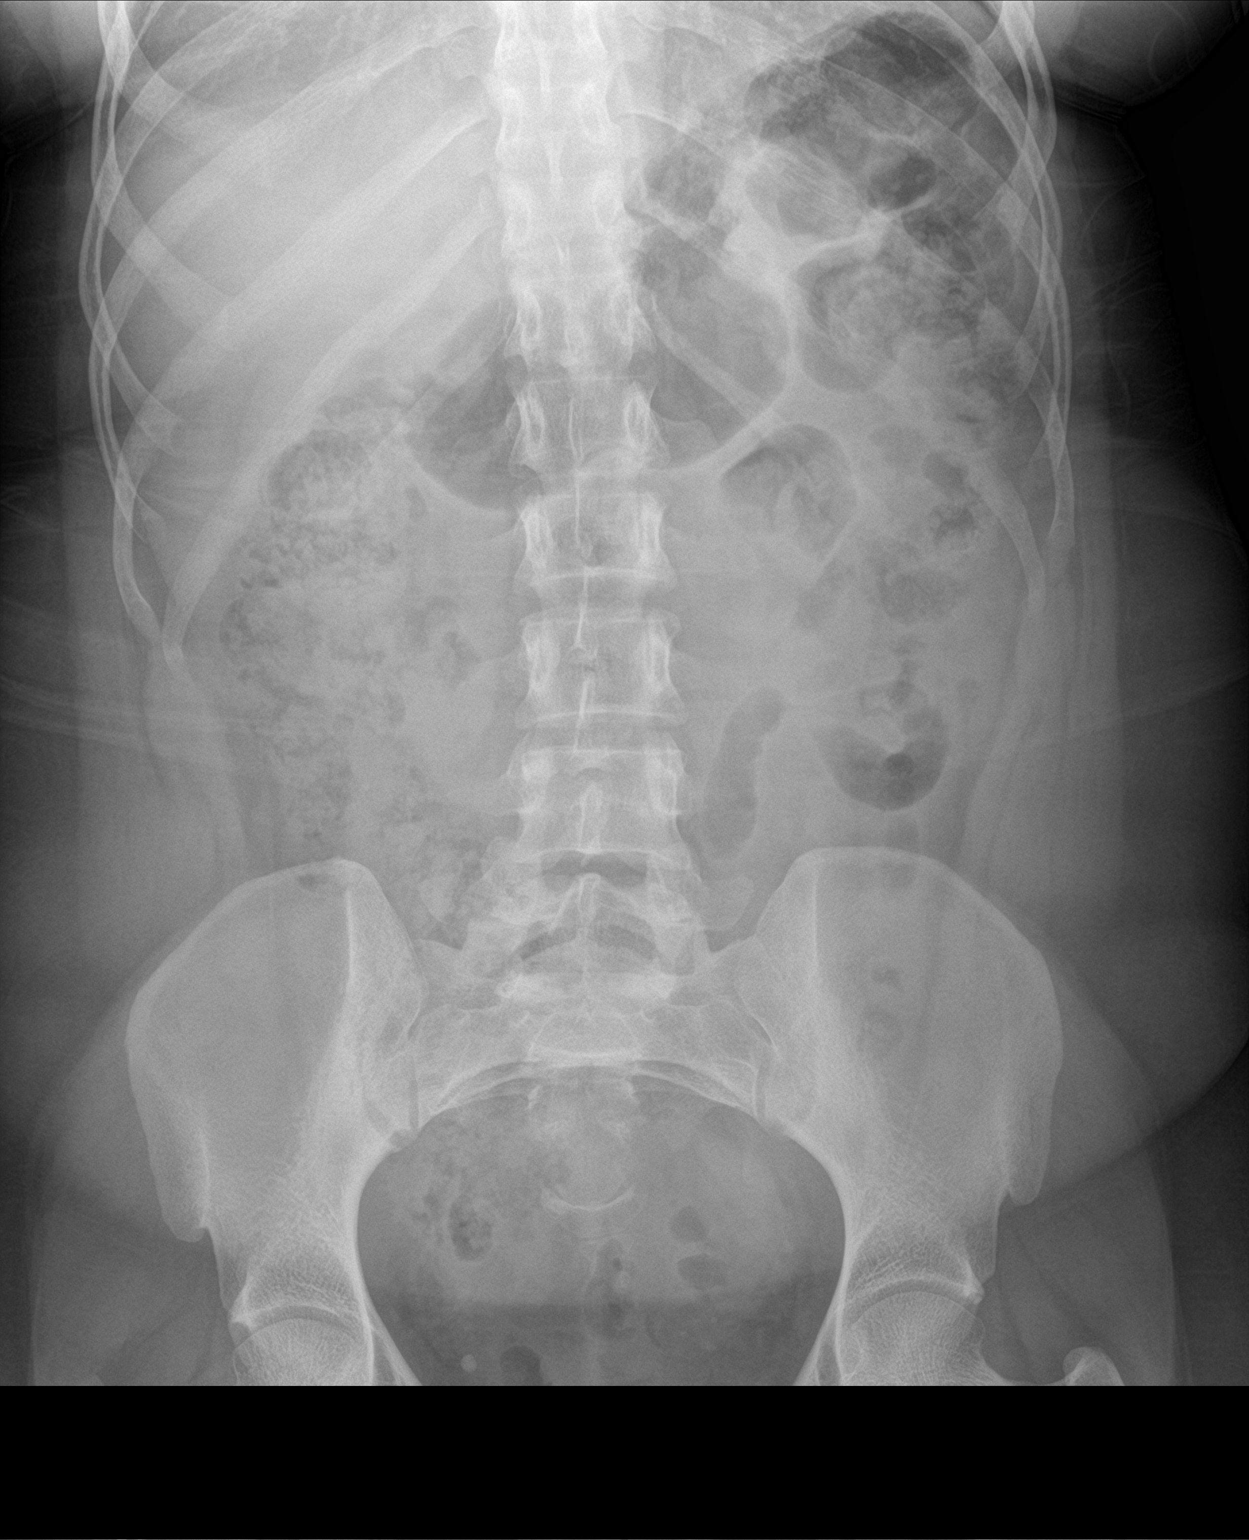

[1 of 1 positions shown; findings below may reference images not displayed]

FINDINGS: The bowel gas pattern is normal. No radio-opaque calculi or other
significant radiographic abnormality are seen.
IMPRESSION: Negative.

## 2021-10-14 ENCOUNTER — Other Ambulatory Visit: Payer: Self-pay

## 2021-10-14 ENCOUNTER — Encounter (HOSPITAL_BASED_OUTPATIENT_CLINIC_OR_DEPARTMENT_OTHER): Payer: Self-pay | Admitting: Family Medicine

## 2021-10-14 NOTE — Progress Notes (Addendum)
Spoke w/ via phone for pre-op interview---pt Lab needs dos---none per anesthesia, surgery orders pending-               Lab results------none COVID test -----patient states asymptomatic no test needed Arrive at -------1230 pm 10-24-2021 NPO after MN NO Solid Food.  Clear liquids from MN until---1130 am Med rec completed Medications to take morning of surgery -----none Diabetic medication -----n/a Patient instructed no nail polish to be worn day of surgery Patient instructed to bring photo id and insurance card day of surgery Patient aware to have Driver (ride ) / caregiver    for 24 hours after surgery  Patient Special Instructions -----none Pre-Op special Istructions -----surgery orders requested dr Kennon Rounds epic ib Patient verbalized understanding of instructions that were given at this phone interview. Patient denies shortness of breath, chest pain, fever, cough at this phone interview.

## 2021-10-15 NOTE — H&P (Signed)
Vanessa Butler is an 46 y.o. [id number] female.   Chief Complaint: abnormal uterine bleeding HPI: Patient with abnormal bleeding. Previously had a D and C with hysteroscopy and polyp removal. Taking Provera 20 mg daily and bleeding has continued. Had CT which showed some uterine fibroids. Pt. Is a G3P2102 with SVD x 3.  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   Vitamin B12 deficiency 12/2018   Vitamin D deficiency 12/2018    Past Surgical History:  Procedure Laterality Date   COLONOSCOPY  05/10/2021   DILATATION & CURETTAGE/HYSTEROSCOPY WITH MYOSURE N/A 09/21/2019   Procedure: DILATATION & CURETTAGE/HYSTEROSCOPY WITH MYOSURE;  Surgeon: DSloan Leiter MD;  [facility]  Service: Gynecology;  Laterality: N/A;   FOOT SURGERY Left 2008   HAND SURGERY Left 11/2020   to remove skin disorder keratoderma   [place] 07[place]   [place] [facility]ocrine [facility]  6 mm x 4 mm removed from rectum, repeat lower endo [place] 1 year   TUBAL LIGATION     UPPER GASTROINTESTINAL ENDOSCOPY  05/10/2021    Family History  Problem Relation Age of Onset   Diabetes Maternal Grandmother    Cancer Maternal Grandmother    Asthma Paternal Grandmother    Colon cancer Neg Hx    Pancreatic cancer Neg Hx    Stomach cancer Neg Hx    Liver cancer Neg Hx    Esophageal cancer Neg Hx    Social History:  reports that she has been smoking cigarettes. She has never used smokeless tobacco. She reports current alcohol use. She reports that she does not use drugs.  Allergies:  Allergies  Allergen Reactions   Doxycycline Nausea Only    No medications prior to admission.    Pertinent items are noted in HPI.  Height 5' (1.524 m), weight 65.8 kg. Ht 5' (1.524 m)   Wt  65.8 kg   BMI 28.32 kg/m  General appearance: alert, cooperative, and appears stated age Head: Normocephalic, without obvious abnormality, atraumatic Neck: supple, symmetrical, trachea midline Lungs:  normal effort Heart: regular rate and rhythm Abdomen: soft, non-tender; bowel sounds normal; no masses,  no organomegaly Extremities: extremities normal, atraumatic, no cyanosis or edema Skin: Skin color, texture, turgor normal. No rashes or lesions Neurologic: Grossly normal   No results found for this or any previous visit (from the past 24 hour(s)). No results found.  Assessment/Plan Principal Problem:   Abnormal uterine bleeding (AUB)  For D & C with Hysteroscopy and HTA. Risks include but are not limited to bleeding, infection, injury to surrounding structures, including bowel, bladder and ureters, blood clots, and death.  Likelihood of success is high.    TDonnamae Jude10/03/2021, 4:13 PM

## 2021-10-24 ENCOUNTER — Ambulatory Visit (HOSPITAL_BASED_OUTPATIENT_CLINIC_OR_DEPARTMENT_OTHER): Payer: Medicaid Other | Admitting: Certified Registered Nurse Anesthetist

## 2021-10-24 ENCOUNTER — Other Ambulatory Visit: Payer: Self-pay

## 2021-10-24 ENCOUNTER — Ambulatory Visit (HOSPITAL_BASED_OUTPATIENT_CLINIC_OR_DEPARTMENT_OTHER)
Admission: RE | Admit: 2021-10-24 | Discharge: 2021-10-24 | Disposition: A | Discharge status: Home / Self Care | Payer: Medicaid Other | Attending: Family Medicine | Admitting: Family Medicine

## 2021-10-24 ENCOUNTER — Encounter (HOSPITAL_BASED_OUTPATIENT_CLINIC_OR_DEPARTMENT_OTHER): Payer: Self-pay | Admitting: Family Medicine

## 2021-10-24 ENCOUNTER — Encounter (HOSPITAL_BASED_OUTPATIENT_CLINIC_OR_DEPARTMENT_OTHER)
Admission: RE | Disposition: A | Discharge status: Home / Self Care | Payer: Self-pay | Source: Home / Self Care | Attending: Family Medicine

## 2021-10-24 DIAGNOSIS — K219 Gastro-esophageal reflux disease without esophagitis: Secondary | ICD-10-CM | POA: Diagnosis not present

## 2021-10-24 DIAGNOSIS — F17210 Nicotine dependence, cigarettes, uncomplicated: Secondary | ICD-10-CM | POA: Insufficient documentation

## 2021-10-24 DIAGNOSIS — N939 Abnormal uterine and vaginal bleeding, unspecified: Secondary | ICD-10-CM | POA: Diagnosis not present

## 2021-10-24 DIAGNOSIS — N920 Excessive and frequent menstruation with regular cycle: Secondary | ICD-10-CM | POA: Insufficient documentation

## 2021-10-24 DIAGNOSIS — Z01818 Encounter for other preprocedural examination: Secondary | ICD-10-CM

## 2021-10-24 HISTORY — PX: DILITATION & CURRETTAGE/HYSTROSCOPY WITH HYDROTHERMAL ABLATION: SHX5570

## 2021-10-24 HISTORY — DX: Abnormal uterine and vaginal bleeding, unspecified: N93.9

## 2021-10-24 LAB — CBC
HCT: 43.8 % (ref 36.0–46.0)
Hemoglobin: 14.6 g/dL (ref 12.0–15.0)
MCH: 34.2 pg — ABNORMAL HIGH (ref 26.0–34.0)
MCHC: 33.3 g/dL (ref 30.0–36.0)
MCV: 102.6 fL — ABNORMAL HIGH (ref 80.0–100.0)
Platelets: 305 10*3/uL (ref 150–400)
RBC: 4.27 MIL/uL (ref 3.87–5.11)
RDW: 12.9 % (ref 11.5–15.5)
WBC: 5 10*3/uL (ref 4.0–10.5)
nRBC: 0 % (ref 0.0–0.2)

## 2021-10-24 LAB — BASIC METABOLIC PANEL
Anion gap: 7 (ref 5–15)
BUN: 12 mg/dL (ref 6–20)
CO2: 22 mmol/L (ref 22–32)
Calcium: 8.8 mg/dL — ABNORMAL LOW (ref 8.9–10.3)
Chloride: 107 mmol/L (ref 98–111)
Creatinine, Ser: 0.65 mg/dL (ref 0.44–1.00)
GFR, Estimated: >60 mL/min (ref >60)
Glucose, Bld: 84 mg/dL (ref 70–99)
Potassium: 4.1 mmol/L (ref 3.5–5.1)
Sodium: 136 mmol/L (ref 135–145)

## 2021-10-24 LAB — POCT PREGNANCY, URINE: Preg Test, Ur: NEGATIVE

## 2021-10-24 SURGERY — DILATATION & CURETTAGE/HYSTEROSCOPY WITH HYDROTHERMAL ABLATION
Anesthesia: General | Site: Uterus

## 2021-10-24 MED ORDER — LIDOCAINE HCL (PF) 2 % IJ SOLN
INTRAMUSCULAR | Status: AC
  Filled 2021-10-24: qty 5

## 2021-10-24 MED ORDER — DEXAMETHASONE SODIUM PHOSPHATE 10 MG/ML IJ SOLN
INTRAMUSCULAR | Status: AC
  Filled 2021-10-24: qty 1

## 2021-10-24 MED ORDER — SODIUM CHLORIDE 0.9 % IR SOLN
Status: DC | PRN
  Administered 2021-10-24: 3000 mL

## 2021-10-24 MED ORDER — OXYCODONE HCL 5 MG PO TABS
ORAL_TABLET | ORAL | Status: AC
  Filled 2021-10-24: qty 1

## 2021-10-24 MED ORDER — PROPOFOL 10 MG/ML IV BOLUS
INTRAVENOUS | Status: DC | PRN
  Administered 2021-10-24: 200 mg via INTRAVENOUS

## 2021-10-24 MED ORDER — LACTATED RINGERS IV SOLN: INTRAVENOUS | Status: DC

## 2021-10-24 MED ORDER — MIDAZOLAM HCL 2 MG/2ML IJ SOLN
INTRAMUSCULAR | Status: AC
  Filled 2021-10-24: qty 2

## 2021-10-24 MED ORDER — AMISULPRIDE (ANTIEMETIC) 5 MG/2ML IV SOLN: 10.0000 mg | Freq: Once | INTRAVENOUS | Status: DC | PRN

## 2021-10-24 MED ORDER — PHENYLEPHRINE 80 MCG/ML (10ML) SYRINGE FOR IV PUSH (FOR BLOOD PRESSURE SUPPORT)
PREFILLED_SYRINGE | INTRAVENOUS | Status: DC | PRN
  Administered 2021-10-24: 80 ug via INTRAVENOUS
  Administered 2021-10-24: 240 ug via INTRAVENOUS
  Administered 2021-10-24: 80 ug via INTRAVENOUS

## 2021-10-24 MED ORDER — FENTANYL CITRATE (PF) 100 MCG/2ML IJ SOLN
INTRAMUSCULAR | Status: DC | PRN
  Administered 2021-10-24: 100 ug via INTRAVENOUS

## 2021-10-24 MED ORDER — CELECOXIB 200 MG PO CAPS
400.0000 mg | ORAL_CAPSULE | ORAL | Status: AC
  Administered 2021-10-24: 400 mg via ORAL

## 2021-10-24 MED ORDER — GABAPENTIN 300 MG PO CAPS
ORAL_CAPSULE | ORAL | Status: AC
  Filled 2021-10-24: qty 1

## 2021-10-24 MED ORDER — PROMETHAZINE HCL 25 MG/ML IJ SOLN: 6.2500 mg | INTRAMUSCULAR | Status: DC | PRN

## 2021-10-24 MED ORDER — CELECOXIB 200 MG PO CAPS
ORAL_CAPSULE | ORAL | Status: AC
  Filled 2021-10-24: qty 2

## 2021-10-24 MED ORDER — EPHEDRINE SULFATE-NACL 50-0.9 MG/10ML-% IV SOSY
PREFILLED_SYRINGE | INTRAVENOUS | Status: DC | PRN
  Administered 2021-10-24: 5 mg via INTRAVENOUS

## 2021-10-24 MED ORDER — FENTANYL CITRATE (PF) 100 MCG/2ML IJ SOLN
INTRAMUSCULAR | Status: AC
  Filled 2021-10-24: qty 2

## 2021-10-24 MED ORDER — LIDOCAINE 2% (20 MG/ML) 5 ML SYRINGE
INTRAMUSCULAR | Status: DC | PRN
  Administered 2021-10-24: 60 mg via INTRAVENOUS

## 2021-10-24 MED ORDER — DEXAMETHASONE SODIUM PHOSPHATE 10 MG/ML IJ SOLN
INTRAMUSCULAR | Status: DC | PRN
  Administered 2021-10-24: 10 mg via INTRAVENOUS

## 2021-10-24 MED ORDER — OXYCODONE HCL 5 MG/5ML PO SOLN: 5.0000 mg | Freq: Once | ORAL | Status: AC | PRN

## 2021-10-24 MED ORDER — HYDROMORPHONE HCL 1 MG/ML IJ SOLN
0.2500 mg | INTRAMUSCULAR | Status: DC | PRN
  Administered 2021-10-24: 0.5 mg via INTRAVENOUS

## 2021-10-24 MED ORDER — LACTATED RINGERS IV SOLN
INTRAVENOUS | Status: DC
Start: 2021-10-24 — End: 2021-10-24

## 2021-10-24 MED ORDER — ACETAMINOPHEN 500 MG PO TABS
1000.0000 mg | ORAL_TABLET | ORAL | Status: AC
  Administered 2021-10-24: 1000 mg via ORAL

## 2021-10-24 MED ORDER — ONDANSETRON HCL 4 MG/2ML IJ SOLN
INTRAMUSCULAR | Status: AC
  Filled 2021-10-24: qty 2

## 2021-10-24 MED ORDER — PHENYLEPHRINE 80 MCG/ML (10ML) SYRINGE FOR IV PUSH (FOR BLOOD PRESSURE SUPPORT)
PREFILLED_SYRINGE | INTRAVENOUS | Status: AC
  Filled 2021-10-24: qty 10

## 2021-10-24 MED ORDER — PHENYLEPHRINE HCL-NACL 20-0.9 MG/250ML-% IV SOLN
INTRAVENOUS | Status: DC | PRN
  Administered 2021-10-24: 40 ug/min via INTRAVENOUS

## 2021-10-24 MED ORDER — GABAPENTIN 300 MG PO CAPS
300.0000 mg | ORAL_CAPSULE | ORAL | Status: AC
  Administered 2021-10-24: 300 mg via ORAL

## 2021-10-24 MED ORDER — POVIDONE-IODINE 10 % EX SWAB: 2.0000 | Freq: Once | CUTANEOUS | Status: DC

## 2021-10-24 MED ORDER — HYDROMORPHONE HCL 1 MG/ML IJ SOLN
INTRAMUSCULAR | Status: AC
  Filled 2021-10-24: qty 1

## 2021-10-24 MED ORDER — MEPERIDINE HCL 25 MG/ML IJ SOLN: 6.2500 mg | INTRAMUSCULAR | Status: DC | PRN

## 2021-10-24 MED ORDER — MIDAZOLAM HCL 5 MG/5ML IJ SOLN
INTRAMUSCULAR | Status: DC | PRN
  Administered 2021-10-24: 2 mg via INTRAVENOUS

## 2021-10-24 MED ORDER — OXYCODONE HCL 5 MG PO TABS: 5.0000 mg | ORAL_TABLET | Freq: Four times a day (QID) | ORAL | 0 refills | Status: DC | PRN

## 2021-10-24 MED ORDER — EPHEDRINE 5 MG/ML INJ
INTRAVENOUS | Status: AC
  Filled 2021-10-24: qty 5

## 2021-10-24 MED ORDER — OXYCODONE HCL 5 MG PO TABS
5.0000 mg | ORAL_TABLET | Freq: Once | ORAL | Status: AC | PRN
  Administered 2021-10-24: 5 mg via ORAL

## 2021-10-24 MED ORDER — ONDANSETRON HCL 4 MG/2ML IJ SOLN
INTRAMUSCULAR | Status: DC | PRN
  Administered 2021-10-24: 4 mg via INTRAVENOUS

## 2021-10-24 MED ORDER — ACETAMINOPHEN 500 MG PO TABS
ORAL_TABLET | ORAL | Status: AC
  Filled 2021-10-24: qty 2

## 2021-10-24 MED ORDER — LIDOCAINE HCL 1 % IJ SOLN
INTRAMUSCULAR | Status: DC | PRN
  Administered 2021-10-24: 20 mL

## 2021-10-24 SURGICAL SUPPLY — 14 items
CATH ROBINSON RED A/P 16FR (CATHETERS) ×1 IMPLANT
DILATOR CANAL MILEX (MISCELLANEOUS) IMPLANT
DRSG TELFA 3X8 NADH STRL (GAUZE/BANDAGES/DRESSINGS) ×1 IMPLANT
GAUZE 4X4 16PLY ~~LOC~~+RFID DBL (SPONGE) ×2 IMPLANT
GLOVE BIOGEL PI IND STRL 7.0 (GLOVE) ×1 IMPLANT
GLOVE ECLIPSE 7.0 STRL STRAW (GLOVE) ×1 IMPLANT
GOWN STRL REUS W/TWL XL LVL3 (GOWN DISPOSABLE) ×1 IMPLANT
KIT TURNOVER CYSTO (KITS) ×1 IMPLANT
PACK VAGINAL MINOR WOMEN LF (CUSTOM PROCEDURE TRAY) ×1 IMPLANT
PAD OB MATERNITY 4.3X12.25 (PERSONAL CARE ITEMS) ×1 IMPLANT
PAD PREP 24X48 CUFFED NSTRL (MISCELLANEOUS) ×1 IMPLANT
SEAL ROD LENS SCOPE MYOSURE (ABLATOR) ×1 IMPLANT
SET GENESYS HTA PROCERVA (MISCELLANEOUS) ×1 IMPLANT
TOWEL OR 17X26 10 PK STRL BLUE (TOWEL DISPOSABLE) ×2 IMPLANT

## 2021-10-24 NOTE — Discharge Instructions (Addendum)
No acetaminophen/Tylenol until after 6:18pm today if needed for pain.    No ibuprofen, Advil, Aleve, Motrin, ketorolac, meloxicam, naproxen, or other NSAIDS until after 6:18pm today if needed for pain.    DISCHARGE INSTRUCTIONS: HYSTEROSCOPY / ENDOMETRIAL ABLATION The following instructions have been prepared to help you care for yourself upon your return home.   Personal hygiene:  Use sanitary pads for vaginal drainage, not tampons.  Shower the day after your procedure.  NO tub baths, pools or Jacuzzis for 2-3 weeks.  Wipe front to back after using the bathroom.  Activity and limitations:  Do NOT drive or operate any equipment for 24 hours. The effects of anesthesia are still present and drowsiness may result.  Do NOT rest in bed all day.  Walking is encouraged.  Walk up and down stairs slowly.  You may resume your normal activity in one to two days or as indicated by your physician. Sexual activity: NO intercourse for at least 2 weeks after the procedure, or as indicated by your Doctor.  Diet: Eat a light meal as desired this evening. You may resume your usual diet tomorrow.  Return to Work: You may resume your work activities in one to two days or as indicated by Marine scientist.  What to expect after your surgery: Expect to have vaginal bleeding/discharge for 2-3 days and spotting for up to 10 days. It is not unusual to have soreness for up to 1-2 weeks. You may have a slight burning sensation when you urinate for the first day. Mild cramps may continue for a couple of days. You may have a regular period in 2-6 weeks.  Call your doctor for any of the following:  Excessive vaginal bleeding or clotting, saturating and changing one pad every hour.  Inability to urinate 6 hours after discharge from hospital.  Pain not relieved by pain medication.  Fever of 100.4 F or greater.  Unusual vaginal discharge or odor.          Post Anesthesia Home Care  Instructions  Activity: Get plenty of rest for the remainder of the day. A responsible individual must stay with you for 24 hours following the procedure.  For the next 24 hours, DO NOT: -Drive a car -Paediatric nurse -Drink alcoholic beverages -Take any medication unless instructed by your physician -Make any legal decisions or sign important papers.  Meals: Start with liquid foods such as gelatin or soup. Progress to regular foods as tolerated. Avoid greasy, spicy, heavy foods. If nausea and/or vomiting occur, drink only clear liquids until the nausea and/or vomiting subsides. Call your physician if vomiting continues.  Special Instructions/Symptoms: Your throat may feel dry or sore from the anesthesia or the breathing tube placed in your throat during surgery. If this causes discomfort, gargle with warm salt water. The discomfort should disappear within 24 hours.

## 2021-10-24 NOTE — Interval H&P Note (Signed)
History and Physical Interval Note:  10/24/2021 2:02 PM  Vanessa Butler  has presented today for surgery, with the diagnosis of AUB.  The various methods of treatment have been discussed with the patient and family. After consideration of risks, benefits and other options for treatment, the patient has consented to  Procedure(s): DILATATION & CURETTAGE/HYSTEROSCOPY WITH HYDROTHERMAL ABLATION (N/A) as a surgical intervention.  The patient's history has been reviewed, patient examined, no change in status, stable for surgery.  I have reviewed the patient's chart and labs.  Questions were answered to the patient's satisfaction.     Donnamae Jude

## 2021-10-24 NOTE — Anesthesia Preprocedure Evaluation (Signed)
Anesthesia Evaluation  Patient identified by MRN, date of birth, ID band Patient awake    Reviewed: Allergy & Precautions, NPO status , Patient's Chart, lab work & pertinent test results  Airway Mallampati: II  TM Distance: >3 FB Neck ROM: Full    Dental no notable dental hx.    Pulmonary neg pulmonary ROS, Current Smoker and Patient abstained from smoking.,    Pulmonary exam normal breath sounds clear to auscultation       Cardiovascular negative cardio ROS Normal cardiovascular exam Rhythm:Regular Rate:Normal     Neuro/Psych negative neurological ROS  negative psych ROS   GI/Hepatic Neg liver ROS, GERD  ,  Endo/Other  negative endocrine ROS  Renal/GU negative Renal ROS  negative genitourinary   Musculoskeletal negative musculoskeletal ROS (+)   Abdominal   Peds negative pediatric ROS (+)  Hematology negative hematology ROS (+)   Anesthesia Other Findings   Reproductive/Obstetrics negative OB ROS                             Anesthesia Physical  Anesthesia Plan  ASA: II  Anesthesia Plan: General   Post-op Pain Management: Tylenol PO (pre-op)*, Celebrex PO (pre-op)*, Gabapentin PO (pre-op)* and Toradol IV (intra-op)*   Induction: Intravenous  PONV Risk Score and Plan: 2 and Ondansetron, Midazolam and Treatment may vary due to age or medical condition  Airway Management Planned: LMA  Additional Equipment:   Intra-op Plan:   Post-operative Plan: Extubation in OR  Informed Consent: I have reviewed the patients History and Physical, chart, labs and discussed the procedure including the risks, benefits and alternatives for the proposed anesthesia with the patient or authorized representative who has indicated his/her understanding and acceptance.     Dental advisory given  Plan Discussed with: CRNA  Anesthesia Plan Comments:         Anesthesia Quick Evaluation

## 2021-10-24 NOTE — Transfer of Care (Signed)
Immediate Anesthesia Transfer of Care Note  Patient: Vanessa Butler  Procedure(s) Performed: DILATATION & CURETTAGE/HYSTEROSCOPY WITH HYDROTHERMAL ABLATION (Uterus)  Patient Location: PACU  Anesthesia Type:General  Level of Consciousness: drowsy, patient cooperative and responds to stimulation  Airway & Oxygen Therapy: Patient Spontanous Breathing and Patient connected to face mask oxygen  Post-op Assessment: Report given to RN and Post -op Vital signs reviewed and stable  Post vital signs: Reviewed and stable  Last Vitals:  Vitals Value Taken Time  BP 127/72 10/24/21 1515  Temp    Pulse 98 10/24/21 1517  Resp 21 10/24/21 1517  SpO2 98 % 10/24/21 1517  Vitals shown include unvalidated device data.  Last Pain:  Vitals:   10/24/21 1216  TempSrc: Oral  PainSc: 0-No pain      Patients Stated Pain Goal: 6 (85/50/15 8682)  Complications: No notable events documented.

## 2021-10-24 NOTE — Op Note (Signed)
.  Preoperative diagnosis: Menorrhagia  Postoperative diagnosis: Menorrhagia  Procedure: HTA endometrial ablation, hysteroscopy, D & C  Surgeon: Standley Dakins. Kennon Rounds, M.D.  Anesthesia: GETT [name], MD Paracervical block  Findings: Normal appearing cervix and uterine cavity with both ostia seen.  Estimated blood loss: Minimal  Specimens: Endometrial curettings  Disposition of specimen: Pathology  Reason for procedure: Patient is a 46 y.o. [id number] with abnormal bleeding who desired definitive treatment.   Procedure: Patient was taken to the OR where she was placed in dorsal lithotomy in [place]. SCDs were in place. An adequate timeout was obtained. The patient was prepped and draped in the usual sterile fashion. A red rubber catheter is used to drain her bladder. A speculum was placed inside the vagina and the cervix visualized. The cervix was grasped anteriorly with a single-tooth tenaculum. 20 cc of quarter percent Marcaine were injected for paracervical block. The uterus sounded to 10 cm. Sequential dilation was done to a #8 dilator, and the HTA with hysteroscope was introduced into the uterine cavity. The cervix and endometrial lining appeared normal both ostia were seen there was no deformity of the cavity. A seal test was done and was passed. The uterine cavity was heated to between 80 and 90C and HTA was performed for 10 minutes. Cooling was then performed and the instrument removed. Sharp curettage was then performed and sample sent to pathology. All instrument, needle, and lap counts were correct x2. The patient was awakened taken to recovery room in stable condition.  Donnamae Jude MD 10/24/2021 3:15 PM

## 2021-10-24 NOTE — Anesthesia Procedure Notes (Signed)
Procedure Name: LMA Insertion Date/Time: 10/24/2021 2:32 PM  Performed by: Rogers Blocker, CRNAPre-anesthesia Checklist: Patient identified, Emergency Drugs available, Suction available and Patient being monitored Patient Re-evaluated:Patient Re-evaluated prior to induction Oxygen Delivery Method: Circle System Utilized Preoxygenation: Pre-oxygenation with 100% oxygen Induction Type: IV induction Ventilation: Mask ventilation without difficulty LMA: LMA inserted LMA Size: 4.0 Number of attempts: 1 Placement Confirmation: positive ETCO2 Tube secured with: Tape Dental Injury: Teeth and Oropharynx as per pre-operative assessment

## 2021-10-25 ENCOUNTER — Encounter (HOSPITAL_BASED_OUTPATIENT_CLINIC_OR_DEPARTMENT_OTHER): Payer: Self-pay | Admitting: Family Medicine

## 2021-10-25 LAB — SURGICAL PATHOLOGY

## 2021-10-25 NOTE — Anesthesia Postprocedure Evaluation (Signed)
Anesthesia Post Note  Patient: Vanessa Butler  Procedure(s) Performed: DILATATION & CURETTAGE/HYSTEROSCOPY WITH HYDROTHERMAL ABLATION (Uterus)     Patient location during evaluation: PACU Anesthesia Type: General Level of consciousness: awake and alert Pain management: pain level controlled Vital Signs Assessment: post-procedure vital signs reviewed and stable Respiratory status: spontaneous breathing, nonlabored ventilation and respiratory function stable Cardiovascular status: blood pressure returned to baseline and stable Postop Assessment: no apparent nausea or vomiting Anesthetic complications: no   No notable events documented.  Last Vitals:  Vitals:   10/24/21 1600 10/24/21 1630  BP: 120/74 139/87  Pulse: 92 93  Resp: 17 18  Temp: 36.9 C 36.9 C  SpO2: 99% 98%    Last Pain:  Vitals:   10/24/21 1630  TempSrc:   PainSc: [place]

## 2021-11-20 ENCOUNTER — Telehealth: Payer: Self-pay | Admitting: Nurse Practitioner

## 2021-11-20 NOTE — Telephone Encounter (Signed)
Pt called requesting pain medication. Says Crystal used to call her in hydrocodones or tylenol 3s due to a skin condition. Pt states she is in a lot of pain and has been trying to get by without medication until her surgery but is now requesting pain medication. Has an appointment 11/27/21

## 2021-11-21 ENCOUNTER — Ambulatory Visit (INDEPENDENT_AMBULATORY_CARE_PROVIDER_SITE_OTHER): Payer: Medicaid Other | Admitting: Podiatry

## 2021-11-21 DIAGNOSIS — Q828 Other specified congenital malformations of skin: Secondary | ICD-10-CM | POA: Diagnosis not present

## 2021-11-21 MED ORDER — TACROLIMUS 0.1 % EX OINT: TOPICAL_OINTMENT | Freq: Two times a day (BID) | CUTANEOUS | 0 refills | Status: DC

## 2021-11-21 MED ORDER — CLOBETASOL PROPIONATE 0.05 % EX OINT: 1.0000 | TOPICAL_OINTMENT | Freq: Two times a day (BID) | CUTANEOUS | 0 refills | Status: DC

## 2021-11-22 ENCOUNTER — Other Ambulatory Visit: Payer: Self-pay | Admitting: Nurse Practitioner

## 2021-11-22 NOTE — Telephone Encounter (Signed)
I have never seen this patient. We do not prescribe controlled pain meds. Please have her reach out to her dermatologist. Thanks.

## 2021-11-24 NOTE — Progress Notes (Signed)
  Subjective:  Patient ID: Vanessa Butler, female    DOB: 1975-02-13,  MRN: [id number]  Chief Complaint  Patient presents with   Foot Pain    New Patient left foot - palmoplantar keratoderma. Patient used to see Dr. Harriet Masson and he would take her to surgery to remove due to extreme pain    46 y.o. female presents with the above complaint. History confirmed with patient.  She is also had lesions on her hand as well.  She has been seen by dermatology plastic surgery and orthopedics at [facility], she has had previous excision most recently of last year on her hands these lesions have returned as well.  The foot is more painful now.  Objective:  Physical Exam: warm, good capillary refill, no trophic changes or ulcerative lesions, normal DP and PT pulses, normal sensory exam, and left foot multiple painful hyperkeratotic lesions on the medial glabrous junction of the foot. . Assessment:   1. Palmoplantar keratoderma      Plan:  Patient was evaluated and treated and all questions answered.  She presents today with increase in requesting consultation regarding surgical excision of her lesions again.  She has had surgery to remove these previously with they have regrown.  She says that she feels like it was worth it because they lasted for so long since the last time they were excised by Dr. Blenda Mounts.  She recently saw an orthopedic surgeon for consult for the [facility] and he said that he would not operate on her while she is smoking.  She smokes approximately 1 pack/day.  I discussed with her that I think long-term that surgery is not likely to help her and lesions will recur regardless as this is an inherited condition with no known cure.  I discussed with her as well that this is not something I have much clinical and surgical experience with and so would not recommend surgical excision.  This is obviously disappointing upsetting for her.  I advised her to work on her smoking cessation and follow-up  with the orthopedic surgeon that may consider surgery.  She will follow-up with [place] as needed.  She has tried multiple other treatments as well including laser and steroid creams which is what I recommended but she says she is not interested in this at the time  Return if symptoms worsen or fail to improve.

## 2021-11-25 NOTE — Telephone Encounter (Signed)
Pt was advised Vanessa Butler 

## 2021-11-27 ENCOUNTER — Encounter: Payer: Self-pay | Admitting: Nurse Practitioner

## 2021-11-27 ENCOUNTER — Ambulatory Visit (INDEPENDENT_AMBULATORY_CARE_PROVIDER_SITE_OTHER): Payer: Medicaid Other | Admitting: Nurse Practitioner

## 2021-11-27 VITALS — BP 131/80 | HR 88 | Ht 60.0 in | Wt 146.0 lb

## 2021-11-27 DIAGNOSIS — Z716 Tobacco abuse counseling: Secondary | ICD-10-CM

## 2021-11-27 DIAGNOSIS — Q828 Other specified congenital malformations of skin: Secondary | ICD-10-CM

## 2021-11-27 MED ORDER — IBUPROFEN 600 MG PO TABS: 600.0000 mg | ORAL_TABLET | Freq: Three times a day (TID) | ORAL | 0 refills | Status: DC | PRN

## 2021-11-27 MED ORDER — VARENICLINE TARTRATE (STARTER) 0.5 MG X 11 & 1 MG X 42 PO TBPK: 0.5000 mg | ORAL_TABLET | Freq: Every day | ORAL | 0 refills | Status: DC

## 2021-11-27 NOTE — Patient Instructions (Addendum)
1. Palmoplantar keratoderma  - Ambulatory referral to Podiatry - ibuprofen (ADVIL) 600 MG tablet; Take 1 tablet (600 mg total) by mouth every 8 (eight) hours as needed.  Dispense: 30 tablet; Refill: 0  2. Encounter for smoking cessation counseling  - Varenicline Tartrate, Starter, (CHANTIX STARTING MONTH PAK) 0.5 MG X 11 & 1 MG X 42 TBPK; Take 0.5 mg by mouth daily.  Dispense: 53 each; Refill: 0   Follow up:  Follow up as needed     Managing the Challenge of Quitting Smoking Quitting smoking is a physical and mental challenge. You may have cravings, withdrawal symptoms, and temptation to smoke. Before quitting, work with your health care provider to make a plan that can help you manage quitting. Making a plan before you quit may keep you from smoking when you have the urge to smoke while trying to quit. How to manage lifestyle changes Managing stress Stress can make you want to smoke, and wanting to smoke may cause stress. It is important to find ways to manage your stress. You could try some of the following: Practice relaxation techniques. Breathe slowly and deeply, in through your nose and out through your mouth. Listen to music. Soak in a bath or take a shower. Imagine a peaceful place or vacation. Get some support. Talk with family or friends about your stress. Join a support group. Talk with a counselor or therapist. Get some physical activity. Go for a walk, run, or bike ride. Play a favorite sport. Practice yoga.  Medicines Talk with your health care provider about medicines that might help you deal with cravings and make quitting easier for you. Relationships Social situations can be difficult when you are quitting smoking. To manage this, you can: Avoid parties and other social situations where people might be smoking. Avoid alcohol. Leave right away if you have the urge to smoke. Explain to your family and friends that you are quitting smoking. Ask for support  and let them know you might be a bit grumpy. Plan activities where smoking is not an option. General instructions Be aware that many people gain weight after they quit smoking. However, not everyone does. To keep from gaining weight, have a plan in place before you quit, and stick to the plan after you quit. Your plan should include: Eating healthy snacks. When you have a craving, it may help to: Eat popcorn, or try carrots, celery, or other cut vegetables. Chew sugar-free gum. Changing how you eat. Eat small portion sizes at meals. Eat 4-6 small meals throughout the day instead of 1-2 large meals a day. Be mindful when you eat. You should avoid watching television or doing other things that might distract you as you eat. Exercising regularly. Make time to exercise each day. If you do not have time for a long workout, do short bouts of exercise for 5-10 minutes several times a day. Do some form of strengthening exercise, such as weight lifting. Do some exercise that gets your heart beating and causes you to breathe deeply, such as walking fast, running, swimming, or biking. This is very important. Drinking plenty of water or other low-calorie or no-calorie drinks. Drink enough fluid to keep your urine pale yellow.  How to recognize withdrawal symptoms Your body and mind may experience discomfort as you try to get used to not having nicotine in your system. These effects are called withdrawal symptoms. They may include: Feeling hungrier than normal. Having trouble concentrating. Feeling irritable or restless. Having trouble  sleeping. Feeling depressed. Craving a cigarette. These symptoms may surprise you, but they are normal to have when quitting smoking. To manage withdrawal symptoms: Avoid places, people, and activities that trigger your cravings. Remember why you want to quit. Get plenty of sleep. Avoid coffee and other drinks that contain caffeine. These may worsen some of your  symptoms. How to manage cravings Come up with a plan for how to deal with your cravings. The plan should include the following: A definition of the specific situation you want to deal with. An activity or action you will take to replace smoking. A clear idea for how this action will help. The name of someone who could help you with this. Cravings usually last for 5-10 minutes. Consider taking the following actions to help you with your plan to deal with cravings: Keep your mouth busy. Chew sugar-free gum. Suck on hard candies or a straw. Brush your teeth. Keep your hands and body busy. Change to a different activity right away. Squeeze or play with a ball. Do an activity or a hobby, such as making bead jewelry, practicing needlepoint, or working with wood. Mix up your normal routine. Take a short exercise break. Go for a quick walk, or run up and down stairs. Focus on doing something kind or helpful for someone else. Call a friend or family member to talk during a craving. Join a support group. Contact a quitline. Where to find support To get help or find a support group: Call the [facility]'s Smoking Quitline: 1-800-QUIT-NOW 772 549 8484) Text QUIT to SmokefreeTXT: 559741 Where to find more information Visit these websites to find more information on quitting smoking: U.S. Department of Health and Human Services: www.smokefree.gov American Lung Association: www.freedomfromsmoking.org Centers for Disease Control and Prevention (CDC): http://www.wolf.info/ American Heart Association: www.heart.org Contact a health care provider if: You want to change your plan for quitting. The medicines you are taking are not helping. Your eating feels out of control or you cannot sleep. You feel depressed or become very anxious. Summary Quitting smoking is a physical and mental challenge. You will face cravings, withdrawal symptoms, and temptation to smoke again. Preparation can help you as  you go through these challenges. Try different techniques to manage stress, handle social situations, and prevent weight gain. You can deal with cravings by keeping your mouth busy (such as by chewing gum), keeping your hands and body busy, calling family or friends, or contacting a quitline for people who want to quit smoking. You can deal with withdrawal symptoms by avoiding places where people smoke, getting plenty of rest, and avoiding drinks that contain caffeine. This information is not intended to replace advice given to you by your health care provider. Make sure you discuss any questions you have with your health care provider. Document Revised: 12/21/2020 Document Reviewed: 12/21/2020 Elsevier Patient Education  [place].

## 2021-11-27 NOTE — Assessment & Plan Note (Signed)
-   Ambulatory referral to Podiatry - ibuprofen (ADVIL) 600 MG tablet; Take 1 tablet (600 mg total) by mouth every 8 (eight) hours as needed.  Dispense: 30 tablet; Refill: 0  2. Encounter for smoking cessation counseling  - Varenicline Tartrate, Starter, (CHANTIX STARTING MONTH PAK) 0.5 MG X 11 & 1 MG X 42 TBPK; Take 0.5 mg by mouth daily.  Dispense: 53 each; Refill: 0   Follow up:  Follow up as needed

## 2021-11-27 NOTE — Progress Notes (Signed)
$'@Patient'g$  ID: Vanessa Butler, female    DOB: October 09, 1975, 46 y.o.   MRN: [id number]  Chief Complaint  Patient presents with   thick skin    Pt stated left foot/hand--thick skin-painful.    Referring provider: Vevelyn Francois, NP   HPI  Patient presents today for follow-up visit.  She states that she has seen dermatology and podiatry for issues with palmoplantar keratoderma.  She has required multiple surgeries in the past for this issue to her left hand and left foot.  The last surgeon that saw her suggested that she should quit smoking for 2 months before surgery.  Patient is not willing to do this.  She is willing to start Chantix to try to cut back smoking she would like their opinion on this. Denies f/c/s, n/v/d, hemoptysis, PND, leg swelling Denies chest pain or edema     Allergies  Allergen Reactions   Doxycycline Nausea Only    Immunization History  Administered Date(s) Administered   PFIZER(Purple Top)SARS-COV-2 Vaccination 04/21/2019, 05/16/2019   Pneumococcal Polysaccharide-23 02/27/2016   Tdap 02/27/2016    Past Medical History:  Diagnosis Date   Abnormal uterine bleeding (AUB)    GERD (gastroesophageal reflux disease)    Grieving    son murdered 47yr ago per pt on 10-14-2021   Insomnia 11/2018   Skin disorder    [facility] for [facility]atoderma   Vitamin B12 deficiency 12/2018   Vitamin D deficiency 12/2018    Tobacco History: Social History   Tobacco Use  Smoking Status Every Day   Types: Cigarettes  Smokeless Tobacco Never  Tobacco Comments   Smoked 1 ppd for last 4 years,prior to that  1/2  ppd x 28 yrs   Ready to quit: Not Answered Counseling given: Not Answered Tobacco comments: Smoked 1 ppd for last 4 years,prior to that  1/2  ppd x 28 yrs   Outpatient Encounter Medications as of 11/27/2021  Medication Sig   clobetasol ointment (TEMOVATE) 07.42% Apply 1 Application topically 2 (two) times daily.   ibuprofen (ADVIL) 600 MG  tablet Take 1 tablet (600 mg total) by mouth every 8 (eight) hours as needed.   Melatonin 10 MG CAPS Take 20 mg by mouth at bedtime. 2 of 20 mg gummy at hs   omeprazole (PRILOSEC) 40 MG capsule Take 1 capsule (40 mg total) by mouth in the morning and at bedtime. (Patient taking differently: Take 40 mg by mouth at bedtime.)   OVER THE COUNTER MEDICATION Pain reliever (acetaminophen 500 mg )  2 at hs   tacrolimus (PROTOPIC) 0.1 % ointment Apply topically 2 (two) times daily.   Varenicline Tartrate, Starter, (CHANTIX STARTING MONTH PAK) 0.5 MG X 11 & 1 MG X 42 TBPK Take 0.5 mg by mouth daily.   oxyCODONE (OXY IR/ROXICODONE) 5 MG immediate release tablet Take 1 tablet (5 mg total) by mouth every 6 (six) hours as needed for severe pain.   No facility-administered encounter medications on file as of 11/27/2021.     Review of Systems  Review of Systems  Constitutional: Negative.   HENT: Negative.    Cardiovascular: Negative.   Gastrointestinal: Negative.   Skin:        Thick skin to feet. Tender to touch.  Allergic/Immunologic: Negative.   Neurological: Negative.   Psychiatric/Behavioral: Negative.         Physical Exam  BP 131/80   Pulse 88   Ht 5' (1.524 m)   Wt 146 lb (  66.2 kg)   SpO2 96%   BMI 28.51 kg/m   Wt Readings from Last 5 Encounters:  11/27/21 146 lb (66.2 kg)  10/24/21 143 lb 11.2 oz (65.2 kg)  08/22/21 145 lb (65.8 kg)  05/10/21 150 lb (68 kg)  03/13/21 150 lb (68 kg)     Physical Exam Vitals and nursing note reviewed.  Constitutional:      General: She is not in acute distress.    Appearance: She is well-developed.  Cardiovascular:     Rate and Rhythm: Normal rate and regular rhythm.  Pulmonary:     Effort: Pulmonary effort is normal.     Breath sounds: Normal breath sounds.  Skin:    Comments: Thick discoloration to skin to left foot   Neurological:     Mental Status: She is alert and oriented to person, place, and time.      Lab  Results:  CBC    Component Value Date/Time   WBC 5.0 10/24/2021 1236   RBC 4.27 10/24/2021 1236   HGB 14.6 10/24/2021 1236   HGB 12.6 08/22/2021 1529   HCT 43.8 10/24/2021 1236   HCT 36.0 08/22/2021 1529   PLT 305 10/24/2021 1236   PLT 339 08/22/2021 1529   MCV 102.6 (H) 10/24/2021 1236   MCV 97 08/22/2021 1529   MCH 34.2 (H) 10/24/2021 1236   MCHC 33.3 10/24/2021 1236   RDW 12.9 10/24/2021 1236   RDW 12.7 08/22/2021 1529   LYMPHSABS 2.6 02/14/2021 1600   MONOABS 0.3 11/19/2017 1250   EOSABS 0.0 02/14/2021 1600   BASOSABS 0.0 02/14/2021 1600    BMET    Component Value Date/Time   NA 136 10/24/2021 1355   NA 140 02/14/2021 1600   K 4.1 10/24/2021 1355   CL 107 10/24/2021 1355   CO2 22 10/24/2021 1355   GLUCOSE 84 10/24/2021 1355   BUN 12 10/24/2021 1355   BUN 12 02/14/2021 1600   CREATININE 0.65 10/24/2021 1355   CREATININE 0.71 09/04/2016 1056   CALCIUM 8.8 (L) 10/24/2021 1355   GFRNONAA >60 10/24/2021 1355   GFRNONAA >89 09/04/2016 1056   GFRAA 101 04/11/2019 1443   GFRAA >89 09/04/2016 1056     Assessment & Plan:   Palmoplantar keratoderma - Ambulatory referral to Podiatry - ibuprofen (ADVIL) 600 MG tablet; Take 1 tablet (600 mg total) by mouth every 8 (eight) hours as needed.  Dispense: 30 tablet; Refill: 0  2. Encounter for smoking cessation counseling  - Varenicline Tartrate, Starter, (CHANTIX STARTING MONTH PAK) 0.5 MG X 11 & 1 MG X 42 TBPK; Take 0.5 mg by mouth daily.  Dispense: 53 each; Refill: 0   Follow up:  Follow up as needed     Fenton Foy, NP 11/27/2021

## 2021-11-29 ENCOUNTER — Telehealth (INDEPENDENT_AMBULATORY_CARE_PROVIDER_SITE_OTHER): Payer: Medicaid Other | Admitting: Family Medicine

## 2021-11-29 ENCOUNTER — Encounter: Payer: Self-pay | Admitting: Family Medicine

## 2021-11-29 DIAGNOSIS — Z09 Encounter for follow-up examination after completed treatment for conditions other than malignant neoplasm: Secondary | ICD-10-CM

## 2021-11-29 DIAGNOSIS — F17210 Nicotine dependence, cigarettes, uncomplicated: Secondary | ICD-10-CM

## 2021-11-29 DIAGNOSIS — N939 Abnormal uterine and vaginal bleeding, unspecified: Secondary | ICD-10-CM

## 2021-11-29 DIAGNOSIS — F17200 Nicotine dependence, unspecified, uncomplicated: Secondary | ICD-10-CM

## 2021-11-29 NOTE — Progress Notes (Signed)
Virtual Visit via Video Note  I connected with Vanessa Bible on 11/29/21 at 11:15 AM EST by a video enabled telemedicine application and verified that I am speaking with the correct person using two identifiers.   Vanessa Butler, CMA

## 2021-11-29 NOTE — Assessment & Plan Note (Signed)
Discussed smoking cessation. Has patches. Reviewed list of alternatives to smoking.

## 2021-11-29 NOTE — Assessment & Plan Note (Signed)
Has small fibroids--did not remove with surgery, because surgery to remove them would have been a hysterectomy. Patient did not desire this.

## 2021-11-29 NOTE — Progress Notes (Signed)
GYNECOLOGY VIRTUAL VISIT ENCOUNTER NOTE  Provider location: [facility] [place]   Patient location: Home  I connected with Vanessa Butler on 11/29/21 at 11:15 AM EST by MyChart Video Encounter and verified that I am speaking with the correct person using two identifiers.   I discussed the limitations, risks, security and privacy concerns of performing an evaluation and management service virtually and the availability of in person appointments. I also discussed with the patient that there may be a patient responsible charge related to this service. The patient expressed understanding and agreed to proceed.   History:  Vanessa Butler is a 46 y.o. [phone] female being evaluated today for postop check. She is s/p D & C with HTA and hysteroscopy. Reports no bleeding x 2 weeks. She denies any abnormal vaginal discharge, bleeding, pelvic pain or other concerns.       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   Vitamin B12 deficiency 12/2018   Vitamin D deficiency 12/2018   Past Surgical History:  Procedure Laterality Date   COLONOSCOPY  05/10/2021   DILATATION & CURETTAGE/HYSTEROSCOPY WITH MYOSURE N/A 09/21/2019   Procedure: DILATATION & CURETTAGE/HYSTEROSCOPY WITH MYOSURE;  Surgeon: DSloan Leiter MD;  Location[facility]  Service: Gynecology;  Laterality: N/A;   DILITATION & CURRETTAGE/HYSTROSCOPY WITH HYDROTHERMAL ABLATION N/A 10/24/2021   Procedure: DILATATION & CURETTAGE/HYSTEROSCOPY WITH HYDROTHERMAL ABLATION;  Surgeon: PDonnamae Jude MD;  [facility]  Service: Gynecology;  Laterality: N/A;   FOOT SURGERY Left 2008   HAND SURGERY Left 11/2020   to remove skin disorder keratoderma    [place] end[place]20[place]   [place] [facility]  6 mm x 4 mm removed from rectum, repeat lower endo [place] 1 year   TUBAL LIGATION     UPPER GASTROINTESTINAL ENDOSCOPY  05/10/2021   The following portions of the patient's history were reviewed and updated as appropriate: allergies, current medications, past family history, past medical history, past social history, past surgical history and problem list.   Health Maintenance:  Normal pap and negative HRHPV on 08/22/2021.  Normal mammogram on 09/11/2021.   Review of Systems:  Pertinent items noted in HPI and remainder of comprehensive ROS otherwise negative.  Physical Exam:   General:  Alert, oriented and cooperative. Patient appears to be in no acute distress.  Mental Status: Normal mood and affect. Normal behavior. Normal judgment and thought content.   Respiratory: Normal respiratory effort, no problems with respiration noted  Rest of physical exam deferred due to type of encounter  Labs and Imaging No results found for this or any previous visit (from the past 336 hour(s)). No results found.     Assessment and Plan:     Problem List Items Addressed This Visit       Unprioritized   Tobacco dependence    Discussed smoking cessation. Has patches. Reviewed list of alternatives to smoking.      Abnormal uterine bleeding (AUB)    Has small fibroids--did not remove with surgery, because surgery to remove them would have been a hysterectomy. Patient did not desire this.      Other Visit Diagnoses  Postop check    -  Primary   doing well, usual recovery reviewed. Path is benign.            I discussed the assessment and treatment plan with the patient. The patient was provided an opportunity to ask questions and all were answered. The patient agreed with the plan and demonstrated an understanding of the instructions.   The patient was advised to call back or seek an in-person evaluation/go to the ED if  the symptoms worsen or if the condition fails to improve as anticipated.  I provided 12 minutes of face-to-face time during this encounter.   Donnamae Jude, MD [facility], [place]

## 2021-12-03 ENCOUNTER — Telehealth: Payer: Self-pay | Admitting: Family Medicine

## 2021-12-03 NOTE — Telephone Encounter (Signed)
Patient called in saying she had surgery on her uterus back on 10/12 by Dr. 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 She is wanting to know if she should be working since she thinks her immune system is still trying to bounce back from the surgery.

## 2021-12-10 NOTE — Telephone Encounter (Signed)
Called pt at phone number on file. No answer. Left VM for pt to call back. Will attempt to call at later time.

## 2021-12-11 NOTE — Telephone Encounter (Signed)
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  I advised pt that she will be fine to continue to work as long as she follows proper protocol to protect her and her family.   Vanessa Butler  12/11/21

## 2022-01-18 ENCOUNTER — Ambulatory Visit (HOSPITAL_COMMUNITY)
Admission: EM | Admit: 2022-01-18 | Discharge: 2022-01-18 | Disposition: A | Discharge status: Home / Self Care | Payer: Medicaid Other

## 2022-01-18 ENCOUNTER — Encounter (HOSPITAL_COMMUNITY): Payer: Self-pay | Admitting: *Deleted

## 2022-01-18 ENCOUNTER — Other Ambulatory Visit: Payer: Self-pay

## 2022-01-18 DIAGNOSIS — U071 COVID-19: Secondary | ICD-10-CM | POA: Diagnosis not present

## 2022-01-18 NOTE — Discharge Instructions (Signed)
You have tested positive for Covid 19, you are contagious for 5 days at least, quarantine accordingly. Push fluids, alternate tylenol/ibuprofen as label directed or multi symptom cold med but not both d/t tylenol being in multi symptom med usually. Follow up with PCP. Call [facility] to reschedule appt.

## 2022-01-18 NOTE — ED Provider Notes (Signed)
[place]    CSN: [id number] Arrival date & time: 01/18/22  1147      History   Chief Complaint Chief Complaint  Patient presents with   positive home [place]  today   Headache [place]  today   Headache    HPI Vanessa Butler is a 47 y.o. female.   47 year old female, Vanessa Butler Butler, presents to emergency room with chief complaint of 2 positive home COVID test today also complaining of headache, dizzy at times, pt states she started with "cold symptoms" 2-3 days prior.  She is eating and drinking well, unknown illness contact, took multisymptom cold medicine over-the-counter for symptom management.  Patient recently stopped smoking.  She denies any chest pain shortness of breath or palpitations.  The history is provided by the patient. No language interpreter was used.    Past Medical History:  Diagnosis Date   Abnormal uterine bleeding (AUB)    GERD (gastroesophageal reflux disease)    Grieving    son murdered 71yr ago per pt on 10-14-2021   Insomnia 11/2018   Skin disorder    [facility] for [facility]oderm[facility]   Vitamin B12 deficiency 12/2018   Vitamin D deficiency 12/2018    Patient Active Problem List   Diagnosis Date Noted   Positive self-administered antigen test for COVID-19 01/18/2022   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   Abnormal uterine bleeding (AUB) 08/22/2021   Insomnia 12/13/2018   Hyperglycemia 12/13/2018   Gastroesophageal reflux disease without esophagitis 05/20/2018   Tobacco dependence 02/27/2016   Neuropathy 02/27/2016    Past Surgical History:  Procedure Laterality Date   COLONOSCOPY  05/10/2021   DILATATION & CURETTAGE/HYSTEROSCOPY WITH MYOSURE N/A 09/21/2019   Procedure: DILATATION & CURETTAGE/HYSTEROSCOPY WITH MYOSURE;  Surgeon: DSloan Leiter MD;  [facility]  Service: Gynecology;  Laterality: N/A;   DILITATION & CURRETTAGE/HYSTROSCOPY WITH HYDROTHERMAL ABLATION N/A 10/24/2021   Procedure: DILATATION &  CURETTAGE/HYSTEROSCOPY WITH HYDROTHERMAL ABLATION;  Surgeon: PDonnamae Jude MD;  [facility]  Service: Gynecology;  Laterality: N/A;   FOOT SURGERY Left 2008   HAND SURGERY Left 11/2020   to remove skin disorder keratoderma   [place]20[place]   [place]  6 mm x 4 mm removed from rectum, repeat lower endo [place] 1 year   TUBAL LIGATION     UPPER GASTROINTESTINAL ENDOSCOPY  05/10/2021    OB History     Gravida  3   Para  3   Term  2   Preterm  1   AB      Living  2      SAB      IAB      Ectopic      Multiple      Live Births  3            Home Medications    Prior to Admission medications   Medication Sig Start Date End Date Taking? Authorizing Provider  clobetasol ointment (TEMOVATE) 08.46% Apply 1 Application topically 2 (two) times daily. 11/21/21   McDonald, AStephan Minister DPM  ibuprofen (ADVIL) 600 MG tablet Take 1 tablet (600 mg total) by mouth every 8 (eight) hours as needed. 11/27/21   NFenton Foy NP  Melatonin 10 MG CAPS Take 20 mg by mouth at bedtime. 2 of 20 mg gummy at hs    [provider]  omeprazole (PRILOSEC) 40 MG capsule Take 1 capsule (40 mg total) by mouth  in the morning and at bedtime. Patient taking differently: Take 40 mg by mouth at bedtime. 03/13/21   Thornton Park, MD  OVER THE COUNTER MEDICATION Pain reliever (acetaminophen 500 mg )  2 at hs    [provider]  oxyCODONE (OXY IR/ROXICODONE) 5 MG immediate release tablet Take 1 tablet (5 mg total) by mouth every 6 (six) hours as needed for severe pain. 10/24/21   Donnamae Jude, MD  tacrolimus (PROTOPIC) 0.1 % ointment Apply topically 2 (two) times daily. 11/21/21   McDonald, Stephan Minister, DPM  Varenicline Tartrate, Starter, (CHANTIX STARTING MONTH PAK) 0.5 MG X 11 & 1 MG X 42 TBPK Take 0.5 mg by mouth daily. 11/27/21   Fenton Foy, NP    Family History Family History  Problem Relation Age of Onset    Diabetes Maternal Grandmother    Cancer Maternal Grandmother    Asthma Paternal Grandmother    Colon cancer Neg Hx    Pancreatic cancer Neg Hx    Stomach cancer Neg Hx    Liver cancer Neg Hx    Esophageal cancer Neg Hx     Social History Social History   Tobacco Use   Smoking status: Every Day    Types: Cigarettes   Smokeless tobacco: Never   Tobacco comments:    Smoked 1 ppd for last 4 years,prior to that  1/2  ppd x 28 yrs  Vaping Use   Vaping Use: Never used  Substance Use Topics   Alcohol use: Yes    Comment: occ   Drug use: No     Allergies   Doxycycline   Review of Systems Review of Systems  Constitutional:  Negative for fever.  HENT:  Positive for congestion.   Respiratory:  Positive for cough.   Cardiovascular:  Negative for chest pain and palpitations.  Gastrointestinal:  Negative for abdominal pain.  Neurological:  Positive for dizziness and headaches.  All other systems reviewed and are negative.    Physical Exam Triage Vital Signs ED Triage Vitals  Enc Vitals Group     BP 01/18/22 1214 139/75     Pulse Rate 01/18/22 1214 80     Resp 01/18/22 1214 18     Temp 01/18/22 1214 98.4 F (36.9 C)     Temp src --      SpO2 01/18/22 1214 97 %     Weight --      Height --      Head Circumference --      Peak Flow --      Pain Score 01/18/22 1209 0     Pain Loc --      Pain Edu? --      Excl. in [place]? --    No data found.  Updated Vital Signs BP 139/75   Pulse 80   Temp 98.4 F (36.9 C)   Resp 18   LMP  (LMP Unknown)   SpO2 97%   Visual Acuity Right Eye Distance:   Left Eye Distance:   Bilateral Distance:    Right Eye Near:   Left Eye Near:    Bilateral Near:     Physical Exam Vitals and nursing note reviewed.  Constitutional:      General: She is not in acute distress.    Appearance: She is well-developed and well-groomed.  HENT:     Head: Normocephalic.     Right Ear: Tympanic membrane is retracted.     Left Ear: Tympanic  membrane  is retracted.     Nose: Congestion present.     Mouth/Throat:     Lips: Pink.     Mouth: Mucous membranes are moist.     Pharynx: Oropharynx is clear.  Eyes:     General: Lids are normal.     Conjunctiva/sclera: Conjunctivae normal.     Pupils: Pupils are equal, round, and reactive to light.  Neck:     Trachea: Trachea normal. No tracheal deviation.  Cardiovascular:     Rate and Rhythm: Normal rate and regular rhythm.     Pulses: Normal pulses.     Heart sounds: Normal heart sounds. No murmur heard. Pulmonary:     Effort: Pulmonary effort is normal.     Breath sounds: Normal breath sounds and air entry.  Abdominal:     General: Bowel sounds are normal.     Palpations: Abdomen is soft.     Tenderness: There is no abdominal tenderness.  Musculoskeletal:        General: Normal range of motion.     Cervical back: Normal range of motion.  Lymphadenopathy:     Cervical: No cervical adenopathy.  Skin:    General: Skin is warm and dry.     Findings: No rash.  Neurological:     General: No focal deficit present.     Mental Status: She is alert and oriented to person, place, and time.     GCS: GCS eye subscore is 4. GCS verbal subscore is 5. GCS motor subscore is 6.     Cranial Nerves: Cranial nerves 2-12 are intact.     Sensory: Sensation is intact.     Motor: Motor function is intact.     Coordination: Coordination is intact.     Gait: Gait is intact.  Psychiatric:        Attention and Perception: Attention normal.        Mood and Affect: Mood normal.        Speech: Speech normal.        Behavior: Behavior normal. Behavior is cooperative.      UC Treatments / Results  Labs (all labs ordered are listed, but only abnormal results are displayed) Labs Reviewed - No data to display  EKG   Radiology No results found.  Procedures Procedures (including critical care time)  Medications Ordered in UC Medications - No data to display  Initial Impression /  Assessment and Plan / UC Course  I have reviewed the triage vital signs and the nursing notes.  Pertinent labs & imaging results that were available during my care of the patient were reviewed by me and considered in my medical decision making (see chart for details).     Ddx: Covid, Viral URI,Allergies Final Clinical Impressions(s) / UC Diagnoses   Final diagnoses:  Positive self-administered antigen test for COVID-19     Discharge Instructions      You have tested positive for Covid 19, you are contagious for 5 days at least, quarantine accordingly. Push fluids, alternate tylenol/ibuprofen as label directed or multi symptom cold med but not both d/t tylenol being in multi symptom med usually. Follow up with PCP. Call [facility] to reschedule appt.    ED Prescriptions   None    PDMP not reviewed this encounter.   Tori Milks, NP 23/76/28 1242

## 2022-01-18 NOTE — ED Triage Notes (Signed)
PT reports having 2 positive COVID tests today. Pt also reports feeling dizzy.

## 2022-04-07 ENCOUNTER — Other Ambulatory Visit: Payer: Self-pay | Admitting: Gastroenterology

## 2022-04-15 ENCOUNTER — Telehealth: Payer: Self-pay | Admitting: Gastroenterology

## 2022-04-15 ENCOUNTER — Other Ambulatory Visit: Payer: Self-pay | Admitting: Gastroenterology

## 2022-04-15 MED ORDER — OMEPRAZOLE 40 MG PO CPDR: DELAYED_RELEASE_CAPSULE | ORAL | 0 refills | Status: DC

## 2022-04-15 NOTE — Telephone Encounter (Signed)
Script sent to pharmacy.

## 2022-04-15 NOTE — Telephone Encounter (Signed)
Inbound call from patient, requesting refill for omeprazole sent to [facility] on [address]

## 2022-07-10 ENCOUNTER — Other Ambulatory Visit: Payer: Self-pay | Admitting: Nurse Practitioner

## 2022-07-10 ENCOUNTER — Other Ambulatory Visit: Payer: Self-pay | Admitting: Podiatry

## 2022-07-10 DIAGNOSIS — Z716 Tobacco abuse counseling: Secondary | ICD-10-CM

## 2022-07-11 ENCOUNTER — Other Ambulatory Visit: Payer: Self-pay | Admitting: Gastroenterology

## 2022-07-11 NOTE — Telephone Encounter (Signed)
Please advise in Tonya absence. 

## 2022-11-08 IMAGING — DX DG NECK SOFT TISSUE
2 series · 2 of 2 positions shown · non-contrast
Comparison: None.

CLINICAL DATA: Dysphagia

EXAM:
NECK SOFT TISSUES - 1+ VIEW

[neck lat]
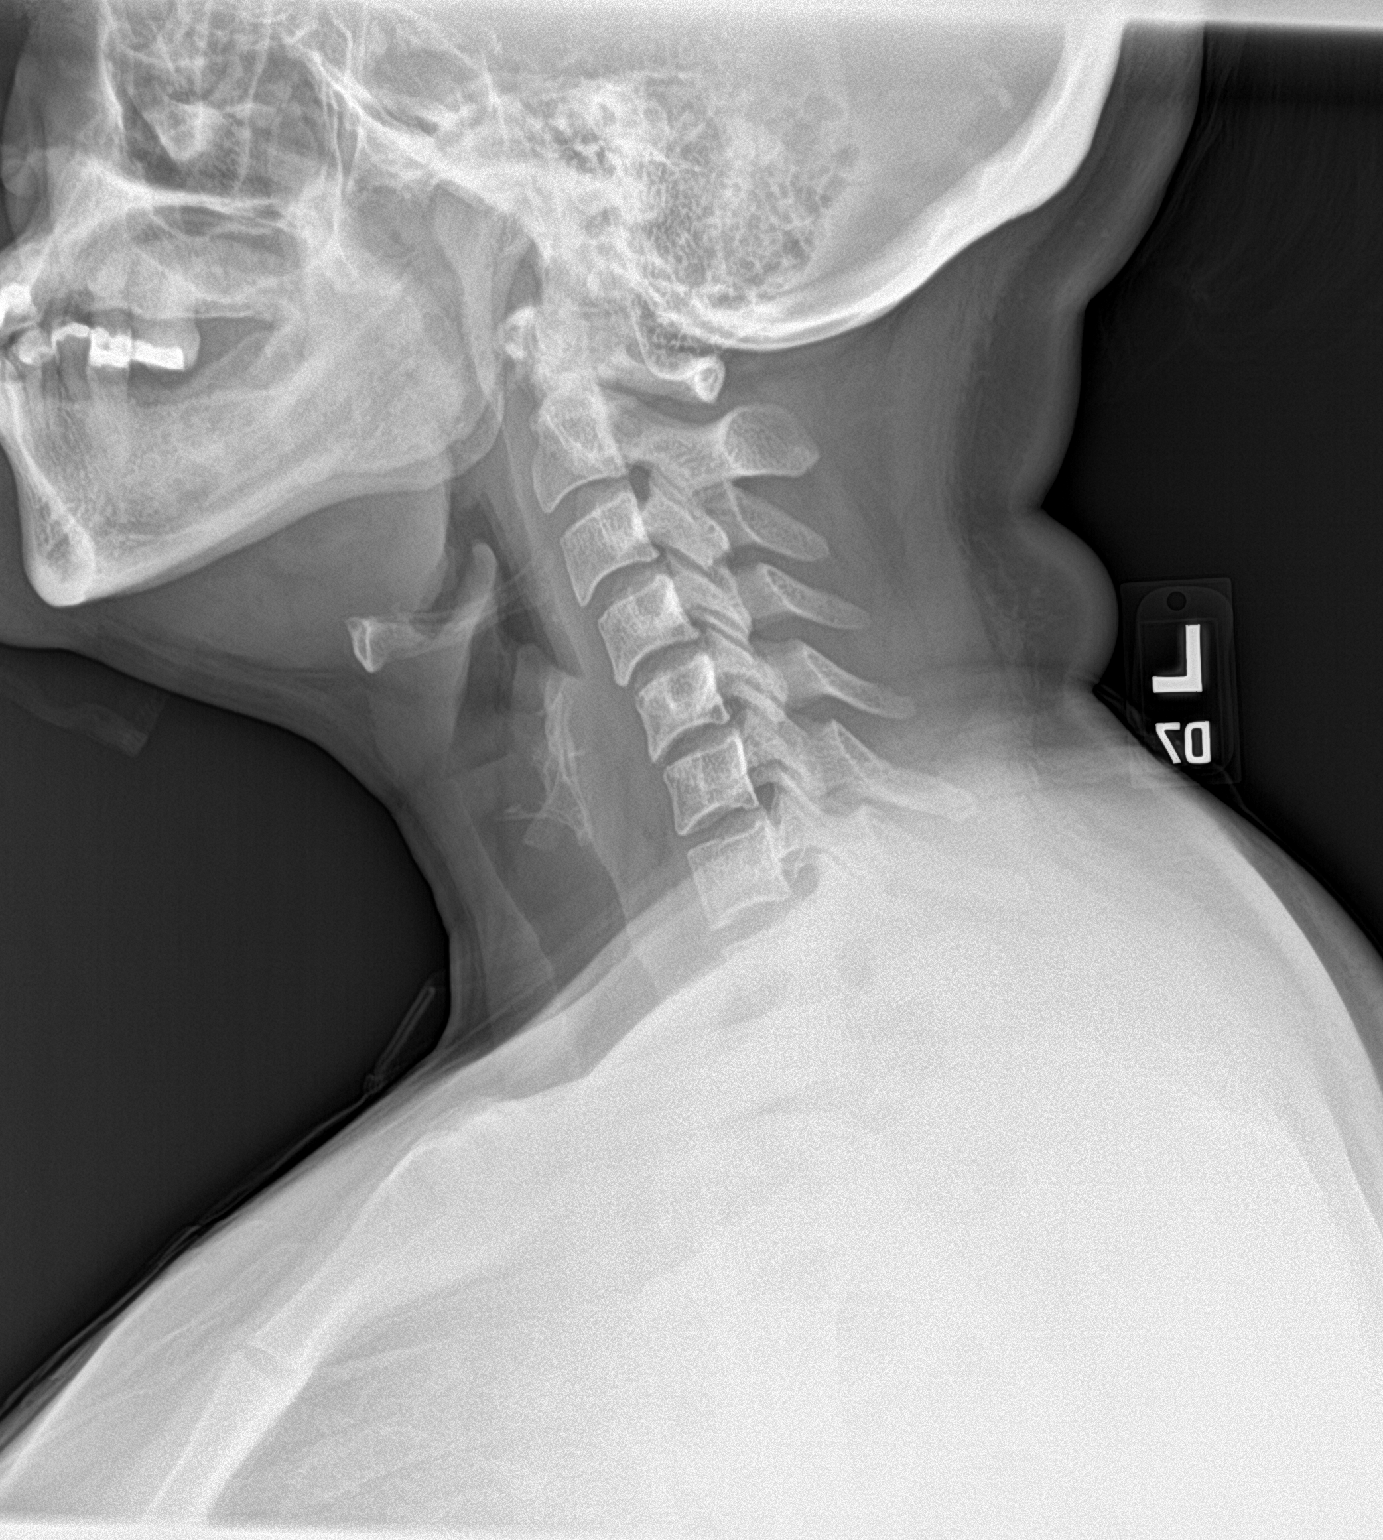

[neck ap]
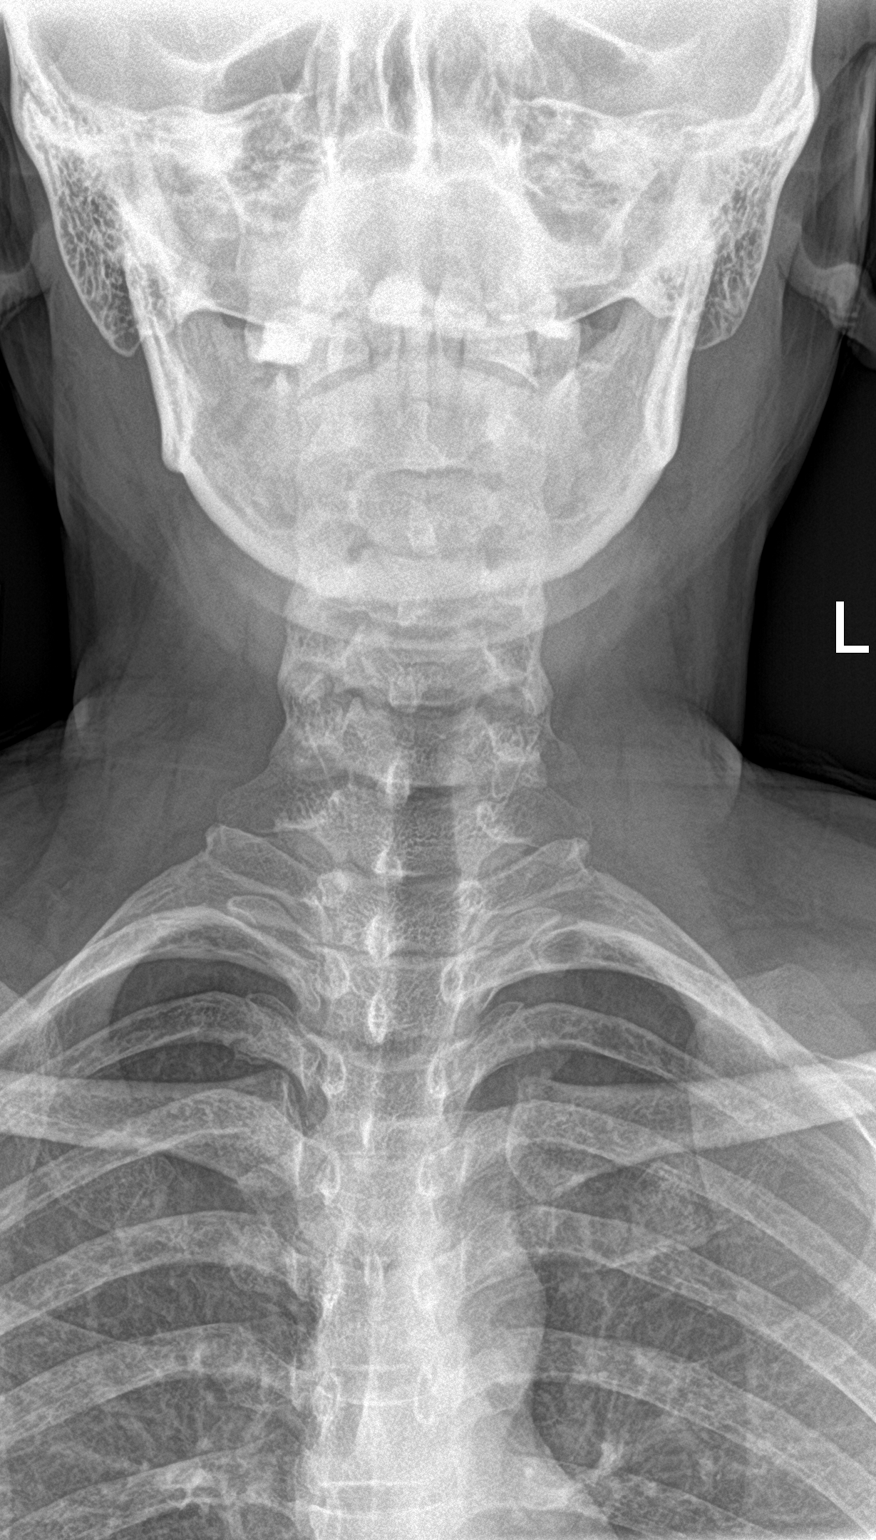

[2 of 2 positions shown; findings below may reference images not displayed]

FINDINGS: Frontal and lateral views of the soft tissues of the neck are
obtained. Airways widely patent. Epiglottis appears unremarkable.
Prevertebral and retropharyngeal soft tissues are unremarkable. No
radiopaque foreign bodies. No acute bony abnormalities. Lung apices
are clear.
IMPRESSION: 1. Unremarkable soft tissues of the neck.

## 2022-11-08 IMAGING — DX DG CHEST 2V
2 series · 2 of 2 positions shown · non-contrast
Comparison: 09/04/2016

CLINICAL DATA: Dysphagia, difficulty swallowing

EXAM:
CHEST - 2 VIEW

[chest pa]
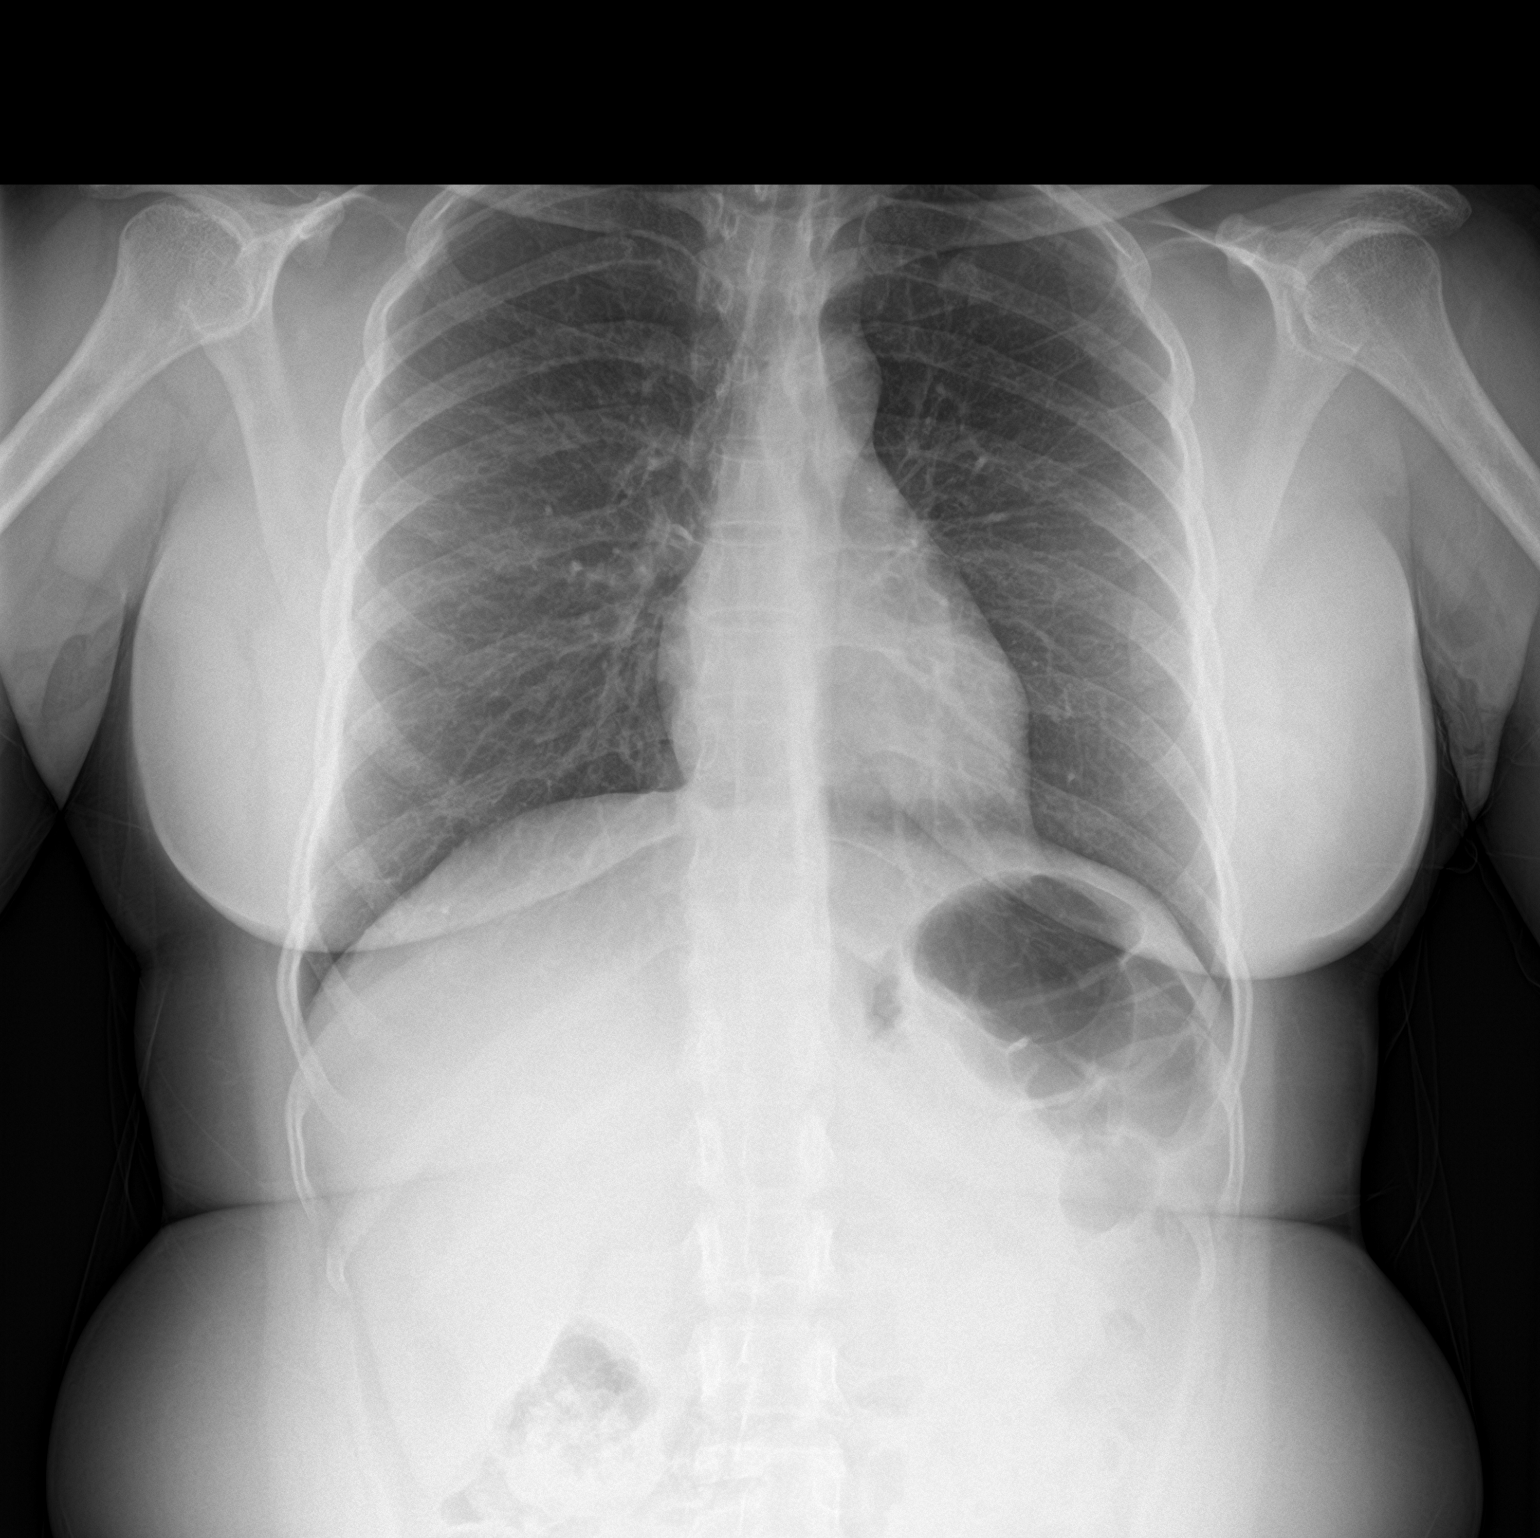

[chest lat]
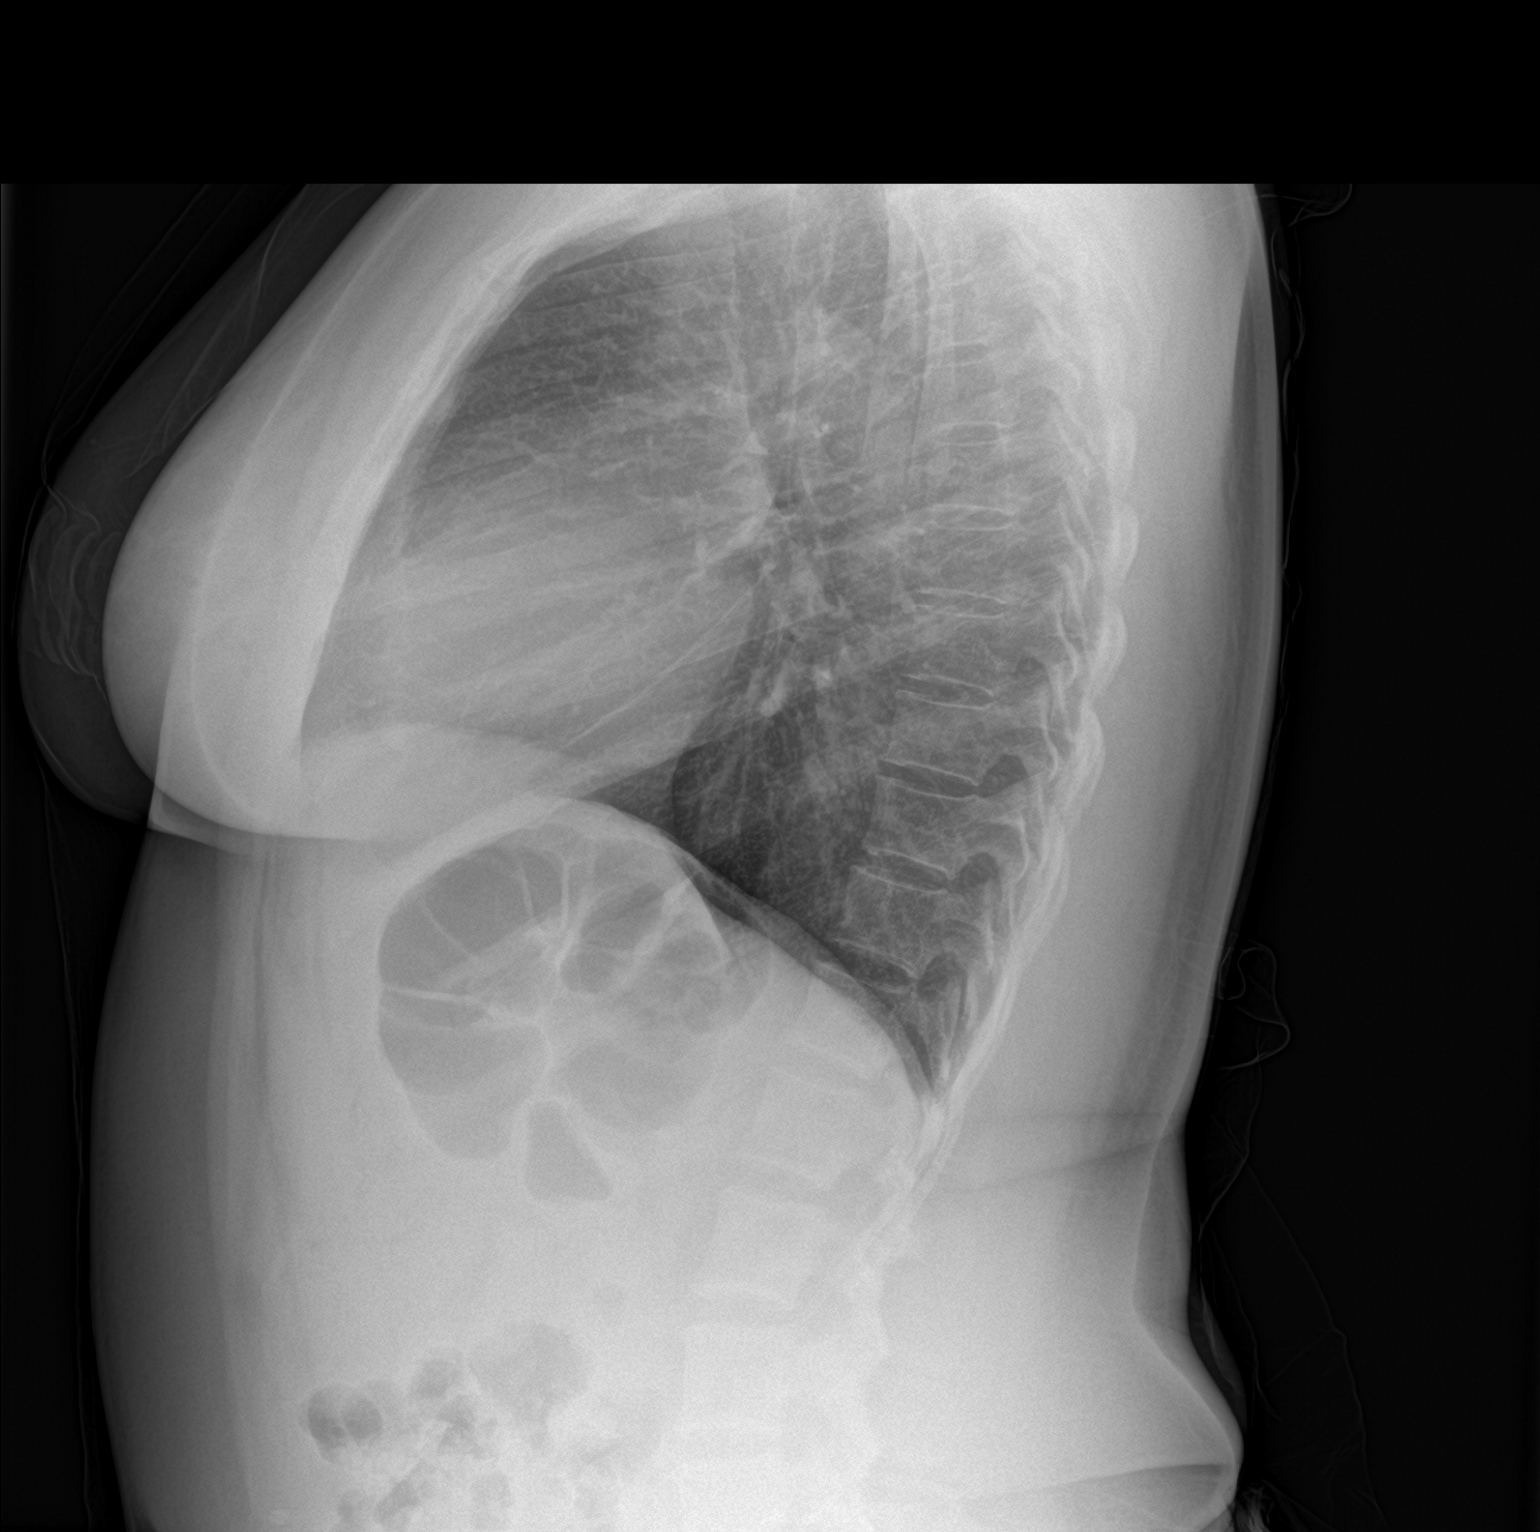

[2 of 2 positions shown; findings below may reference images not displayed]

FINDINGS: Frontal and lateral views of the chest demonstrate an unremarkable
cardiac silhouette. No airspace disease, effusion, or pneumothorax.
There are no acute bony abnormalities.
IMPRESSION: 1. No acute intrathoracic process.

## 2023-01-26 ENCOUNTER — Ambulatory Visit: Payer: Self-pay | Admitting: Nurse Practitioner

## 2023-01-26 NOTE — Telephone Encounter (Signed)
 Copied from CRM [id number]. Topic: Clinical - Red Word Triage >> Jan 26, 2023  2:41 PM Carmell SAUNDERS wrote: Red Word that prompted transfer to Nurse Triage: Numbness in right hand, wrist hurts and pops. Possible carpal tunnel. Looking to schedule an appointment for this and for a complete physical..   Chief Complaint: right hand numbness Symptoms: R hand numbness, right wrist burns and pops when trying to grab things, occasional dizziness, occasional headaches Pertinent Negatives: Patient denies visual changes Disposition: [] ED /[] Urgent Care (no appt availability in office) / [x] Appointment(In office/virtual)/ []  [facility]/ [] Home Care/ [] Refused Recommended Disposition /[] [facility]/ []  Follow-up with PCP  Additional Notes: Patient stated she has chronic hand numbness for the past year and it worsened in the last 4-5 months. She has also been having intermittent dizziness for the past year. Patient has been having headaches on and off for the past 2-3 weeks. Appointment scheduled for 1/16   Reason for Disposition  [1] Numbness or tingling in one or both hands AND [2] is a chronic symptom (recurrent or ongoing AND present > 4 weeks)  Answer Assessment - Initial Assessment Questions 1. SYMPTOM: What is the main symptom you are concerned about? (e.g., weakness, numbness)      Numbness right hand, wrist burns and pops when trying to grab things, little bit weakness in right arm  2. ONSET: When did this start? (minutes, hours, days; while sleeping)     On and off for one year, worsened in the last 4-5 months.   3. PATTERN Does this come and go, or has it been constant since it started?  Is it present now?     The wrist burning and popping comes and goes. Numbness comes and goes  4. NEUROLOGIC SYMPTOMS: Have you had any of the following symptoms: headache, dizziness, vision loss, double vision, changes in speech, unsteady on your feet?     Headaches for 2-3 weeks  (not right now)  5. OTHER SYMPTOMS: Do you have any other symptoms?     A little dizziness when bending down sometimes  Protocols used: Neurologic Deficit-A-AH

## 2023-01-29 ENCOUNTER — Encounter: Payer: Self-pay | Admitting: Nurse Practitioner

## 2023-01-29 ENCOUNTER — Ambulatory Visit (INDEPENDENT_AMBULATORY_CARE_PROVIDER_SITE_OTHER): Payer: MEDICAID | Admitting: Nurse Practitioner

## 2023-01-29 VITALS — BP 137/70 | HR 76 | Temp 97.2°F | Wt 139.4 lb

## 2023-01-29 DIAGNOSIS — E538 Deficiency of other specified B group vitamins: Secondary | ICD-10-CM | POA: Diagnosis not present

## 2023-01-29 DIAGNOSIS — R200 Anesthesia of skin: Secondary | ICD-10-CM

## 2023-01-29 DIAGNOSIS — K219 Gastro-esophageal reflux disease without esophagitis: Secondary | ICD-10-CM

## 2023-01-29 DIAGNOSIS — R202 Paresthesia of skin: Secondary | ICD-10-CM

## 2023-01-29 DIAGNOSIS — G43809 Other migraine, not intractable, without status migrainosus: Secondary | ICD-10-CM | POA: Diagnosis not present

## 2023-01-29 DIAGNOSIS — E559 Vitamin D deficiency, unspecified: Secondary | ICD-10-CM

## 2023-01-29 MED ORDER — TIZANIDINE HCL 4 MG PO TABS
4.0000 mg | ORAL_TABLET | Freq: Four times a day (QID) | ORAL | 0 refills | Status: DC | PRN
Start: 2023-01-29 — End: 2023-03-19

## 2023-01-29 MED ORDER — PREDNISONE 10 MG PO TABS
ORAL_TABLET | ORAL | 0 refills | Status: DC
Start: 2023-01-29 — End: 2023-03-08

## 2023-01-29 MED ORDER — SUMATRIPTAN SUCCINATE 25 MG PO TABS
25.0000 mg | ORAL_TABLET | ORAL | 0 refills | Status: DC | PRN
Start: 2023-01-29 — End: 2023-03-19

## 2023-01-29 MED ORDER — OMEPRAZOLE 40 MG PO CPDR: DELAYED_RELEASE_CAPSULE | ORAL | 0 refills | Status: DC

## 2023-01-29 NOTE — Patient Instructions (Signed)
1. Gastroesophageal reflux disease without esophagitis (Primary)  - omeprazole (PRILOSEC) 40 MG capsule; TAKE 1 CAPSULE(40 MG) BY MOUTH IN THE MORNING AND AT BEDTIME  Dispense: 60 capsule; Refill: 0 - CBC - Comprehensive metabolic panel  2. Numbness and tingling in right hand  - Ambulatory referral to Neurology - predniSONE (DELTASONE) 10 MG tablet; Take 4 tabs for 2 days, then 3 tabs for 2 days, then 2 tabs for 2 days, then 1 tab for 2 days, then stop  Dispense: 20 tablet; Refill: 0 - tiZANidine (ZANAFLEX) 4 MG tablet; Take 1 tablet (4 mg total) by mouth every 6 (six) hours as needed for muscle spasms.  Dispense: 30 tablet; Refill: 0 - CBC - Comprehensive metabolic panel  3. Other migraine without status migrainosus, not intractable  - SUMAtriptan (IMITREX) 25 MG tablet; Take 1 tablet (25 mg total) by mouth every 2 (two) hours as needed for migraine. May repeat in 2 hours if headache persists or recurs.  Dispense: 10 tablet; Refill: 0  4. Vitamin B12 deficiency  - Vitamin B12  5. Vitamin D deficiency  - Vitamin D, 25-hydroxy  Follow up:  Follow up in 3 months

## 2023-01-29 NOTE — Progress Notes (Signed)
Subjective   Patient ID: Vanessa Butler, female    DOB: [birth year], 48 y.o.   MRN: [id number]  Chief Complaint  Patient presents with   Hand Pain   Headache   Back Pain    Referring provider: Ivonne Andrew, NP  Vanessa Butler is a 48 y.o. female with Past Medical History: No date: Abnormal uterine bleeding (AUB) No date: GERD (gastroesophageal reflux disease) No date: Grieving     Comment:  son murdered 19yrs ago per pt on 10-14-2021 11/2018: Insomnia No date: Skin disorder     Comment:  seen at [facility] for keratoderma 12/2018: Vitamin B12 deficiency 12/2018: Vitamin D deficiency   HPI  Patient presents today for an acute visit.  She states that she has been having headaches for the past few weeks.  She states that she does have sensitivity to light with these headaches.  She also complains of upper back pain and right wrist and hand numbness.  We will trial Imitrex for headaches and muscle relaxer and steroids for back pain and wrist numbness.  We will place referral to neurology for further evaluation.  Patient does work long hours and does have to do a lot of lifting and repetitive hand motions. Denies f/c/s, n/v/d, hemoptysis, PND, leg swelling Denies chest pain or edema      Allergies  Allergen Reactions   Doxycycline Nausea Only and Rash    Immunization History  Administered Date(s) Administered   PFIZER(Purple Top)SARS-COV-2 Vaccination 04/21/2019, 05/16/2019   Pneumococcal Polysaccharide-23 02/27/2016   Tdap 02/27/2016    Tobacco History: Social History   Tobacco Use  Smoking Status Every Day   Types: Cigarettes  Smokeless Tobacco Never  Tobacco Comments   Smoked 1 ppd for last 4 years,prior to that  1/2  ppd x 28 yrs   Ready to quit: Yes Counseling given: Yes Tobacco comments: Smoked 1 ppd for last 4 years,prior to that  1/2  ppd x 28 yrs   Outpatient Encounter Medications as of 01/29/2023  Medication Sig   Melatonin 10 MG  CAPS Take 20 mg by mouth at bedtime. 2 of 20 mg gummy at hs   OVER THE COUNTER MEDICATION Pain reliever (acetaminophen 500 mg )  2 at hs   predniSONE (DELTASONE) 10 MG tablet Take 4 tabs for 2 days, then 3 tabs for 2 days, then 2 tabs for 2 days, then 1 tab for 2 days, then stop   SUMAtriptan (IMITREX) 25 MG tablet Take 1 tablet (25 mg total) by mouth every 2 (two) hours as needed for migraine. May repeat in 2 hours if headache persists or recurs.   tiZANidine (ZANAFLEX) 4 MG tablet Take 1 tablet (4 mg total) by mouth every 6 (six) hours as needed for muscle spasms.   [DISCONTINUED] omeprazole (PRILOSEC) 40 MG capsule TAKE 1 CAPSULE(40 MG) BY MOUTH IN THE MORNING AND AT BEDTIME   clobetasol ointment (TEMOVATE) 0.05 % Apply 1 Application topically 2 (two) times daily. (Patient not taking: Reported on 01/29/2023)   ibuprofen (ADVIL) 600 MG tablet Take 1 tablet (600 mg total) by mouth every 8 (eight) hours as needed. (Patient not taking: Reported on 01/29/2023)   omeprazole (PRILOSEC) 40 MG capsule TAKE 1 CAPSULE(40 MG) BY MOUTH IN THE MORNING AND AT BEDTIME   tacrolimus (PROTOPIC) 0.1 % ointment APPLY TOPICALLY TO THE AFFECTED AREA TWICE DAILY (Patient not taking: Reported on 01/29/2023)   [DISCONTINUED] oxyCODONE (OXY IR/ROXICODONE) 5 MG immediate release tablet Take 1 tablet (  5 mg total) by mouth every 6 (six) hours as needed for severe pain.   [DISCONTINUED] Varenicline Tartrate, Starter, (CHANTIX STARTING MONTH PAK) 0.5 MG X 11 & 1 MG X 42 TBPK Take 0.5 mg by mouth daily. (Patient not taking: Reported on 01/29/2023)   No facility-administered encounter medications on file as of 01/29/2023.    Review of Systems  Review of Systems  Constitutional: Negative.   HENT: Negative.    Cardiovascular: Negative.   Gastrointestinal: Negative.   Musculoskeletal:  Positive for back pain.  Allergic/Immunologic: Negative.   Neurological:  Positive for numbness (right wrist) and headaches.   Psychiatric/Behavioral: Negative.       Objective:   BP 137/70   Pulse 76   Temp (!) 97.2 F (36.2 C)   Wt 139 lb 6.4 oz (63.2 kg)   SpO2 100%   BMI 27.22 kg/m   Wt Readings from Last 5 Encounters:  01/29/23 139 lb 6.4 oz (63.2 kg)  11/27/21 146 lb (66.2 kg)  10/24/21 143 lb 11.2 oz (65.2 kg)  08/22/21 145 lb (65.8 kg)  05/10/21 150 lb (68 kg)     Physical Exam Vitals and nursing note reviewed.  Constitutional:      General: She is not in acute distress.    Appearance: She is well-developed.  Cardiovascular:     Rate and Rhythm: Regular rhythm.  Pulmonary:     Effort: Pulmonary effort is normal.     Breath sounds: Normal breath sounds.  Musculoskeletal:     Right wrist: No swelling or tenderness. Normal range of motion.     Cervical back: Tenderness present. Decreased range of motion.  Neurological:     Mental Status: She is alert and oriented to person, place, and time.       Assessment & Plan:   Gastroesophageal reflux disease without esophagitis -     Omeprazole; TAKE 1 CAPSULE(40 MG) BY MOUTH IN THE MORNING AND AT BEDTIME  Dispense: 60 capsule; Refill: 0 -     CBC -     Comprehensive metabolic panel  Numbness and tingling in right hand -     Ambulatory referral to Neurology -     predniSONE; Take 4 tabs for 2 days, then 3 tabs for 2 days, then 2 tabs for 2 days, then 1 tab for 2 days, then stop  Dispense: 20 tablet; Refill: 0 -     tiZANidine HCl; Take 1 tablet (4 mg total) by mouth every 6 (six) hours as needed for muscle spasms.  Dispense: 30 tablet; Refill: 0 -     CBC -     Comprehensive metabolic panel  Other migraine without status migrainosus, not intractable -     SUMAtriptan Succinate; Take 1 tablet (25 mg total) by mouth every 2 (two) hours as needed for migraine. May repeat in 2 hours if headache persists or recurs.  Dispense: 10 tablet; Refill: 0  Vitamin B12 deficiency -     Vitamin B12  Vitamin D deficiency -     VITAMIN D 25 Hydroxy  (Vit-D Deficiency, Fractures)     Return in about 3 months (around 04/29/2023).   Ivonne Andrew, NP 01/29/2023

## 2023-01-30 LAB — CBC
Hematocrit: 37.6 % (ref 34.0–46.6)
Hemoglobin: 12.6 g/dL (ref 11.1–15.9)
MCH: 33.2 pg — ABNORMAL HIGH (ref 26.6–33.0)
MCHC: 33.5 g/dL (ref 31.5–35.7)
MCV: 99 fL — ABNORMAL HIGH (ref 79–97)
Platelets: 323 10*3/uL (ref 150–450)
RBC: 3.79 x10E6/uL (ref 3.77–5.28)
RDW: 12.3 % (ref 11.7–15.4)
WBC: 4.1 10*3/uL (ref 3.4–10.8)

## 2023-01-30 LAB — COMPREHENSIVE METABOLIC PANEL
ALT: 12 [IU]/L (ref 0–32)
AST: 13 [IU]/L (ref 0–40)
Albumin: 4.3 g/dL (ref 3.9–4.9)
Alkaline Phosphatase: 74 [IU]/L (ref 44–121)
BUN/Creatinine Ratio: 19 (ref 9–23)
BUN: 14 mg/dL (ref 6–24)
Bilirubin Total: 0.4 mg/dL (ref 0.0–1.2)
CO2: 19 mmol/L — ABNORMAL LOW (ref 20–29)
Calcium: 9.1 mg/dL (ref 8.7–10.2)
Chloride: 108 mmol/L — ABNORMAL HIGH (ref 96–106)
Creatinine, Ser: 0.72 mg/dL (ref 0.57–1.00)
Globulin, Total: 2.5 g/dL (ref 1.5–4.5)
Glucose: 88 mg/dL (ref 70–99)
Potassium: 4.6 mmol/L (ref 3.5–5.2)
Sodium: 144 mmol/L (ref 134–144)
Total Protein: 6.8 g/dL (ref 6.0–8.5)
eGFR: 103 mL/min/{1.73_m2} (ref >59)

## 2023-01-30 LAB — VITAMIN B12: Vitamin B-12: 262 pg/mL (ref 232–1245)

## 2023-01-30 LAB — VITAMIN D 25 HYDROXY (VIT D DEFICIENCY, FRACTURES): Vit D, 25-Hydroxy: 12.3 ng/mL — ABNORMAL LOW (ref 30.0–100.0)

## 2023-02-02 ENCOUNTER — Encounter: Payer: Self-pay | Admitting: Neurology

## 2023-02-02 ENCOUNTER — Other Ambulatory Visit: Payer: Self-pay | Admitting: Nurse Practitioner

## 2023-02-02 MED ORDER — VITAMIN D (ERGOCALCIFEROL) 1.25 MG (50000 UNIT) PO CAPS: 50000.0000 [IU] | ORAL_CAPSULE | ORAL | 2 refills | Status: DC

## 2023-02-25 ENCOUNTER — Other Ambulatory Visit: Payer: Self-pay | Admitting: Nurse Practitioner

## 2023-02-25 DIAGNOSIS — K219 Gastro-esophageal reflux disease without esophagitis: Secondary | ICD-10-CM

## 2023-03-08 ENCOUNTER — Encounter: Payer: Self-pay | Admitting: *Deleted

## 2023-03-08 ENCOUNTER — Other Ambulatory Visit: Payer: Self-pay

## 2023-03-08 ENCOUNTER — Ambulatory Visit
Admission: EM | Admit: 2023-03-08 | Discharge: 2023-03-08 | Disposition: A | Discharge status: Home / Self Care | Payer: MEDICAID | Attending: Emergency Medicine | Admitting: Emergency Medicine

## 2023-03-08 DIAGNOSIS — M7072 Other bursitis of hip, left hip: Secondary | ICD-10-CM | POA: Diagnosis not present

## 2023-03-08 MED ORDER — PREDNISONE 50 MG PO TABS: ORAL_TABLET | ORAL | 0 refills | Status: DC

## 2023-03-08 NOTE — ED Triage Notes (Signed)
 Left hip pain, progressively getting worse over the last 3-4 days. Denies fall/injury. States she walks a lot at work. Took zanaflex yesterday without relief

## 2023-03-08 NOTE — ED Provider Notes (Signed)
 [name]    CSN: [id number] Arrival date & time: 03/08/23  0816      History   Chief Complaint Chief Complaint  Patient presents with   Hip Pain    HPI Vanessa Butler is a 48 y.o. female.   Pt complains of pain in her left hip pain for the 4 days.  Pt reports she lifts heavy boxes at work.  Pain with movement.    The history is provided by the patient. No language interpreter was used.  Hip Pain This is a new problem. The problem occurs constantly. The problem has not changed since onset.Nothing aggravates the symptoms. Nothing relieves the symptoms. She has tried nothing for the symptoms.    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   Abnormal uterine bleeding (AUB) 08/22/2021   Insomnia 12/13/2018   Hyperglycemia 12/13/2018   Gastroesophageal reflux disease without esophagitis 05/20/2018   Tobacco dependence 02/27/2016   Neuropathy 02/27/2016    Past Surgical History:  Procedure Laterality Date   COLONOSCOPY  05/10/2021   DILATATION & CURETTAGE/HYSTEROSCOPY WITH MYOSURE N/A 09/21/2019   Procedure: DILATATION & CURETTAGE/HYSTEROSCOPY WITH MYOSURE;  Surgeon: Conan Bowens, MD;  Location: [facility];  Service: Gynecology;  Laterality: N/A;   DILITATION & CURRETTAGE/HYSTROSCOPY WITH HYDROTHERMAL ABLATION N/A 10/24/2021   Procedure: DILATATION & CURETTAGE/HYSTEROSCOPY WITH HYDROTHERMAL ABLATION;  Surgeon: Reva Bores, MD;  Location: [facility];  Service: Gynecology;  Laterality: N/A;    FOOT SURGERY Left 2008   HAND SURGERY Left 11/2020   to remove skin disorder keratoderma   lower endo [place]  07/12/2021   at [facility], benign neuroendocrine tumor  6 mm x 4 mm removed from rectum, repeat lower endo [place] in 1 year   TUBAL LIGATION     UPPER GASTROINTESTINAL ENDOSCOPY  05/10/2021    OB History     Gravida  3   Para  3   Term  2   Preterm  1   AB      Living  2      SAB      IAB      Ectopic      Multiple      Live Births  3            Home Medications    Prior to Admission medications   Medication Sig Start Date End Date Taking? Authorizing Provider  Melatonin 10 MG CAPS Take 20 mg by mouth at bedtime. 2 of 20 mg gummy at hs   Yes [provider]  omeprazole (PRILOSEC) 40 MG capsule TAKE 1 CAPSULE(40 MG) BY MOUTH IN THE MORNING AND AT BEDTIME 02/26/23  Yes Ivonne Andrew, NP  OVER THE COUNTER MEDICATION Pain reliever (acetaminophen 500 mg )  2 at hs   Yes [provider]  SUMAtriptan (IMITREX) 25 MG tablet Take 1 tablet (25 mg total) by mouth every 2 (two) hours as needed for migraine. May repeat in 2 hours if headache persists  or recurs. 01/29/23  Yes Ivonne Andrew, NP  tiZANidine (ZANAFLEX) 4 MG tablet Take 1 tablet (4 mg total) by mouth every 6 (six) hours as needed for muscle spasms. 01/29/23  Yes Ivonne Andrew, NP  Vitamin D, Ergocalciferol, (DRISDOL) 1.25 MG (50000 UNIT) CAPS capsule Take 1 capsule (50,000 Units total) by mouth every 7 (seven) days. 02/02/23  Yes Ivonne Andrew, NP  clobetasol ointment (TEMOVATE) 0.05 % Apply 1 Application topically 2 (two) times daily. Patient not taking: Reported on 01/29/2023 11/21/21   Edwin Cap, DPM  ibuprofen (ADVIL) 600 MG tablet Take 1 tablet (600 mg total) by mouth every 8 (eight) hours as needed. Patient not taking: Reported on 01/29/2023 11/27/21   Ivonne Andrew, NP  predniSONE (DELTASONE) 10 MG tablet Take 4 tabs for 2 days, then 3 tabs for 2 days, then 2 tabs for 2 days,  then 1 tab for 2 days, then stop Patient not taking: Reported on 03/08/2023 01/29/23   Ivonne Andrew, NP  tacrolimus (PROTOPIC) 0.1 % ointment APPLY TOPICALLY TO THE AFFECTED AREA TWICE DAILY Patient not taking: Reported on 01/29/2023 07/14/22   Louann Sjogren, DPM    Family History Family History  Problem Relation Age of Onset   Diabetes Maternal Grandmother    Cancer Maternal Grandmother    Asthma Paternal Grandmother    Colon cancer Neg Hx    Pancreatic cancer Neg Hx    Stomach cancer Neg Hx    Liver cancer Neg Hx    Esophageal cancer Neg Hx     Social History Social History   Tobacco Use   Smoking status: Every Day    Types: Cigarettes   Smokeless tobacco: Never   Tobacco comments:    Smoked 1 ppd for last 4 years,prior to that  1/2  ppd x 28 yrs  Vaping Use   Vaping status: Never Used  Substance Use Topics   Alcohol use: Yes    Comment: occ   Drug use: No     Allergies   Doxycycline   Review of Systems Review of Systems  Musculoskeletal:  Positive for arthralgias and back pain.  All other systems reviewed and are negative.    Physical Exam Triage Vital Signs ED Triage Vitals  Encounter Vitals Group     BP 03/08/23 0937 128/63     Systolic BP Percentile --      Diastolic BP Percentile --      Pulse Rate 03/08/23 0937 72     Resp 03/08/23 0937 16     Temp 03/08/23 0937 98.3 F (36.8 C)     Temp Source 03/08/23 0937 Oral     SpO2 03/08/23 0937 98 %     Weight --      Height --      Head Circumference --      Peak Flow --      Pain Score 03/08/23 0935 6     Pain Loc --      Pain Education --      Exclude from Growth Chart --    No data found.  Updated Vital Signs BP 128/63 (BP Location: Left Arm)   Pulse 72   Temp 98.3 F (36.8 C) (Oral)   Resp 16   SpO2 98%   Visual Acuity Right Eye Distance:   Left Eye Distance:   Bilateral Distance:    Right Eye Near:   Left Eye Near:    Bilateral Near:  Physical Exam Vitals and nursing  note reviewed.  Constitutional:      Appearance: She is well-developed.  HENT:     Head: Normocephalic.  Cardiovascular:     Rate and Rhythm: Normal rate.  Pulmonary:     Effort: Pulmonary effort is normal.  Abdominal:     General: There is no distension.  Musculoskeletal:        General: Swelling and tenderness present.     Cervical back: Normal range of motion.     Comments: Tender left hip bursa, pain with movements,  nv and ns intact   Skin:    General: Skin is warm.  Neurological:     General: No focal deficit present.     Mental Status: She is alert and oriented to person, place, and time.  Psychiatric:        Mood and Affect: Mood normal.      UC Treatments / Results  Labs (all labs ordered are listed, but only abnormal results are displayed) Labs Reviewed - No data to display  EKG   Radiology No results found.  Procedures Procedures (including critical care time)  Medications Ordered in UC Medications - No data to display  Initial Impression / Assessment and Plan / UC Course  I have reviewed the triage vital signs and the nursing notes.  Pertinent labs & imaging results that were available during my care of the patient were reviewed by me and considered in my medical decision making (see chart for details).      Final Clinical Impressions(s) / UC Diagnoses   Final diagnoses:  Bursitis of left hip, unspecified bursa   Discharge Instructions   None    ED Prescriptions   None    PDMP not reviewed this encounter. An After Visit Summary was printed and given to the patient.       Elson Areas, [place] 03/08/23 1024

## 2023-03-08 NOTE — Discharge Instructions (Addendum)
 Return if any problems.

## 2023-03-11 NOTE — Progress Notes (Signed)
 Initial neurology clinic note  Reason for Evaluation: Consultation requested by Ivonne Andrew, NP for an opinion regarding numbness and tingling in right neck and hand. My final recommendations will be communicated back to the requesting physician by way of shared medical record or letter to requesting physician via [place] mail.  HPI: This is [name], a 48 y.o. right-handed female with a medical history of B12 deficiency, vit D deficiency who presents to neurology clinic with the chief complaint of numbness and tingling in neck and right hand. The patient is accompanied by husband.  Patient has had numbness and tingling in the right hand. She has less severe symptoms in the left hand. She denies significant weakness in her hands. She also has numbness and tingling in right shoulder numbness and tingling. She denies significant neck pain. She had swelling of hands in the morning a few years ago, but numbness and tingling was not present.  She works with her hands all day. 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  She denies any numbness and tingling in her legs but was treated in the ED with bursitis which has improved with steroids.  She takes vit D 50000 international units once per week.  She report any constitutional symptoms like fever, night sweats, anorexia or unintentional weight loss.  EtOH use: rare  Restrictive diet? no Family history of neuropathy/myopathy/neurologic disease? No   MEDICATIONS:  Outpatient Encounter Medications as of 03/19/2023  Medication Sig   Melatonin 10 MG CAPS Take 20 mg by mouth at bedtime. 2 of 20 mg gummy at hs   omeprazole (PRILOSEC) 40 MG capsule TAKE 1 CAPSULE(40 MG) BY MOUTH IN THE MORNING AND AT BEDTIME   OVER THE COUNTER MEDICATION Pain reliever (acetaminophen 500 mg )  2 at hs   Vitamin D, Ergocalciferol, (DRISDOL) 1.25 MG (50000 UNIT) CAPS capsule Take 1 capsule (50,000 Units total) by mouth every 7 (seven) days.    clobetasol ointment (TEMOVATE) 0.05 % Apply 1 Application topically 2 (two) times daily. (Patient not taking: Reported on 01/29/2023)   ibuprofen (ADVIL) 600 MG tablet Take 1 tablet (600 mg total) by mouth every 8 (eight) hours as needed. (Patient not taking: Reported on 01/29/2023)   predniSONE (DELTASONE) 50 MG tablet One tablet a day for 5 days   SUMAtriptan (IMITREX) 25 MG tablet Take 1 tablet (25 mg total) by mouth every 2 (two) hours as needed for migraine. May repeat in 2 hours if headache persists or recurs.   tacrolimus (PROTOPIC) 0.1 % ointment APPLY TOPICALLY TO THE AFFECTED AREA TWICE DAILY (Patient not taking: Reported on 01/29/2023)   tiZANidine (ZANAFLEX) 4 MG tablet Take 1 tablet (4 mg total) by mouth every 6 (six) hours as needed for muscle spasms.   No facility-administered encounter medications on file as of 03/19/2023.    PAST MEDICAL HISTORY: Past Medical History:  Diagnosis Date   Abnormal uterine bleeding (AUB)    GERD (gastroesophageal reflux disease)    Grieving    son murdered 50yrs ago per pt on 10-14-2021   Insomnia 11/2018   Skin disorder    seen at [facility] for keratoderma   Vitamin B12 deficiency 12/2018   Vitamin D deficiency 12/2018    PAST SURGICAL HISTORY: Past Surgical History:  Procedure Laterality Date   COLONOSCOPY  05/10/2021   DILATATION & CURETTAGE/HYSTEROSCOPY WITH MYOSURE N/A 09/21/2019   Procedure: DILATATION & CURETTAGE/HYSTEROSCOPY WITH MYOSURE;  Surgeon: Conan Bowens, MD;  Location: [facility]  ;  Service: Gynecology;  Laterality: N/A;   DILITATION & CURRETTAGE/HYSTROSCOPY WITH HYDROTHERMAL ABLATION N/A 10/24/2021   Procedure: DILATATION & CURETTAGE/HYSTEROSCOPY WITH HYDROTHERMAL ABLATION;  Surgeon: Reva Bores, MD;  Location: [facility];  Service: Gynecology;  Laterality: N/A;   FOOT SURGERY Left 2008   HAND SURGERY Left 11/2020   to remove skin disorder keratoderma   lower endo [place]  07/12/2021   at  [facility], benign neuroendocrine tumor  6 mm x 4 mm removed from rectum, repeat lower endo [place] in 1 year   TUBAL LIGATION     UPPER GASTROINTESTINAL ENDOSCOPY  05/10/2021    ALLERGIES: Allergies  Allergen Reactions   Doxycycline Nausea Only and Rash    FAMILY HISTORY: Family History  Problem Relation Age of Onset   Arthritis Mother    Rashes / Skin problems Father    Diabetes Maternal Grandmother    Cancer Maternal Grandmother    Asthma Paternal Grandmother    Colon cancer Neg Hx    Pancreatic cancer Neg Hx    Stomach cancer Neg Hx    Liver cancer Neg Hx    Esophageal cancer Neg Hx     SOCIAL HISTORY: Social History   Tobacco Use   Smoking status: Every Day    Types: Cigarettes   Smokeless tobacco: Never   Tobacco comments:    Smoked 1 ppd for last 4 years,prior to that  1/2  ppd x 28 yrs- 03/19/23 not a whole pack a day5-7 left in pack  Vaping Use   Vaping status: Never Used  Substance Use Topics   Alcohol use: Yes    Comment: occ   Drug use: No   Social History   Social History Narrative   Are you right handed or left handed? Right   Are you currently employed ?    What is your current occupation? [employer]   Do you live at home alone?no   Who lives with you? Son, husband   What type of home do you live in: 1 story or 2 story? Second floor apartment    Caffeine 1 soda      OBJECTIVE: PHYSICAL EXAM: BP 131/68   Pulse 72   Ht 5' (1.524 m)   Wt 138 lb (62.6 kg)   SpO2 100%   BMI 26.95 kg/m   General: General appearance: Awake and alert. No distress. Cooperative with exam.  Skin: No obvious rash or jaundice. HEENT: Atraumatic. Anicteric. Lungs: Non-labored breathing on room air  Extremities: No edema. No obvious deformity.  Psych: Affect appropriate.  Neurological: Mental Status: Alert. Speech fluent. No pseudobulbar affect Cranial Nerves: CNII: No RAPD. Visual fields grossly intact. CNIII, IV, VI: PERRL. No nystagmus. EOMI. CN V: Facial sensation  intact bilaterally to fine touch. CN VII: Facial muscles symmetric and strong. No ptosis at rest. CN VIII: Hearing grossly intact bilaterally. CN IX: No hypophonia. CN X: Palate elevates symmetrically. CN XI: Full strength shoulder shrug bilaterally. CN XII: Tongue protrusion full and midline. No atrophy or fasciculations. No significant dysarthria Motor: Tone is normal. No atrophy.  Individual muscle group testing (MRC grade out of 5):  Movement     Neck flexion 5    Neck extension 5     Right Left   Shoulder abduction 5 5   Shoulder adduction 5 5   Shoulder ext rotation 5 5   Shoulder int rotation 5 5   Elbow flexion 5 5   Elbow extension 5 5  Wrist extension 5 5   Wrist flexion 5 5   Finger abduction - FDI 5 5   Finger abduction - ADM 5 5   Finger extension 5 5   Finger distal flexion - 2/3 5 5    Finger distal flexion - 4/5 5 5    Thumb flexion - FPL 5 5   Thumb abduction - APB 5- 5    Hip flexion 5 5   Hip extension 5 5   Hip adduction 5 5   Hip abduction 5 5   Knee extension 5 5   Knee flexion 5 5   Dorsiflexion 5 5   Plantarflexion 5 5     Reflexes:  Right Left   Bicep 2+ 2+   Tricep 2+ 2+   BrRad 2+ 2+   Knee 2+ 2+   Ankle 2+ 2+    Sensation: Pinprick diminished in medial aspect of right hand otherwise intact Coordination: Intact finger-to- nose-finger bilaterally. Romberg negative. Gait: Able to rise from chair with arms crossed unassisted. Normal, narrow-based gait.  Lab and Test Review: Internal labs: 01/29/23: B12: 262 (190 about 4 years ago) Vit D: 12.3 CBC significant for MCV of 99 CMP unremarkable  TSH (08/22/21) wnl Lipid panel (02/14/21): tChol 162, LDL 99, TG 190 HbA1c (12/06/2018): 5.7  ASSESSMENT: Vanessa Butler is a 48 y.o. female who presents for evaluation of right hand numbness and tingling. She has a relevant medical history of B12 deficiency, vit D deficiency. Her neurological examination is pertinent for mild weakness of  right APB. Her labs are significant for low vit D and borderline B12. Patient's hand pain could be carpal tunnel syndrome, though cervical radiculopathy or ulnar neuropathy are also possible. The numbness and tingling in her right shoulder is less clear. I will get an EMG to further clarify.  PLAN: -EMG: CTS (R > L) -B12 1000 mcg daily -Continue Vit D supplementation -Start gabapentin 300 mg at bedtime  -Return to clinic to be determined  The impression above as well as the plan as outlined below were extensively discussed with the patient (in the company of husband) who voiced understanding. All questions were answered to their satisfaction.  When available, results of the above investigations and possible further recommendations will be communicated to the patient via telephone/MyChart. Patient to call office if not contacted after expected testing turnaround time.    Thank you for allowing me to participate in patient's care.  If I can answer any additional questions, I would be pleased to do so.  Jacquelyne Balint, MD   CC: Ivonne Andrew, NP [address] [place] [address]  CC: Referring provider: Ivonne Andrew, NP [address] [place],  [place] [address]

## 2023-03-19 ENCOUNTER — Ambulatory Visit: Payer: MEDICAID | Admitting: Neurology

## 2023-03-19 ENCOUNTER — Encounter: Payer: Self-pay | Admitting: Neurology

## 2023-03-19 VITALS — BP 131/68 | HR 72 | Ht 60.0 in | Wt 138.0 lb

## 2023-03-19 DIAGNOSIS — M79641 Pain in right hand: Secondary | ICD-10-CM

## 2023-03-19 DIAGNOSIS — R209 Unspecified disturbances of skin sensation: Secondary | ICD-10-CM | POA: Diagnosis not present

## 2023-03-19 DIAGNOSIS — M25511 Pain in right shoulder: Secondary | ICD-10-CM

## 2023-03-19 MED ORDER — GABAPENTIN 300 MG PO CAPS: 300.0000 mg | ORAL_CAPSULE | Freq: Every day | ORAL | 5 refills | Status: DC

## 2023-03-19 NOTE — Patient Instructions (Signed)
 I saw you today for right hand pain and right shoulder numbness and tingling. The symptoms in your hand could be carpal tunnel syndrome. I want to do a muscle and nerve test called an EMG (see more information below) to be sure. We will discuss next steps after the EMG.  I am prescribing gabapentin 300 mg at bedtime for the pain. It may make you sleep or dizzy. Let me know if you have any problems with it.  Continue vitamin D supplement.  Your B12 was borderline low. Given your symptoms, I would recommend supplementing with B12 1000 mcg daily. This can be bought over the counter at any local drug store or online.   Cock up wrist splint for carpal tunnel symptoms can be bought in local drug stores or online. This should especially be worn at night while sleeping.     The physicians and staff at [facility] are committed to providing excellent care. You may receive a survey requesting feedback about your experience at our office. We strive to receive "very good" responses to the survey questions. If you feel that your experience would prevent you from giving the office a "very good " response, please contact our office to try to remedy the situation. We may be reached at [phone]. Thank you for taking the time out of your busy day to complete the survey.  Jacquelyne Balint, MD [facility]  ELECTROMYOGRAM AND NERVE CONDUCTION STUDIES (EMG/NCS) INSTRUCTIONS  How to Prepare The neurologist conducting the EMG will need to know if you have certain medical conditions. Tell the neurologist and other EMG lab personnel if you: Have a pacemaker or any other electrical medical device Take blood-thinning medications Have hemophilia, a blood-clotting disorder that causes prolonged bleeding Bathing Take a shower or bath shortly before your exam in order to remove oils from your skin. Don't apply lotions or creams before the exam.  What to Expect You'll likely be asked to change into a hospital  gown for the procedure and lie down on an examination table. The following explanations can help you understand what will happen during the exam.  Electrodes. The neurologist or a technician places surface electrodes at various locations on your skin depending on where you're experiencing symptoms. Or the neurologist may insert needle electrodes at different sites depending on your symptoms.  Sensations. The electrodes will at times transmit a tiny electrical current that you may feel as a twinge or spasm. The needle electrode may cause discomfort or pain that usually ends shortly after the needle is removed. If you are concerned about discomfort or pain, you may want to talk to the neurologist about taking a short break during the exam.  Instructions. During the needle EMG, the neurologist will assess whether there is any spontaneous electrical activity when the muscle is at rest - activity that isn't present in healthy muscle tissue - and the degree of activity when you slightly contract the muscle.  He or she will give you instructions on resting and contracting a muscle at appropriate times. Depending on what muscles and nerves the neurologist is examining, he or she may ask you to change positions during the exam.  After your EMG You may experience some temporary, minor bruising where the needle electrode was inserted into your muscle. This bruising should fade within several days. If it persists, contact your primary care doctor.

## 2023-03-31 ENCOUNTER — Other Ambulatory Visit: Payer: Self-pay | Admitting: Nurse Practitioner

## 2023-03-31 DIAGNOSIS — K219 Gastro-esophageal reflux disease without esophagitis: Secondary | ICD-10-CM

## 2023-04-14 ENCOUNTER — Telehealth: Payer: Self-pay | Admitting: Neurology

## 2023-04-14 ENCOUNTER — Ambulatory Visit: Payer: MEDICAID | Admitting: Neurology

## 2023-04-14 ENCOUNTER — Encounter: Payer: Self-pay | Admitting: Neurology

## 2023-04-14 DIAGNOSIS — G5601 Carpal tunnel syndrome, right upper limb: Secondary | ICD-10-CM

## 2023-04-14 DIAGNOSIS — M79641 Pain in right hand: Secondary | ICD-10-CM

## 2023-04-14 DIAGNOSIS — R209 Unspecified disturbances of skin sensation: Secondary | ICD-10-CM

## 2023-04-14 NOTE — Procedures (Signed)
  [facility]  [address] [place], [address]  [place], [place] [address] Tel: [phone] Fax: [phone] Test Date:  04/14/2023  Patient: Vanessa Butler DOB: 29-Mar-1975 Physician: Jacquelyne Balint, MD  Sex: Female Height: 5\' 0"  Ref Phys: Jacquelyne Balint, MD  ID#: [id number]   Technician:    History: This is a 48 year old female with numbness and tingling in right neck and hand.  NCV & EMG Findings: Extensive electrodiagnostic evaluation of the right upper limb shows: Right median sensory response shows prolonged distal peak latency (3.6 ms). Right ulnar and radial sensory responses are within normal limits. Right median (APB) and ulnar (ADM) motor responses are within normal limits. There is no evidence of active or chronic motor axon loss changes affecting any of the tested muscles on needle examination. Motor unit configuration and recruitment pattern is within normal limits.  Impression: This is an abnormal study. The findings are most consistent with the following: Evidence of a right median mononeuropathy at or distal to the wrist, consistent with carpal tunnel syndrome, mild in degree electrically. No electrodiagnostic evidence of a right cervical (C5-C8) motor radiculopathy or right ulnar mononeuropathy.    ___________________________ Jacquelyne Balint, MD    Nerve Conduction Studies Motor Nerve Results    Latency Amplitude F-Lat Segment Distance CV Comment  Site (ms) Norm (mV) Norm (ms)  (cm) (m/s) Norm   Right Median (APB) Motor  Wrist 3.0  < 3.9 11.4  > 6.0        Elbow 7.0 - 10.9 -  Elbow-Wrist 24.5 61  > 50   Right Ulnar (ADM) Motor  Wrist 1.83  < 3.1 14.1  > 7.0        Bel elbow 5.0 - 13.4 -  Bel elbow-Wrist 20 63  > 50   Ab elbow 6.7 - 13.3 -  Ab elbow-Bel elbow 10 59 -    Sensory Sites    Neg Peak Lat Amplitude (O-P) Segment Distance Velocity Comment  Site (ms) Norm (V) Norm  (cm) (ms)   Right Median Sensory  Wrist-Dig II *3.6  < 3.4 34  > 20 Wrist-Dig II  13    Right Radial Sensory  Forearm-Wrist 1.95  < 2.7 47  > 18 Forearm-Wrist 10    Right Ulnar Sensory  Wrist-Dig V 2.5  < 3.1 48  > 12 Wrist-Dig V 11     Electromyography   Side Muscle Ins.Act Fibs Fasc Recrt Amp Dur Poly Activation Comment  Right FDI Nml Nml Nml Nml Nml Nml Nml Nml N/A  Right EIP Nml Nml Nml Nml Nml Nml Nml Nml N/A  Right Pronator teres Nml Nml Nml Nml Nml Nml Nml Nml N/A  Right Biceps Nml Nml Nml Nml Nml Nml Nml Nml N/A  Right Triceps Nml Nml Nml Nml Nml Nml Nml Nml N/A  Right Deltoid Nml Nml Nml Nml Nml Nml Nml Nml N/A      Waveforms:  Motor      Sensory

## 2023-04-14 NOTE — Telephone Encounter (Signed)
 Error

## 2023-04-14 NOTE — Telephone Encounter (Signed)
 Discussed the results of patient's EMG after the procedure today. It showed right carpal tunnel syndrome, mild in degree electrically. Patient states she has been wearing the CTS wrist brace. She has not noticed much difference yet. I encouraged her to continue to use. She will reach out if symptoms persist or worsen, as next step may be surgical referral for CTS release.  All questions were answered.  Vanessa Balint, MD [facility]

## 2023-05-05 ENCOUNTER — Other Ambulatory Visit: Payer: Self-pay | Admitting: Nurse Practitioner

## 2023-05-05 DIAGNOSIS — K219 Gastro-esophageal reflux disease without esophagitis: Secondary | ICD-10-CM

## 2023-05-21 ENCOUNTER — Other Ambulatory Visit: Payer: Self-pay | Admitting: Nurse Practitioner

## 2023-05-21 DIAGNOSIS — G43809 Other migraine, not intractable, without status migrainosus: Secondary | ICD-10-CM

## 2023-05-21 DIAGNOSIS — R202 Paresthesia of skin: Secondary | ICD-10-CM

## 2023-06-15 ENCOUNTER — Other Ambulatory Visit: Payer: Self-pay | Admitting: Nurse Practitioner

## 2023-06-15 DIAGNOSIS — K219 Gastro-esophageal reflux disease without esophagitis: Secondary | ICD-10-CM

## 2023-08-30 ENCOUNTER — Other Ambulatory Visit: Payer: Self-pay | Admitting: Nurse Practitioner

## 2023-08-30 DIAGNOSIS — G43809 Other migraine, not intractable, without status migrainosus: Secondary | ICD-10-CM

## 2023-08-31 ENCOUNTER — Other Ambulatory Visit: Payer: Self-pay | Admitting: Nurse Practitioner

## 2023-08-31 DIAGNOSIS — K219 Gastro-esophageal reflux disease without esophagitis: Secondary | ICD-10-CM

## 2023-08-31 NOTE — Telephone Encounter (Signed)
 Please advise [facility]

## 2023-09-23 ENCOUNTER — Encounter (HOSPITAL_COMMUNITY): Payer: Self-pay | Admitting: Emergency Medicine

## 2023-09-23 ENCOUNTER — Ambulatory Visit (HOSPITAL_COMMUNITY)
Admission: EM | Admit: 2023-09-23 | Discharge: 2023-09-23 | Disposition: A | Discharge status: Home / Self Care | Payer: MEDICAID | Attending: Family Medicine | Admitting: Family Medicine

## 2023-09-23 DIAGNOSIS — R110 Nausea: Secondary | ICD-10-CM

## 2023-09-23 DIAGNOSIS — S0990XA Unspecified injury of head, initial encounter: Secondary | ICD-10-CM

## 2023-09-23 DIAGNOSIS — S060X0A Concussion without loss of consciousness, initial encounter: Secondary | ICD-10-CM

## 2023-09-23 MED ORDER — ONDANSETRON HCL 4 MG PO TABS: 4.0000 mg | ORAL_TABLET | Freq: Four times a day (QID) | ORAL | 0 refills | Status: DC

## 2023-09-23 MED ORDER — ONDANSETRON 4 MG PO TBDP
ORAL_TABLET | ORAL | Status: AC
  Filled 2023-09-23: qty 1

## 2023-09-23 MED ORDER — ONDANSETRON 4 MG PO TBDP
4.0000 mg | ORAL_TABLET | Freq: Once | ORAL | Status: AC
  Administered 2023-09-23: 4 mg via ORAL

## 2023-09-23 NOTE — Discharge Instructions (Addendum)
 You were seen today for head injury and concussion.  I have given you information on this today.  I have given you a medication for nausea while here, and sent this to your pharmacy as well.  I recommend you get as much rest as possible to help the healing.  Please stay out of work.  If you are unable to return on Monday, then please return here or follow up with your primary care provider.

## 2023-09-23 NOTE — ED Provider Notes (Signed)
 [facility]    CSN: [id number] Arrival date & time: 09/23/23  1344      History   Chief Complaint Chief Complaint  Patient presents with   Head Injury    HPI Vanessa Butler is a 48 y.o. female.    Head Injury Associated symptoms: headache and nausea    Patient is here for head injury.  She was at work, and as she was pulling a cart of cases of energy drinks, the wheel got stuck, and landed on her head.  The cart is taller than her, and a case hit her at the right frontal area.  She did not fall to ground, or hit her head otherwise.  Started dizziness, nausea, felt jittery.  This happened about 3 hrs ago.  She still feels a bit dizzy and jitteriness.   Some nausea but no vomiting.  No neck pain.  No loc.        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   Abnormal uterine bleeding (AUB) 08/22/2021   Insomnia 12/13/2018   Hyperglycemia 12/13/2018   Gastroesophageal reflux disease without esophagitis 05/20/2018   Tobacco dependence 02/27/2016   Neuropathy 02/27/2016    Past Surgical History:  Procedure Laterality Date   COLONOSCOPY  05/10/2021   DILATATION & CURETTAGE/HYSTEROSCOPY WITH MYOSURE N/A 09/21/2019   Procedure: DILATATION & CURETTAGE/HYSTEROSCOPY WITH MYOSURE;  Surgeon: Nicholaus Burnard HERO, MD;  Location: [facility];  Service: Gynecology;  Laterality: N/A;   DILITATION & CURRETTAGE/HYSTROSCOPY WITH HYDROTHERMAL ABLATION N/A 10/24/2021   Procedure: DILATATION & CURETTAGE/HYSTEROSCOPY WITH  HYDROTHERMAL ABLATION;  Surgeon: Fredirick Glenys RAMAN, MD;  Location: [facility];  Service: Gynecology;  Laterality: N/A;   FOOT SURGERY Left 2008   HAND SURGERY Left 11/2020   to remove skin disorder keratoderma   lower endo [facility]   07/12/2021   at [facility], benign neuroendocrine tumor  6 mm x 4 mm removed from rectum, repeat lower endo [facility]  in 1 year   TUBAL LIGATION     UPPER GASTROINTESTINAL ENDOSCOPY  05/10/2021    OB History     Gravida  3   Para  3   Term  2   Preterm  1   AB      Living  2      SAB      IAB      Ectopic      Multiple      Live Births  3            Home Medications    Prior to Admission medications   Medication Sig Start Date End Date Taking? Authorizing Provider  gabapentin  (NEURONTIN ) 300 MG capsule Take 1 capsule (300 mg total) by mouth at bedtime. 03/19/23   Leigh Venetia CROME, MD  Melatonin 10 MG CAPS Take 20 mg by mouth at bedtime. 2 of 20 mg gummy at hs    [provider]  omeprazole  (PRILOSEC) 40 MG capsule TAKE 1 CAPSULE(40 MG) BY MOUTH IN THE MORNING  AND AT BEDTIME 08/31/23   [name], NP  OVER THE COUNTER MEDICATION Pain reliever (acetaminophen  500 mg )  2 at hs    [provider]  SUMAtriptan  (IMITREX ) 25 MG tablet TAKE 1 TABLET BY MOUTH EVERY 2 HOURS AS NEEDED FOR MIGRAINE. MAY REPEAT IN 2 HOURS IF HEADACHE PERSISTES OR RECURS 08/31/23   [name], NP  tiZANidine  (ZANAFLEX ) 4 MG tablet TAKE 1 TABLET(4 MG) BY MOUTH EVERY 6 HOURS AS NEEDED FOR MUSCLE SPASMS 05/22/23   [name], NP  Vitamin D , Ergocalciferol , (DRISDOL ) 1.25 MG (50000 UNIT) CAPS capsule TAKE 1 CAPSULE BY MOUTH EVERY 7 DAYS 05/05/23   [name], NP    Family History Family History  Problem Relation Age of Onset   Arthritis Mother    Rashes / Skin problems Father    Diabetes Maternal Grandmother    Cancer Maternal Grandmother    Asthma Paternal Grandmother    Colon cancer Neg Hx    Pancreatic cancer Neg Hx    Stomach  cancer Neg Hx    Liver cancer Neg Hx    Esophageal cancer Neg Hx     Social History Social History   Tobacco Use   Smoking status: Every Day    Types: Cigarettes   Smokeless tobacco: Never   Tobacco comments:    Smoked 1 ppd for last 4 years,prior to that  1/2  ppd x 28 yrs- 03/19/23 not a whole pack a day5-7 left in pack  Vaping Use   Vaping status: Never Used  Substance Use Topics   Alcohol use: Yes    Comment: occ   Drug use: No     Allergies   Doxycycline   Review of Systems Review of Systems  Constitutional:  Positive for fatigue.  HENT: Negative.    Gastrointestinal:  Positive for nausea.  Neurological:  Positive for dizziness and headaches.     Physical Exam Triage Vital Signs ED Triage Vitals  Encounter Vitals Group     BP 09/23/23 1359 129/80     Girls Systolic BP Percentile --      Girls Diastolic BP Percentile --      Boys Systolic BP Percentile --      Boys Diastolic BP Percentile --      Pulse Rate 09/23/23 1359 67     Resp 09/23/23 1359 16     Temp 09/23/23 1359 98.1 F (36.7 C)     Temp Source 09/23/23 1359 Oral     SpO2 09/23/23 1359 99 %     Weight --      Height --      Head Circumference --      Peak Flow --      Pain Score 09/23/23 1357 0     Pain Loc --      Pain Education --      Exclude from Growth Chart --    No data found.  Updated Vital Signs BP 129/80 (BP Location: Right Arm)   Pulse 67   Temp 98.1 F (36.7 C) (Oral)   Resp 16   SpO2 99%   Visual Acuity Right Eye Distance:   Left Eye Distance:   Bilateral Distance:    Right Eye Near:   Left Eye Near:    Bilateral Near:     Physical Exam Constitutional:      General: She is not in acute distress.    Appearance: Normal appearance. She is normal weight. She  is not ill-appearing or toxic-appearing.  HENT:     Head:     Comments: Mild tenderness to the right side of the forehead.  No bruising or swelling is noted.     Nose: Nose normal.     Mouth/Throat:      Mouth: Mucous membranes are moist.  Eyes:     Extraocular Movements: Extraocular movements intact.     Conjunctiva/sclera: Conjunctivae normal.     Pupils: Pupils are equal, round, and reactive to light.  Cardiovascular:     Rate and Rhythm: Normal rate and regular rhythm.  Pulmonary:     Effort: Pulmonary effort is normal.     Breath sounds: Normal breath sounds.  Musculoskeletal:     Cervical back: Normal range of motion and neck supple. No tenderness.     Comments: No spinous tenderness;  slightly tender to the right upper back;  full rom of the neck without pain or limitation  Neurological:     General: No focal deficit present.     Mental Status: She is alert.  Psychiatric:        Mood and Affect: Mood normal.      UC Treatments / Results  Labs (all labs ordered are listed, but only abnormal results are displayed) Labs Reviewed - No data to display  EKG   Radiology No results found.  Procedures Procedures (including critical care time)  Medications Ordered in UC Medications  ondansetron  (ZOFRAN -ODT) disintegrating tablet 4 mg (has no administration in time range)    Initial Impression / Assessment and Plan / UC Course  I have reviewed the triage vital signs and the nursing notes.  Pertinent labs & imaging results that were available during my care of the patient were reviewed by me and considered in my medical decision making (see chart for details).   Final Clinical Impressions(s) / UC Diagnoses   Final diagnoses:  Minor head injury, initial encounter  Concussion without loss of consciousness, initial encounter  Nausea without vomiting     Discharge Instructions      You were seen today for head injury and concussion.  I have given you information on this today.  I have given you a medication for nausea while here, and sent this to your pharmacy as well.  I recommend you get as much rest as possible to help the healing.  Please stay out of work.  If  you are unable to return on Monday, then please return here or follow up with your primary care provider.     ED Prescriptions     Medication Sig Dispense Auth. Provider   ondansetron  (ZOFRAN ) 4 MG tablet Take 1 tablet (4 mg total) by mouth every 6 (six) hours. 12 tablet Darral Longs, MD      PDMP not reviewed this encounter.   Darral Longs, MD 09/23/23 1426

## 2023-09-23 NOTE — ED Triage Notes (Signed)
 Pt reports at work when energy drinks fell and hit her in the head. Reports dizziness and feeling funny. Denies LOC or taking blood thinners. Felt nauseated but denies vomiting.

## 2023-09-29 ENCOUNTER — Other Ambulatory Visit: Payer: Self-pay | Admitting: Neurology

## 2023-09-29 DIAGNOSIS — M79641 Pain in right hand: Secondary | ICD-10-CM

## 2023-09-29 DIAGNOSIS — R209 Unspecified disturbances of skin sensation: Secondary | ICD-10-CM

## 2023-10-30 ENCOUNTER — Other Ambulatory Visit: Payer: Self-pay | Admitting: Nurse Practitioner

## 2023-10-30 DIAGNOSIS — K219 Gastro-esophageal reflux disease without esophagitis: Secondary | ICD-10-CM

## 2023-11-05 ENCOUNTER — Other Ambulatory Visit: Payer: Self-pay

## 2023-11-05 ENCOUNTER — Encounter (HOSPITAL_COMMUNITY): Payer: Self-pay

## 2023-11-05 ENCOUNTER — Emergency Department (HOSPITAL_COMMUNITY)
Admission: EM | Admit: 2023-11-05 | Discharge: 2023-11-05 | Disposition: A | Discharge status: Home / Self Care | Payer: Worker's Compensation | Source: Ambulatory Visit | Attending: Emergency Medicine | Admitting: Emergency Medicine

## 2023-11-05 ENCOUNTER — Ambulatory Visit: Payer: Self-pay

## 2023-11-05 ENCOUNTER — Emergency Department (HOSPITAL_COMMUNITY): Payer: Worker's Compensation

## 2023-11-05 DIAGNOSIS — S060X0A Concussion without loss of consciousness, initial encounter: Secondary | ICD-10-CM | POA: Diagnosis not present

## 2023-11-05 DIAGNOSIS — X58XXXA Exposure to other specified factors, initial encounter: Secondary | ICD-10-CM | POA: Diagnosis not present

## 2023-11-05 DIAGNOSIS — R519 Headache, unspecified: Secondary | ICD-10-CM | POA: Diagnosis present

## 2023-11-05 LAB — POC URINE PREG, ED: Preg Test, Ur: NEGATIVE

## 2023-11-05 LAB — URINALYSIS, ROUTINE W REFLEX MICROSCOPIC
Bilirubin Urine: NEGATIVE
Glucose, UA: NEGATIVE mg/dL
Hgb urine dipstick: NEGATIVE
Ketones, ur: NEGATIVE mg/dL
Leukocytes,Ua: NEGATIVE
Nitrite: NEGATIVE
Protein, ur: NEGATIVE mg/dL
Specific Gravity, Urine: 1.003 — ABNORMAL LOW (ref 1.005–1.030)
pH: 7 (ref 5.0–8.0)

## 2023-11-05 LAB — COMPREHENSIVE METABOLIC PANEL WITH GFR
ALT: 12 U/L (ref 0–44)
AST: 16 U/L (ref 15–41)
Albumin: 4 g/dL (ref 3.5–5.0)
Alkaline Phosphatase: 53 U/L (ref 38–126)
Anion gap: 10 (ref 5–15)
BUN: 6 mg/dL (ref 6–20)
CO2: 22 mmol/L (ref 22–32)
Calcium: 8.9 mg/dL (ref 8.9–10.3)
Chloride: 102 mmol/L (ref 98–111)
Creatinine, Ser: 0.7 mg/dL (ref 0.44–1.00)
GFR, Estimated: >60 mL/min (ref >60)
Glucose, Bld: 79 mg/dL (ref 70–99)
Potassium: 4 mmol/L (ref 3.5–5.1)
Sodium: 134 mmol/L — ABNORMAL LOW (ref 135–145)
Total Bilirubin: 0.5 mg/dL (ref 0.0–1.2)
Total Protein: 6.1 g/dL — ABNORMAL LOW (ref 6.5–8.1)

## 2023-11-05 LAB — I-STAT CHEM 8, ED
BUN: 7 mg/dL (ref 6–20)
Calcium, Ion: 1.12 mmol/L — ABNORMAL LOW (ref 1.15–1.40)
Chloride: 103 mmol/L (ref 98–111)
Creatinine, Ser: 0.7 mg/dL (ref 0.44–1.00)
Glucose, Bld: 79 mg/dL (ref 70–99)
HCT: 40 % (ref 36.0–46.0)
Hemoglobin: 13.6 g/dL (ref 12.0–15.0)
Potassium: 4.1 mmol/L (ref 3.5–5.1)
Sodium: 137 mmol/L (ref 135–145)
TCO2: 22 mmol/L (ref 22–32)

## 2023-11-05 LAB — CBC WITH DIFFERENTIAL/PLATELET
Abs Immature Granulocytes: 0 K/uL (ref 0.00–0.07)
Basophils Absolute: 0 K/uL (ref 0.0–0.1)
Basophils Relative: 1 %
Eosinophils Absolute: 0 K/uL (ref 0.0–0.5)
Eosinophils Relative: 1 %
HCT: 39.9 % (ref 36.0–46.0)
Hemoglobin: 13.3 g/dL (ref 12.0–15.0)
Immature Granulocytes: 0 %
Lymphocytes Relative: 45 %
Lymphs Abs: 2.2 K/uL (ref 0.7–4.0)
MCH: 33.4 pg (ref 26.0–34.0)
MCHC: 33.3 g/dL (ref 30.0–36.0)
MCV: 100.3 fL — ABNORMAL HIGH (ref 80.0–100.0)
Monocytes Absolute: 0.4 K/uL (ref 0.1–1.0)
Monocytes Relative: 7 %
Neutro Abs: 2.2 K/uL (ref 1.7–7.7)
Neutrophils Relative %: 46 %
Platelets: 316 K/uL (ref 150–400)
RBC: 3.98 MIL/uL (ref 3.87–5.11)
RDW: 12.6 % (ref 11.5–15.5)
WBC: 4.8 K/uL (ref 4.0–10.5)
nRBC: 0 % (ref 0.0–0.2)

## 2023-11-05 MED ORDER — METOCLOPRAMIDE HCL 10 MG PO TABS: 10.0000 mg | ORAL_TABLET | Freq: Four times a day (QID) | ORAL | 0 refills | Status: DC

## 2023-11-05 MED ORDER — ACETAMINOPHEN 500 MG PO TABS
1000.0000 mg | ORAL_TABLET | ORAL | Status: AC
  Administered 2023-11-05: 1000 mg via ORAL
  Filled 2023-11-05: qty 2

## 2023-11-05 MED ORDER — METOCLOPRAMIDE HCL 10 MG PO TABS
10.0000 mg | ORAL_TABLET | Freq: Once | ORAL | Status: AC
  Administered 2023-11-05: 10 mg via ORAL
  Filled 2023-11-05: qty 1

## 2023-11-05 NOTE — Telephone Encounter (Signed)
 FYI: Called patient and advised her to go to ED, she stated that she is on her way there

## 2023-11-05 NOTE — Discharge Instructions (Addendum)
 I have given you the information for a neurologist that specializes in concussions.  Follow-up with your primary care doctor.  Read the attached information, make sure you are drinking plenty of water.  The Reglan that I prescribed you also helps with nausea.  Please take this if you are feeling nauseated.

## 2023-11-05 NOTE — ED Triage Notes (Signed)
 Pt came in via POV d/t the last month having a concussion at work on Sep, 10 th last month. Today she has been having 9/10 HA & having n/v.

## 2023-11-05 NOTE — ED Provider Triage Note (Signed)
 Emergency Medicine Provider Triage Evaluation Note  Vanessa Butler , a 48 y.o. female  was evaluated in triage.  Pt complains of worsening headache and dizziness.  Patient states she got a concussion approximately 1 month ago and has had an intermittent headache and dizziness since that time.  However approximately 3 days ago she states that the headache has gotten worse.  Additionally, today she stated she became nauseous and vomited at lunchtime.  She reports the nausea is due to the headache's severity.  She is not currently nauseous at this time.  She denies LOC.  She does report dizziness like the room is spinning.  Focal neuroexam intact.  Review of Systems  Positive: Dizziness, headache Negative: Chest pain, shortness of breath  Physical Exam  BP 112/66   Pulse 74   Temp 98.1 F (36.7 C) (Oral)   Resp 16   SpO2 100%  Gen:   Awake, no distress  Resp:  Normal effort MSK:   Moves extremities without difficulty Other:  Upper extremity and lower extremity strength 5 out of 5.  Normal speech.  No facial droop.  EOMs intact.  Medical Decision Making  Medically screening exam initiated at 3:47 PM.  Appropriate orders placed.  Vanessa Butler was informed that the remainder of the evaluation will be completed by another provider, this initial triage assessment does not replace that evaluation, and the importance of remaining in the ED until their evaluation is complete.  Labs, UA, CT head Noncon, EKG ordered.   Vanessa Marry RAMAN, PA-C 11/05/23 1552

## 2023-11-05 NOTE — Telephone Encounter (Addendum)
 Pt triaged to ED.  FYI Only or Action Required?: FYI only for provider.  Patient was last seen in primary care on 01/29/2023 by [name], NP.  Called Nurse Triage reporting No chief complaint on file..  Symptoms began several weeks ago.  Interventions attempted: Prescription medications: ondansetron , tylenol .  Symptoms are: gradually worsening.  Triage Disposition: Go to ED Now (or PCP Triage)  Patient/caregiver understands and will follow disposition?: Yes  Reason for Disposition  Concussion symptoms are getting WORSE  Answer Assessment - Initial Assessment Questions Pt was seen at [facility] on 09/23/23 for head injury and at that time, reported dizziness and nausea. Dx as head injury/concussion.  Patient reports intermittent nausea and dizziness since 09/23/23 and developed a constant 8/10 headache yesterday. Taking tylenol  and ondansetron  with only mild improvement. Denies numbness beyond her baseline of neuropathy. Speaking in clear and full sentences.   Advised to be evaluated in the ED with rationales provided. Pt states she will go to [facility].   1. SYMPTOMS: What symptom are you most concerned about?     8/10 headache  2. OTHER SYMPTOMS: Do you have any other symptoms? (e.g., nausea, difficulty concentrating)     See above  3. ONSET / PATTERN:  Are you the same, getting better, or getting worse?  What's changed?     Constant/same  4. HEADACHE: Do you have any headache pain? If Yes, ask: How bad is it?  (Scale 0-10; none or mild, moderate, severe)     8/10  5. mTBI: When did your head injury happen? How did it occur?      09/23/23. Cart with case fell, striking her head.  6. mTBI DIAGNOSIS:  Who diagnosed your concussion?     Urgent Care  7. mTBI TESTING: Did you have a CT scan or MRI of your head?     Not performed  8. mTBI LAST VISIT:  When were you seen for your head injury?     09/23/23  9. MEDICINES:  Did you receive any  prescription medicines?  If Yes, ask:  What are they? Were any over-the-counter meds recommended?     Ondansetron   Protocols used: Concussion (mTBI) Less Than 14 Days Ago Follow-up Call-[facility]  Message from [facility] sent at 11/05/2023  2:05 PM EDT  Reason for Triage: At work yesterday dizzy w/bad headache.. nauseated today.. had an concussion a month ago

## 2023-11-06 NOTE — ED Provider Notes (Signed)
 [place] [facility]MENT AT [facility] Provider Note   CSN: [id number] Arrival date & time: 11/05/23  1519     Patient presents with: Headache and Dizziness   Vanessa Butler is a 48 y.o. female.    Headache Associated symptoms: dizziness   Dizziness Associated symptoms: headaches    Pt complains of worsening headache and dizziness.  Patient states she diagnosed with a concussion approximately 1 month ago and has had an intermittent headache and dizziness since that time.  However approximately 3 days ago she states that the headache has gotten worse.  Additionally, today she stated she became nauseous and vomited at lunchtime.  She reports the nausea is due to the headache's severity.  She is not currently nauseous at this time.  She denies LOC.  She does report dizziness like the room is spinning.  She states that this is intermittent and is not currently happening.     Prior to Admission medications   Medication Sig Start Date End Date Taking? Authorizing Provider  metoCLOPramide (REGLAN) 10 MG tablet Take 1 tablet (10 mg total) by mouth every 6 (six) hours. 11/05/23  Yes Dujuan Stankowski, Hamp S, PA  gabapentin  (NEURONTIN ) 300 MG capsule TAKE 1 CAPSULE(300 MG) BY MOUTH AT BEDTIME 10/01/23   Leigh Venetia CROME, MD  Melatonin 10 MG CAPS Take 20 mg by mouth at bedtime. 2 of 20 mg gummy at hs    [provider]  omeprazole  (PRILOSEC) 40 MG capsule TAKE 1 CAPSULE(40 MG) BY MOUTH IN THE MORNING AND AT BEDTIME 10/30/23   Nichols, Tonya S, NP  ondansetron  (ZOFRAN ) 4 MG tablet Take 1 tablet (4 mg total) by mouth every 6 (six) hours. 09/23/23   Piontek, Rocky, MD  OVER THE COUNTER MEDICATION Pain reliever (acetaminophen  500 mg )  2 at hs    [provider]  SUMAtriptan  (IMITREX ) 25 MG tablet TAKE 1 TABLET BY MOUTH EVERY 2 HOURS AS NEEDED FOR MIGRAINE. MAY REPEAT IN 2 HOURS IF HEADACHE PERSISTES OR RECURS 08/31/23   Nichols, Tonya S, NP  tiZANidine  (ZANAFLEX ) 4 MG  tablet TAKE 1 TABLET(4 MG) BY MOUTH EVERY 6 HOURS AS NEEDED FOR MUSCLE SPASMS 05/22/23   [name], NP  Vitamin D , Ergocalciferol , (DRISDOL ) 1.25 MG (50000 UNIT) CAPS capsule TAKE 1 CAPSULE BY MOUTH EVERY 7 DAYS 05/05/23   [name], NP    Allergies: Doxycycline    Review of Systems  Neurological:  Positive for dizziness and headaches.    Updated Vital Signs BP (!) 140/67 (BP Location: Right Arm)   Pulse 70   Temp 98.1 F (36.7 C)   Resp 16   Ht 5' (1.524 m)   Wt 62.6 kg   SpO2 100%   BMI 26.95 kg/m   Physical Exam Vitals and nursing note reviewed.  Constitutional:      General: She is not in acute distress. HENT:     Head: Normocephalic and atraumatic.     Nose: Nose normal.  Eyes:     General: No scleral icterus. Cardiovascular:     Rate and Rhythm: Normal rate and regular rhythm.     Pulses: Normal pulses.     Heart sounds: Normal heart sounds.  Pulmonary:     Effort: Pulmonary effort is normal. No respiratory distress.     Breath sounds: No wheezing.  Abdominal:     Palpations: Abdomen is soft.     Tenderness: There is no abdominal tenderness.  Musculoskeletal:  Cervical back: Normal range of motion.     Right lower leg: No edema.     Left lower leg: No edema.  Skin:    General: Skin is warm and dry.     Capillary Refill: Capillary refill takes less than 2 seconds.  Neurological:     Mental Status: She is alert. Mental status is at baseline.     Comments: Alert and oriented to self, place, time and event.   Speech is fluent, clear without dysarthria or dysphasia.   Strength 5/5 in upper/lower extremities   Sensation intact in upper/lower extremities   Normal gait  CN I not tested  CN II grossly intact visual fields bilaterally. Did not visualize posterior eye.  CN III, IV, VI PERRLA and EOMs intact bilaterally  CN V Intact sensation to sharp and light touch to the face  CN VII facial movements symmetric  CN VIII not tested  CN IX, X no  uvula deviation, symmetric rise of soft palate  CN XI 5/5 SCM and trapezius strength bilaterally  CN XII Midline tongue protrusion, symmetric L/R movements   Psychiatric:        Mood and Affect: Mood normal.        Behavior: Behavior normal.     (all labs ordered are listed, but only abnormal results are displayed) Labs Reviewed  COMPREHENSIVE METABOLIC PANEL WITH GFR - Abnormal; Notable for the following components:      Result Value   Sodium 134 (*)    Total Protein 6.1 (*)    All other components within normal limits  CBC WITH DIFFERENTIAL/PLATELET - Abnormal; Notable for the following components:   MCV 100.3 (*)    All other components within normal limits  URINALYSIS, ROUTINE W REFLEX MICROSCOPIC - Abnormal; Notable for the following components:   Color, Urine STRAW (*)    Specific Gravity, Urine 1.003 (*)    All other components within normal limits  I-STAT CHEM 8, ED - Abnormal; Notable for the following components:   Calcium, Ion 1.12 (*)    All other components within normal limits  POC URINE PREG, ED    EKG: None  Radiology: CT Head Wo Contrast Result Date: 11/05/2023 EXAM: CT HEAD WITHOUT CONTRAST 11/05/2023 04:00:55 PM TECHNIQUE: CT of the head was performed without the administration of intravenous contrast. Automated exposure control, iterative reconstruction, and/or weight based adjustment of the mA/kV was utilized to reduce the radiation dose to as low as reasonably achievable. COMPARISON: None available. CLINICAL HISTORY: Headache, increasing frequency or severity. FINDINGS: BRAIN AND VENTRICLES: No acute hemorrhage. No evidence of acute infarct. No hydrocephalus. No extra-axial collection. No mass effect or midline shift. ORBITS: No acute abnormality. SINUSES: No acute abnormality. SOFT TISSUES AND SKULL: No acute soft tissue abnormality. No skull fracture. IMPRESSION: 1. No acute intracranial abnormality. Electronically signed by: Lonni Necessary MD  11/05/2023 04:11 PM EDT RP Workstation: [id number]     Procedures   Medications Ordered in the ED  acetaminophen  (TYLENOL ) tablet 1,000 mg (1,000 mg Oral Given 11/05/23 2123)  metoCLOPramide (REGLAN) tablet 10 mg (10 mg Oral Given 11/05/23 2125)                                    Medical Decision Making Risk OTC drugs. Prescription drug management.   Pt complains of worsening headache and dizziness.  Patient states she diagnosed with a concussion approximately 1 month ago and  has had an intermittent headache and dizziness since that time.  However approximately 3 days ago she states that the headache has gotten worse.  Additionally, today she stated she became nauseous and vomited at lunchtime.  She reports the nausea is due to the headache's severity.  She is not currently nauseous at this time.  She denies LOC.  She does report dizziness like the room is spinning.  She states that this is intermittent and is not currently happening.  Normal neuroexam well-appearing on exam  CMP CBC urinalysis unremarkable urine pregnancy negative CT head unremarkable EKG without acute findings.  Patient is ambulating feels well headache resolved with Tylenol  and Reglan I did offer her IV medications but she declines this would only like some Tylenol  and Reglan.   Final diagnoses:  Concussion without loss of consciousness, initial encounter    ED Discharge Orders          Ordered    metoCLOPramide (REGLAN) 10 MG tablet  Every 6 hours        11/05/23 2124               Neldon Hamp RAMAN, [place] 11/06/23 0017    Mannie Pac T, DO 11/06/23 2219

## 2023-11-10 NOTE — Progress Notes (Unsigned)
 Subjective:    Patient ID: Vanessa Butler, female    DOB: [birth year], 48 y.o.   MRN: [id number]     HPI Caralee is here for follow up from the ED  ED 11/05/2023  Presented with headache and dizziness.  He had been diagnosed with a concussion 1 month prior and was having intermittent headaches and dizziness since then.  3 days before going to the emergency room the headache had gotten worse.  He also had nausea and vomited that day.  The nausea was related to the severity of the headache.  When she was in the emergency room she did not have dizziness or nausea-symptoms were intermittent.  Neurological exam intact in the emergency room.  CBC, CMP, UA, urine pregnancy unremarkable.  CT head unremarkable.  EKG without acute findings.  She received Tylenol  and Reglan.   Medications and allergies reviewed with patient and updated if appropriate.  Current Outpatient Medications on File Prior to Visit  Medication Sig Dispense Refill   gabapentin  (NEURONTIN ) 300 MG capsule TAKE 1 CAPSULE(300 MG) BY MOUTH AT BEDTIME 30 capsule 5   Melatonin 10 MG CAPS Take 20 mg by mouth at bedtime. 2 of 20 mg gummy at hs     metoCLOPramide (REGLAN) 10 MG tablet Take 1 tablet (10 mg total) by mouth every 6 (six) hours. 30 tablet 0   omeprazole  (PRILOSEC) 40 MG capsule TAKE 1 CAPSULE(40 MG) BY MOUTH IN THE MORNING AND AT BEDTIME 180 capsule 2   ondansetron  (ZOFRAN ) 4 MG tablet Take 1 tablet (4 mg total) by mouth every 6 (six) hours. 12 tablet 0   OVER THE COUNTER MEDICATION Pain reliever (acetaminophen  500 mg )  2 at hs     SUMAtriptan  (IMITREX ) 25 MG tablet TAKE 1 TABLET BY MOUTH EVERY 2 HOURS AS NEEDED FOR MIGRAINE. MAY REPEAT IN 2 HOURS IF HEADACHE PERSISTES OR RECURS 10 tablet 0   tiZANidine  (ZANAFLEX ) 4 MG tablet TAKE 1 TABLET(4 MG) BY MOUTH EVERY 6 HOURS AS NEEDED FOR MUSCLE SPASMS 30 tablet 0   Vitamin D , Ergocalciferol , (DRISDOL ) 1.25 MG (50000 UNIT) CAPS capsule TAKE 1 CAPSULE BY MOUTH EVERY 7  DAYS 4 capsule 2   No current facility-administered medications on file prior to visit.     Review of Systems     Objective:  There were no vitals filed for this visit. BP Readings from Last 3 Encounters:  11/05/23 (!) 140/67  09/23/23 129/80  03/19/23 131/68   Wt Readings from Last 3 Encounters:  11/05/23 138 lb (62.6 kg)  03/19/23 138 lb (62.6 kg)  01/29/23 139 lb 6.4 oz (63.2 kg)   There is no height or weight on file to calculate BMI.    Physical Exam     Lab Results  Component Value Date   WBC 4.8 11/05/2023   HGB 13.6 11/05/2023   HCT 40.0 11/05/2023   PLT 316 11/05/2023   GLUCOSE 79 11/05/2023   CHOL 162 02/14/2021   TRIG 190 (H) 02/14/2021   HDL 30 (L) 02/14/2021   LDLCALC 99 02/14/2021   ALT 12 11/05/2023   AST 16 11/05/2023   NA 137 11/05/2023   K 4.1 11/05/2023   CL 103 11/05/2023   CREATININE 0.70 11/05/2023   BUN 7 11/05/2023   CO2 22 11/05/2023   TSH 1.170 08/22/2021   HGBA1C 5.7 (A) 12/06/2018     Assessment & Plan:    See Problem List for Assessment and Plan of chronic  medical problems.   This encounter was created in error - please disregard.

## 2023-11-11 ENCOUNTER — Ambulatory Visit: Payer: Self-pay

## 2023-11-11 ENCOUNTER — Encounter: Payer: MEDICAID | Admitting: Internal Medicine

## 2023-11-11 DIAGNOSIS — R519 Headache, unspecified: Secondary | ICD-10-CM

## 2023-11-11 DIAGNOSIS — R42 Dizziness and giddiness: Secondary | ICD-10-CM

## 2023-11-11 DIAGNOSIS — S060X0D Concussion without loss of consciousness, subsequent encounter: Secondary | ICD-10-CM

## 2023-11-11 NOTE — Patient Instructions (Signed)
      Blood work was ordered.       Medications changes include :   None    A referral was ordered and someone will call you to schedule an appointment.     No follow-ups on file.

## 2023-11-11 NOTE — Telephone Encounter (Signed)
 Copied from CRM [id number]. Topic: Clinical - Red Word Triage >> Nov 11, 2023  2:53 PM Selinda RAMAN wrote: Red Word that prompted transfer to Nurse Triage: The patient called in stating a whole tray of energy drinks fell on her head on September 10th and she went to the ER for that. She said she had a concussion but she still continues to have headaches, dizziness and nausea. I will transfer her to E2C2 NT.

## 2023-11-11 NOTE — Telephone Encounter (Signed)
 FYI Only or Action Required?: FYI only for provider: appointment scheduled on 11/12/23 at 8am with PCP Oley.  Patient was last seen in primary care on 01/29/2023 by [name], NP.  Called Nurse Triage reporting Concussion.  Symptoms began 09/23/23.  Interventions attempted: Other: seen at [facility] on 09/23/23 after head injury; seen in ED 11/05/23 for worsening symptoms.  Symptoms are: headaches, intermittent blurry vision (not present now, gradual), mild to moderate dizziness intermittent gradually worsening.  Triage Disposition: See PCP When Office is Open (Within 3 Days)  Patient/caregiver understands and will follow disposition?: Yes           Reason for Disposition  [1] MODERATE dizziness (e.g., interferes with normal activities) AND [2] has been evaluated by doctor (or NP/PA) for this  Answer Assessment - Initial Assessment Questions Patient seen in ED on 11/05/23 at [facility].   1. DESCRIPTION: Describe your dizziness.     She states sometimes she is lightheaded and sometimes the room is spinning. She states sometimes when walking, feels like she will fall over.  2. LIGHTHEADED: Do you feel lightheaded? (e.g., somewhat faint, woozy, weak upon standing)     Yes.  3. VERTIGO: Do you feel like either you or the room is spinning or tilting? (i.e., vertigo)     Yes.  4. SEVERITY: How bad is it?  Do you feel like you are going to faint? Can you stand and walk?     She states every day she feels dizziness, it comes and goes. She experiences 5-6 episodes daily. They last 5-10 minutes long.  5. ONSET:  When did the dizziness begin?     September after head injury 09/23/23 she had a concussion. She states it was improving but has started to become more frequent x 2-3 weeks.  6. AGGRAVATING FACTORS: Does anything make it worse? (e.g., standing, change in head position)     Random. She states if she bends over, can occur while walking around.  7. HEART  RATE: Can you tell me your heart rate? How many beats in 15 seconds?  (Note: Not all patients can do this.)       N/A.  8. CAUSE: What do you think is causing the dizziness? (e.g., decreased fluids or food, diarrhea, emotional distress, heat exposure, new medicine, sudden standing, vomiting; unknown)     Concussion.  9. RECURRENT SYMPTOM: Have you had dizziness before? If Yes, ask: When was the last time? What happened that time?     Yes a month ago when she experienced the concussion.  10. OTHER SYMPTOMS: Do you have any other symptoms? (e.g., fever, chest pain, vomiting, diarrhea, bleeding)       Nausea and vomiting (last Thursday), headaches (8/10, has not taken any OTC medications to treat), blurry vision intermittent (not present now and states it does not happen often) since September. Denies chest pain, SOB, fever.  11. PREGNANCY: Is there any chance you are pregnant? When was your last menstrual period?       N/A.  Protocols used: Dizziness - Lightheadedness-A-AH

## 2023-11-12 ENCOUNTER — Ambulatory Visit (INDEPENDENT_AMBULATORY_CARE_PROVIDER_SITE_OTHER): Payer: Worker's Compensation | Admitting: Nurse Practitioner

## 2023-11-12 ENCOUNTER — Encounter: Payer: Self-pay | Admitting: Nurse Practitioner

## 2023-11-12 VITALS — BP 121/67 | HR 74 | Wt 136.8 lb

## 2023-11-12 DIAGNOSIS — S060X0D Concussion without loss of consciousness, subsequent encounter: Secondary | ICD-10-CM | POA: Diagnosis not present

## 2023-11-12 NOTE — Progress Notes (Signed)
 Subjective   Patient ID: Vanessa Butler, female    DOB: 03-Feb-1975, 48 y.o.   MRN: [id number]  Chief Complaint  Patient presents with   Concussion   Hospitalization Follow-up    Dizziness, nausea, vomiting, headaches, was given metoclopramide 10 mg and zofran . Also taking tylenol  and sumatriptan  for the headaches    Referring provider: [name], NP  Vanessa Butler is a 48 y.o. 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  Patient was seen in the ED a on 09/23/2023 for head injury.  She states that a pallet of drinks fell on her head at work.  She did return back to work after being diagnosed with concussion.  She states that her headaches became worse as she has been having dizziness.  She returned to the ED on 11/05/2023 and had a head CT performed.  This was overall negative and reassuring.  Patient was referred to sports medicine for concussion care.  She does have an appointment with them scheduled for November 10.  We will check repeat labs today.  We will write patient out of work until she can be cleared by sports medicine to return after concussion. Denies f/c/s, n/v/d, hemoptysis, PND, leg swelling Denies chest pain or edema       Allergies  Allergen Reactions   Doxycycline Nausea Only and Rash    Immunization History  Administered Date(s) Administered   PFIZER(Purple Top)SARS-COV-2 Vaccination 04/21/2019, 05/16/2019   Pneumococcal Polysaccharide-23 02/27/2016   Tdap 02/27/2016    Tobacco History: Social History   Tobacco Use  Smoking Status Every Day   Types: Cigarettes  Smokeless  Tobacco Never  Tobacco Comments   Smoked 1 ppd for last 4 years,prior to that  1/2  ppd x 28 yrs- 03/19/23 not a whole pack a day5-7 left in pack   Ready to quit: Not Answered Counseling given: Not Answered Tobacco comments: Smoked 1 ppd for last 4 years,prior to that  1/2  ppd x 28 yrs- 03/19/23 not a whole pack a day5-7 left in pack   Outpatient Encounter Medications as of 11/12/2023  Medication Sig   Melatonin 10 MG CAPS Take 20 mg by mouth at bedtime. 2 of 20 mg gummy at hs   metoCLOPramide (REGLAN) 10 MG tablet Take 1 tablet (10 mg total) by mouth every 6 (six) hours.   omeprazole  (PRILOSEC) 40 MG capsule TAKE 1 CAPSULE(40 MG) BY MOUTH IN THE MORNING AND AT BEDTIME   ondansetron  (ZOFRAN ) 4 MG tablet Take 1 tablet (4 mg total) by mouth every 6 (six) hours.   OVER THE COUNTER MEDICATION Pain reliever (acetaminophen  500 mg )  2 at hs   SUMAtriptan  (IMITREX ) 25 MG tablet TAKE 1 TABLET BY MOUTH EVERY 2 HOURS AS NEEDED FOR MIGRAINE. MAY REPEAT IN 2 HOURS IF HEADACHE PERSISTES OR RECURS   Vitamin D , Ergocalciferol , (DRISDOL ) 1.25 MG (50000 UNIT) CAPS capsule TAKE 1 CAPSULE BY MOUTH EVERY 7 DAYS   gabapentin  (NEURONTIN ) 300 MG capsule TAKE 1 CAPSULE(300 MG) BY MOUTH AT BEDTIME   tiZANidine  (ZANAFLEX ) 4 MG tablet TAKE 1 TABLET(4 MG) BY MOUTH EVERY 6  HOURS AS NEEDED FOR MUSCLE SPASMS   No facility-administered encounter medications on file as of 11/12/2023.    Review of Systems  Review of Systems  Constitutional: Negative.   HENT: Negative.    Cardiovascular: Negative.   Gastrointestinal: Negative.   Allergic/Immunologic: Negative.   Neurological:  Positive for dizziness and headaches.  Psychiatric/Behavioral: Negative.       Objective:   BP 121/67 (BP Location: Left Arm, Patient Position: Sitting, Cuff Size: Normal)   Pulse 74   Wt 136 lb 12.8 oz (62.1 kg)   SpO2 98%   BMI 26.72 kg/m   Wt Readings from Last 5 Encounters:  11/12/23 136 lb 12.8 oz (62.1 kg)  11/05/23 138 lb (62.6  kg)  03/19/23 138 lb (62.6 kg)  01/29/23 139 lb 6.4 oz (63.2 kg)  11/27/21 146 lb (66.2 kg)     Physical Exam Vitals and nursing note reviewed.  Constitutional:      General: She is not in acute distress.    Appearance: She is well-developed.  Cardiovascular:     Rate and Rhythm: Normal rate and regular rhythm.  Pulmonary:     Effort: Pulmonary effort is normal.     Breath sounds: Normal breath sounds.  Neurological:     Mental Status: She is alert and oriented to person, place, and time.       Assessment & Plan:   Concussion without loss of consciousness, subsequent encounter -     Comprehensive metabolic panel with GFR; Future     Return if symptoms worsen or fail to improve.   Bascom GORMAN Borer, NP 11/12/2023

## 2023-11-12 NOTE — Patient Instructions (Signed)
 Concussion, Adult  A concussion is a brain injury from a hard, direct hit (trauma) to the head or body. This direct hit causes the brain to shake quickly back and forth inside the skull. This can damage brain cells and cause chemical changes in the brain. A concussion may also be known as a mild traumatic brain injury (TBI). The effects of a concussion can be serious. If you have a concussion, you should be very careful to avoid having a second concussion. What are the causes? This condition is caused by: A direct hit to your head. Sudden movement of your body that causes your brain to move back and forth inside the skull, such as in a car crash. What are the signs or symptoms? The signs of a concussion can be hard to notice. Early on, they may be missed by you, family members, and health care providers. You may look fine on the outside but may act or feel differently. Every head injury is different. Symptoms are usually temporary but may last for days, weeks, or even months. Some symptoms appear right away, but other symptoms may not show up for hours or days. Physical symptoms Headaches. Dizziness and problems with coordination or balance. Sensitivity to light or noise. Nausea or vomiting. Tiredness (fatigue). Vision or hearing problems. Seizure. Mental and emotional symptoms Irritability or mood changes. Memory problems. Trouble concentrating, organizing, or making decisions. Changes in eating or sleeping patterns. Slowness in thinking, acting or reacting, speaking, or reading. Anxiety or depression. How is this diagnosed? This condition is diagnosed based on your symptoms and injury. You may also have tests, including: Imaging tests, such as a CT scan or an MRI. Neuropsychological tests. These measure your thinking, understanding, learning, and memory. How is this treated? Treatment for this condition includes: Stopping sports or activity if you are injured. Physical and  mental rest and careful observation, usually at home. Medicines to help with symptoms such as headaches, nausea, or difficulty sleeping. Referral to a concussion clinic or rehab center. Follow these instructions at home: Activity Limit activities that require a lot of thought or concentration, such as: Doing homework or job-related work. Watching TV. Using the computer or phone. Playing memory games and doing puzzles. Rest helps your brain heal. Make sure you: Get plenty of sleep. Most adults should get 7-9 hours of sleep each night. Rest during the day. Take naps or rest breaks when you feel tired. Avoid high-intensity exercise or physical activities that take a lot of effort. Stop any activity that worsens symptoms. Your health care provider may recommend light exercise such as walking. Do not do high-risk activities that could cause a second concussion, such as riding a bike or playing sports. Ask your health care provider when you can return to your normal activities, such as school, work, sports, and driving. Your ability to react may be slower after a brain injury. Never do these activities if you are dizzy. General instructions Take over-the-counter and prescription medicines only as told by your health care provider. Some medicines, such as blood thinners (anticoagulants) and aspirin, may increase the risk for complications, such as bleeding. Avoid taking opioid pain medicine while recovering from a concussion. Do not drink alcohol until your health care provider says you can. Drinking alcohol may slow your recovery and can put you at risk of further injury. Watch your symptoms and tell others around you to do the same. Complications sometimes occur after a concussion. Tell your work Production designer, theatre/television/film, teachers, Tax adviser, school  counselor, coach, or sports trainer about your injury, symptoms, and restrictions. See a mental health therapist if you feel anxious or depressed. Managing this  condition can be challenging. Keep all follow-up visits. Your health care provider will check on your recovery and give you a plan for returning to activities. How is this prevented? Avoiding another brain injury is very important. In rare cases, another injury can lead to permanent brain damage, brain swelling, or death. The risk of this is greatest during the first 7-10 days after a head injury. Avoid injuries by: Stopping activities that could lead to a second concussion, such as contact or recreational sports, until your health care provider says it is okay. Taking these actions once you have returned to sports or activities: Avoid plays or moves that can cause you to crash into another person. This is how most concussions occur. Follow the rules and be respectful of other players. Do not engage in violent or illegal plays. Getting regular exercise that includes strength and balance training. Wearing a properly fitting helmet during sports, biking, or other activities. Helmets can help protect you from serious skull and brain injuries, but they may not protect you from a concussion. Even when wearing a helmet, you should avoid being hit in the head. Where to find more information Centers for Disease Control and Prevention: TonerPromos.no Contact a health care provider if: Your symptoms do not improve or get worse. You have new symptoms. You have another injury. Your coordination gets worse. You have unusual behavior changes. Get help right away if: You have a severe or worsening headache. You have weakness or numbness in any part of your body, slurred speech, vision changes, or confusion. You vomit repeatedly. You lose consciousness, are sleepier than normal, or are difficult to wake up. You have a seizure. These symptoms may be an emergency. Get help right away. Call 911. Do not wait to see if the symptoms will go away. Do not drive yourself to the hospital. Also, get help right away  if: You have thoughts of hurting yourself or others. Take one of these steps if you feel like you may hurt yourself or others, or have thoughts about taking your own life: Go to your nearest emergency room. Call 911. Call the National Suicide Prevention Lifeline at 917 341 5060 or 988. This is open 24 hours a day. Text the Crisis Text Line at 2168600709. This information is not intended to replace advice given to you by your health care provider. Make sure you discuss any questions you have with your health care provider. Document Revised: 04/30/2023 Document Reviewed: 05/24/2021 Elsevier Patient Education  2025 [employer].

## 2023-11-13 ENCOUNTER — Ambulatory Visit: Payer: Self-pay | Admitting: Nurse Practitioner

## 2023-11-13 LAB — COMPREHENSIVE METABOLIC PANEL WITH GFR
ALT: 13 IU/L (ref 0–32)
AST: 13 IU/L (ref 0–40)
Albumin: 4.3 g/dL (ref 3.9–4.9)
Alkaline Phosphatase: 70 IU/L (ref 41–116)
BUN/Creatinine Ratio: 18 (ref 9–23)
BUN: 11 mg/dL (ref 6–24)
Bilirubin Total: 0.5 mg/dL (ref 0.0–1.2)
CO2: 21 mmol/L (ref 20–29)
Calcium: 9.1 mg/dL (ref 8.7–10.2)
Chloride: 104 mmol/L (ref 96–106)
Creatinine, Ser: 0.6 mg/dL (ref 0.57–1.00)
Globulin, Total: 2.9 g/dL (ref 1.5–4.5)
Glucose: 83 mg/dL (ref 70–99)
Potassium: 4.4 mmol/L (ref 3.5–5.2)
Sodium: 138 mmol/L (ref 134–144)
Total Protein: 7.2 g/dL (ref 6.0–8.5)
eGFR: 110 mL/min/1.73 (ref >59)

## 2023-11-18 ENCOUNTER — Inpatient Hospital Stay: Payer: Self-pay | Admitting: Nurse Practitioner

## 2023-11-18 ENCOUNTER — Telehealth: Payer: Self-pay

## 2023-11-18 NOTE — Telephone Encounter (Signed)
 Vanessa Butler left a message requesting to be seen by Dr. Fredirick regarding vaginal bleeding, cramping and leg pain. I called [name] back and let her know an appointment has been scheduled 12/14/23. I explained to her if she needs to been seen earlier by another provider that option is available, but she is requesting Dr.Pratt. I explained if she has any questions or concerns give the office a call or reach out through Mychart. She verbalized understanding. Patient states she has no further questions or concerns.  Devon, RN 11/18/23

## 2023-11-23 ENCOUNTER — Encounter: Payer: Self-pay | Admitting: Family Medicine

## 2023-11-23 ENCOUNTER — Ambulatory Visit: Payer: MEDICAID | Admitting: Family Medicine

## 2023-11-23 VITALS — BP 130/76 | HR 75 | Ht 60.0 in | Wt 140.0 lb

## 2023-11-23 DIAGNOSIS — S060X0A Concussion without loss of consciousness, initial encounter: Secondary | ICD-10-CM

## 2023-11-23 DIAGNOSIS — G43809 Other migraine, not intractable, without status migrainosus: Secondary | ICD-10-CM

## 2023-11-23 MED ORDER — NORTRIPTYLINE HCL 25 MG PO CAPS: 25.0000 mg | ORAL_CAPSULE | Freq: Every day | ORAL | 2 refills | Status: DC

## 2023-11-23 MED ORDER — SUMATRIPTAN SUCCINATE 25 MG PO TABS: 25.0000 mg | ORAL_TABLET | Freq: Every day | ORAL | 3 refills | Status: AC | PRN

## 2023-11-23 NOTE — Patient Instructions (Addendum)
 Thank you for coming in today.   Nortriptyline and Sumatriptan  prescribed.   Referral placed for Physical Therapy to [facility]   Work note provided - Return to work Wednesday with time off for appointment and sx exacerbation.   See you back in 3 weeks

## 2023-11-23 NOTE — Progress Notes (Signed)
 Subjective:   I, Leotis Batter, CMA acting as a scribe for Artist Lloyd, MD.  Chief Complaint: Vanessa Butler,  is a 48 y.o. female who presents for head injury after fall at work. Pt reports that she was pulling a cart of cases of energy drinks in a cart that was taller than her, and a case fell and hit her at the right frontal area.   Today, patient reports continued brain fog, forgetfulness, HA, and dizziness which is worse with prolonged ambulation.   She works in a naval architect.  She was out of work for a few days after the original injury and then her primary care provider took her out of work 8 or 9 days ago.  She notes that when she is working her symptoms are worse.  However she is worried about being able to afford being out of work.  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  Injury date: 09/23/23 Visit #:  1  History of Present Illness:   Concussion Self-Reported Symptom Score Symptoms rated on a scale 1-6, in last 24 hours  Headache: 4   Pressure in head: 2 Neck pain: 2 Nausea or vomiting: 0 Dizziness: 3  Blurred vision: 2  Balance problems: 2 Sensitivity to light:  2 Sensitivity to noise: 1 Feeling slowed down: 1 Feeling like "in a fog": 3 Don't feel right": 2 Difficulty concentrating: 2 Difficulty remembering: 3 Fatigue or low energy: 2 Confusion: 2 Drowsiness: 1 More emotional: 3 Irritability: 3 Sadness: 2 Nervous or anxious: 2 Trouble falling asleep: 4   Total # of Symptoms: 21/22 Total Symptom Score: 48/132  Tinnitus: Yes  Review of Systems: No fevers or chills    Review of History: GERD and neuropathy.  Objective:    Physical Examination Vitals:   11/23/23 0858  BP: 130/76  Pulse: 75  SpO2: 100%   MSK: Normal cervical motion Neuro: Alert and oriented normal coordination Psych: Normal speech thought process and affect.     Imaging:  CT Head Wo Contrast Result Date: 11/05/2023 EXAM: CT  HEAD WITHOUT CONTRAST 11/05/2023 04:00:55 PM TECHNIQUE: CT of the head was performed without the administration of intravenous contrast. Automated exposure control, iterative reconstruction, and/or weight based adjustment of the mA/kV was utilized to reduce the radiation dose to as low as reasonably achievable. COMPARISON: None available. CLINICAL HISTORY: Headache, increasing frequency or severity. FINDINGS: BRAIN AND VENTRICLES: No acute hemorrhage. No evidence of acute infarct. No hydrocephalus. No extra-axial collection. No mass effect or midline shift. ORBITS: No acute abnormality. SINUSES: No acute abnormality. SOFT TISSUES AND SKULL: No acute soft tissue abnormality. No skull fracture. IMPRESSION: 1. No acute intracranial abnormality. Electronically signed by: Lonni Necessary MD 11/05/2023 04:11 PM EDT RP Workstation: [id number]   I, Artist Lloyd, personally (independently) visualized and performed the interpretation of the images attached in this note.   Assessment and Plan   48 y.o. female with concussion occurring 2 months ago at work.  Symptoms are moderate to more severe.  Dominant symptom is headache and insomnia.  She does have some vestibular symptoms as well.  Plan for nortriptyline to help with headaches and refilled sumatriptan .  Additionally nortriptyline may be helpful at bedtime for insomnia.  Will refer to vestibular physical therapy.  Would be quite helpful.  We spent time talking about returning to work.  I think she would benefit from being out of work longer but she is experiencing financial pressure to return to work  sooner.  Plan to write back to return to work on Wednesday the 12th.  Will also need time off to attend physical therapy and doctors visits.  If her symptoms worsen we will pull her out of work.    Recheck in about 3 weeks    Action/Discussion: Reviewed diagnosis, management options, expected outcomes, and the reasons for scheduled and emergent  follow-up. Questions were adequately answered. Patient expressed verbal understanding and agreement with the following plan.     Patient Education: Reviewed with patient the risks (i.e, a repeat concussion, post-concussion syndrome, second-impact syndrome) of returning to play prior to complete resolution, and thoroughly reviewed the signs and symptoms of concussion.Reviewed need for complete resolution of all symptoms, with rest AND exertion, prior to return to play. Reviewed red flags for urgent medical evaluation: worsening symptoms, nausea/vomiting, intractable headache, musculoskeletal changes, focal neurological deficits. [facility] Concussion Care Plan, which clearly outlines the plans stated above, was given to patient.   Level of service: Total encounter time 30 minutes including face-to-face time with the patient and, reviewing past medical record, and charting on the date of service.        After Visit Summary printed out and provided to patient as appropriate.  The above documentation has been reviewed and is accurate and complete Artist Lloyd

## 2023-12-03 ENCOUNTER — Ambulatory Visit: Payer: MEDICAID | Admitting: Physical Therapy

## 2023-12-04 ENCOUNTER — Ambulatory Visit: Payer: Worker's Compensation | Attending: Family Medicine

## 2023-12-04 VITALS — BP 137/86 | HR 68

## 2023-12-04 DIAGNOSIS — R2681 Unsteadiness on feet: Secondary | ICD-10-CM | POA: Diagnosis present

## 2023-12-04 DIAGNOSIS — R42 Dizziness and giddiness: Secondary | ICD-10-CM | POA: Diagnosis present

## 2023-12-04 NOTE — Therapy (Signed)
 OUTPATIENT PHYSICAL THERAPY VESTIBULAR EVALUATION     Patient Name: Vanessa Butler MRN: [id number] DOB:Mar 26, 1975, 48 y.o., female Today's Date: 12/04/2023  END OF SESSION:  PT End of Session - 12/04/23 1406     Visit Number 1    Number of Visits 9    Date for Recertification  01/01/24    Authorization Type Worker's Comp    Authorization - Number of Visits 12    PT Start Time 1402    PT Stop Time 1449    PT Time Calculation (min) 47 min    Activity Tolerance Patient tolerated treatment well    Behavior During Therapy WFL for tasks assessed/performed          Past Medical History:  Diagnosis Date   Abnormal uterine bleeding (AUB)    GERD (gastroesophageal reflux disease)    Grieving    son murdered 90yrs ago per pt on 10-14-2021   Insomnia 11/2018   Skin disorder    seen at [facility] for keratoderma   Vitamin B12 deficiency 12/2018   Vitamin D  deficiency 12/2018   Past Surgical History:  Procedure Laterality Date   COLONOSCOPY  05/10/2021   DILATATION & CURETTAGE/HYSTEROSCOPY WITH MYOSURE N/A 09/21/2019   Procedure: DILATATION & CURETTAGE/HYSTEROSCOPY WITH MYOSURE;  Surgeon: Nicholaus Burnard HERO, MD;  Location: [facility];  Service: Gynecology;  Laterality: N/A;   DILITATION & CURRETTAGE/HYSTROSCOPY WITH HYDROTHERMAL ABLATION N/A 10/24/2021   Procedure: DILATATION & CURETTAGE/HYSTEROSCOPY WITH HYDROTHERMAL ABLATION;  Surgeon: Fredirick Glenys RAMAN, MD;  Location: [facility] [place];  Service: Gynecology;  Laterality: N/A;   FOOT SURGERY Left 2008   HAND SURGERY Left 11/2020   to remove skin disorder keratoderma   lower endo [facility]   07/12/2021   at [facility], benign neuroendocrine tumor  6 mm x 4 mm removed from rectum, repeat lower endo [facility]  in 1 year   TUBAL LIGATION     UPPER GASTROINTESTINAL ENDOSCOPY  05/10/2021   Patient Active Problem List   Diagnosis Date Noted   Positive self-administered antigen test for COVID-19 01/18/2022   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   Abnormal uterine bleeding (AUB) 08/22/2021   Insomnia 12/13/2018   Hyperglycemia 12/13/2018   Gastroesophageal reflux disease without esophagitis 05/20/2018   Tobacco dependence 02/27/2016   Neuropathy 02/27/2016    PCP: Bascom Borer, NP REFERRING PROVIDER: Artist Lloyd, MD  REFERRING DIAG:  G43.809 (ICD-10-CM) - Other migraine without status migrainosus, not intractable  S06.0X0A (ICD-10-CM) - Concussion without loss of consciousness, initial encounter    THERAPY DIAG:  Unsteadiness on feet - Plan: PT plan of care cert/re-cert  Dizziness and giddiness - Plan: PT plan of care cert/re-cert  ONSET DATE: 09/23/23   Rationale for Evaluation and Treatment: Rehabilitation  SUBJECTIVE:   SUBJECTIVE STATEMENT: Patient arrives to clinic alone, not using AD. She denies being in a dim-lit room. She sustained a concussion on 9/10 at work where she was hit in the head on the R frontal region. Since then, she will experience dizziness where she feels as though she may fall toward the R. Does endorse HA and nausea. Also reports that she feels as though she's constantly moving. Denies previous head injuries. Denies falls. Does not wear glasses at baseline, unsure of last eye exam date.  Pt accompanied by: self  PERTINENT HISTORY: GERD, vit B12 deficiency, vit D deficiency   PAIN:  Are you having pain? Yes: NPRS scale: 5/10 Pain location: R frontal HA Pain description: throbbing, progresses to eyes Aggravating factors:  unable to state Relieving factors: rx and sleep   PRECAUTIONS: Fall  WEIGHT BEARING RESTRICTIONS: No  FALLS: Has patient fallen in last 6 months? No  LIVING ENVIRONMENT: Lives with: lives with their family Lives in: House/apartment Stairs: Yes: Internal: flight steps; on right going up and External: 5-6 steps; on right going up Has following equipment at home: None  PLOF: Independent driving, working full time in warehouse  PATIENT GOALS:  to get back to my normal self  OBJECTIVE:  Note: Objective measures were completed at Evaluation unless otherwise noted.  DIAGNOSTIC FINDINGS: 11/05/23 CT Head  IMPRESSION: 1. No acute intracranial abnormality.  COGNITION: Overall cognitive status: Within functional limits for tasks assessed   SENSATION: WFL, but reports CTS in R UE   POSTURE:  rounded shoulders, forward head, increased thoracic kyphosis, posterior pelvic tilt, and flexed trunk   Cervical ROM:   WFL, painfree but slightly sore with R lateral rotation  STRENGTH: WFL   BED MOBILITY:  Intermittent dizziness if supine > sit too quickly, otherwise independent    GAIT: Gait pattern: WFL  PATIENT SURVEYS:  -Post Concussion Symptom Scale (PCSS)  -60/126 (15% normal) - Convergence Insufficiency Symptom Survey   -19/56  VESTIBULAR ASSESSMENT:  GENERAL OBSERVATION: NAD, no AD   SYMPTOM BEHAVIOR:  Subjective history: see above  Non-Vestibular symptoms: changes in vision, neck pain, headaches, tinnitus, nausea/vomiting, and migraine symptoms  Type of dizziness: Blurred Vision, Imbalance (Disequilibrium), Spinning/Vertigo, and weird  Frequency: multiple times/day   Duration: minutes  Aggravating factors: Spontaneous, Induced by position change: supine to sit, Induced by motion: bending down to the ground, turning body quickly, turning head quickly, and driving, Worse in the morning, Worse outside or in busy environment, Occurs when standing still , and Moving eyes  Relieving factors: medication, rest, slow movements, and avoid busy/distracting environments  Progression of symptoms: worse  OCULOMOTOR EXAM:  Ocular Alignment: normal  Ocular ROM: No Limitations  Spontaneous Nystagmus: absent  Gaze-Induced Nystagmus: absent  Smooth Pursuits: intact  Saccades: intact  Convergence/Divergence: to be assessed   VESTIBULAR - OCULAR REFLEX:   Slow VOR: to be assessed  VOR Cancellation: to be  assessed  Head-Impulse Test: to be assessed  Dynamic Visual Acuity: to be assessed   POSITIONAL TESTING: TBA PRN  MOTION SENSITIVITY:  Motion Sensitivity Quotient Intensity: 0 = none, 1 = Lightheaded, 2 = Mild, 3 = Moderate, 4 = Severe, 5 = Vomiting  Intensity  1. Sitting to supine   2. Supine to L side   3. Supine to R side   4. Supine to sitting   5. L Hallpike-Dix   6. Up from L    7. R Hallpike-Dix   8. Up from R    9. Sitting, head tipped to L knee   10. Head up from L knee   11. Sitting, head tipped to R knee   12. Head up from R knee   13. Sitting head turns x5   14.Sitting head nods x5   15. In stance, 180 turn to L    16. In stance, 180 turn to R     Vitals:   12/04/23 1428  BP: 137/86  Pulse: 68  TREATMENT  Initial HEP Pathophys of s/s of concussion    PATIENT EDUCATION: Education details: PT POC, exam findings, initial HEP, see above Person educated: Patient Education method: Explanation, Demonstration, and Handouts Education comprehension: verbalized understanding and needs further education  HOME EXERCISE PROGRAM: -Brock string 3x/day 3 sets of 30s  GOALS: Goals reviewed with patient? Yes  SHORT TERM GOALS: =LTG based on PT POC length   LONG TERM GOALS: Target date: 01/01/24  Pt will be independent with final HEP for improved symptom report and convergence   Baseline: to be updated Goal status: INITIAL  2.  Convergence goal  Baseline: to be formally assessed Goal status: INITIAL  3.  DVA goal  Baseline: to be assessed  Goal status: INITIAL  4.  Patient will improve score to </= 50/126 on the PCSS to demonstrate reduction in symptoms  Baseline: 60/126 Goal status: INITIAL  5.  Patient will improve score to </= 16 on the CISS to demonstrate an improvement in her convergence symptoms Baseline: 19/52  Goal  status: INITIAL  6. MSQ goal   Baseline: to be complete  Goal status: INITIAL   ASSESSMENT:  CLINICAL IMPRESSION: Patient is a 48 y.o. female who was seen today for physical therapy evaluation and treatment for impairments s/p concussion. 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 This results in frequent losses of balance and a sense of disequilibrium. Due to time constraints, unable to assess positional testing, but given subjective, BPPV is not likely a contributing factor, but given MOI, it is worth ruling out. Her PCSS score reflects moderate to severe symptoms of her concussion significantly impacting her ability to complete not only her MRADLs, but also her work-duties. She would benefit from skilled PT services to address the above mentioned deficits.   OBJECTIVE IMPAIRMENTS: decreased activity tolerance, decreased knowledge of condition, dizziness, impaired vision/preception, and pain.   ACTIVITY LIMITATIONS: carrying, lifting, bending, standing, hygiene/grooming, locomotion level, and caring for others  PARTICIPATION LIMITATIONS: meal prep, cleaning, interpersonal relationship, driving, shopping, community activity, occupation, and yard work  PERSONAL FACTORS: Age, Fitness, Past/current experiences, Profession, Sex, Social background, and Time since onset of injury/illness/exacerbation are also affecting patient's functional outcome.   REHAB POTENTIAL: Good  CLINICAL DECISION MAKING: Stable/uncomplicated  EVALUATION COMPLEXITY: Low   PLAN:  PT FREQUENCY: 2x/week  PT DURATION: 4 weeks  PLANNED INTERVENTIONS: 97164- PT Re-evaluation, 97750- Physical Performance Testing, 97110-Therapeutic exercises, 97530- Therapeutic activity, 97112- Neuromuscular re-education, 97535- Self Care, [id number]- Manual therapy, [phone]- Gait training, [phone]- Canalith repositioning, [phone]- Aquatic Therapy, Patient/Family education, Balance  training, Stair training, Vestibular training, Visual/preceptual remediation/compensation, Cognitive remediation, and DME instructions  PLAN FOR NEXT SESSION: DVA, convergence, MSQ + goal, begin with convergence exercises (sub symptom threshold) and monocular exercises (saccades, smooth pursuits, etc), likely not necessary to do accommodation given age    Delon DELENA Pop, PT Delon DELENA Pop, PT, DPT, CBIS, CFVRS  12/04/2023, 4:08 PM

## 2023-12-07 ENCOUNTER — Telehealth: Payer: Self-pay | Admitting: Family Medicine

## 2023-12-07 NOTE — Telephone Encounter (Signed)
 Patient called asking if someone could call her in regards to her work restrictions and paperwork. Her job asked if Dr Joane would be able to write her out of work from when she was seen here on 11/10 until her follow up on 12/2. She went back to work on 11/12 to see if she could do it, but she has been having a hard time.   She said the letter could be sent to her MyChart.

## 2023-12-08 NOTE — Telephone Encounter (Signed)
 Letter drafted and sent to my via MyChart.

## 2023-12-14 ENCOUNTER — Ambulatory Visit: Payer: MEDICAID | Admitting: Family Medicine

## 2023-12-15 ENCOUNTER — Ambulatory Visit: Payer: MEDICAID | Admitting: Family Medicine

## 2023-12-15 VITALS — BP 128/82 | HR 86 | Ht 60.0 in | Wt 140.0 lb

## 2023-12-15 DIAGNOSIS — R4189 Other symptoms and signs involving cognitive functions and awareness: Secondary | ICD-10-CM

## 2023-12-15 DIAGNOSIS — S060X0D Concussion without loss of consciousness, subsequent encounter: Secondary | ICD-10-CM

## 2023-12-15 DIAGNOSIS — G43809 Other migraine, not intractable, without status migrainosus: Secondary | ICD-10-CM

## 2023-12-15 MED ORDER — ONDANSETRON HCL 4 MG PO TABS: 4.0000 mg | ORAL_TABLET | Freq: Four times a day (QID) | ORAL | 0 refills | Status: AC

## 2023-12-15 MED ORDER — TOPIRAMATE 25 MG PO TABS: 25.0000 mg | ORAL_TABLET | Freq: Two times a day (BID) | ORAL | 2 refills | Status: AC

## 2023-12-15 NOTE — Progress Notes (Unsigned)
 Subjective:   I, Ileana Collet, PhD, LAT, ATC acting as a scribe for Artist Lloyd, MD.  Chief Complaint: Vanessa Butler,  is a 48 y.o. female who presents for 3-wk f/u concussion resulting from an injury at work. Pt was last seen by Dr. Lloyd on 11/23/23 and was prescribed nortriptyline  and her sumatriptan  was refilled. Work note was provided, later revised, and she was referred to vestibular therapy, completing 1 visit.  Today, pt reports some good days and bad. She c/o cont'd HA, nausea (2 days last week), dizziness, and difficulty remembering. She has been compliant w/ taking both rx.   Injury date: 09/23/23 Visit #: 2  History of Present Illness:   Concussion Self-Reported Symptom Score Symptoms rated on a scale 1-6, in last 24 hours  Headache: 3   Pressure in head: 5 Neck pain: 0 Nausea or vomiting: 4 Dizziness: 4  Blurred vision: 5  Balance problems: 6 Sensitivity to light:  5 Sensitivity to noise: 3 Feeling slowed down: 2 Feeling like "in a fog": 2 Don't feel right": 3 Difficulty concentrating: 5 Difficulty remembering: 6 Fatigue or low energy: 4 Confusion: 6 Drowsiness: 2 More emotional: 2 Irritability: 1 Sadness: 1 Nervous or anxious: 2 Trouble falling asleep: 5   Total # of Symptoms: 21/22 Total Symptom Score: 76/132  Previous Total # of Symptoms: 21/22 Previous Symptom Score: 48/132  Tinnitus: Yes- slight, bilat  Review of Systems: No fevers or chills    Review of History: Neuropathy  Objective:    Physical Examination Vitals:   12/15/23 1419  BP: 128/82  Pulse: 86  SpO2: 98%   MSK: Normal cervical motion Neuro: Alert and oriented normal coordination impaired balance. Psych: Normal thought process.  Speech is somewhat delayed with some word finding difficulty.    Assessment and Plan   48 y.o. female with concussion/postconcussion syndrome.  Slight improvement.  Unfortunately nortriptyline  has not been helpful.  Will try switching  to Topamax for headache.  She is scheduled to start vestibular physical therapy shortly which should be helpful.  She does have some cognitive dysfunction.  Will add cognitive rehab as part of speech therapy as well.  Patient is not currently able to work.  Remain out of work until at least recheck in about 1 month.       Action/Discussion: Reviewed diagnosis, management options, expected outcomes, and the reasons for scheduled and emergent follow-up. Questions were adequately answered. Patient expressed verbal understanding and agreement with the following plan.     Patient Education: Reviewed with patient the risks (i.e, a repeat concussion, post-concussion syndrome, second-impact syndrome) of returning to play prior to complete resolution, and thoroughly reviewed the signs and symptoms of concussion.Reviewed need for complete resolution of all symptoms, with rest AND exertion, prior to return to play. Reviewed red flags for urgent medical evaluation: worsening symptoms, nausea/vomiting, intractable headache, musculoskeletal changes, focal neurological deficits. [facility] Concussion Care Plan, which clearly outlines the plans stated above, was given to patient.   Level of service: Total encounter time 30 minutes including face-to-face time with the patient and, reviewing past medical record, and charting on the date of service.        After Visit Summary printed out and provided to patient as appropriate.  The above documentation has been reviewed and is accurate and complete Artist Lloyd

## 2023-12-15 NOTE — Patient Instructions (Signed)
 Thank you for coming in today.   Start Topamax  25 mg twice daily, rx sent to your pharmacy.   Zofran  refilled today.   Referral placed for Speech Therapy.   Work note provided - remain out of work for 6 weeks.  See you back in 1 month.

## 2023-12-18 ENCOUNTER — Ambulatory Visit: Payer: MEDICAID | Admitting: Physical Therapy

## 2023-12-18 ENCOUNTER — Encounter: Payer: Self-pay | Admitting: Physical Therapy

## 2023-12-18 VITALS — BP 128/85 | HR 89

## 2023-12-18 DIAGNOSIS — R42 Dizziness and giddiness: Secondary | ICD-10-CM | POA: Diagnosis present

## 2023-12-18 DIAGNOSIS — R2681 Unsteadiness on feet: Secondary | ICD-10-CM | POA: Insufficient documentation

## 2023-12-18 NOTE — Therapy (Signed)
 OUTPATIENT PHYSICAL THERAPY VESTIBULAR TREATMENT     Patient Name: Vanessa Butler MRN: [id number] DOB:1975/03/01, 48 y.o., female Today's Date: 12/18/2023  END OF SESSION:  PT End of Session - 12/18/23 1232     Visit Number 2    Number of Visits 9    Date for Recertification  01/01/24    Authorization Type Worker's Comp    Authorization - Number of Visits 12    PT Start Time 1231    PT Stop Time 1313    PT Time Calculation (min) 42 min    Activity Tolerance Patient tolerated treatment well   limited by dizziness   Behavior During Therapy WFL for tasks assessed/performed   tearful         Past Medical History:  Diagnosis Date   Abnormal uterine bleeding (AUB)    GERD (gastroesophageal reflux disease)    Grieving    son murdered 75yrs ago per pt on 10-14-2021   Insomnia 11/2018   Skin disorder    seen at [facility] for keratoderma   Vitamin B12 deficiency 12/2018   Vitamin D  deficiency 12/2018   Past Surgical History:  Procedure Laterality Date   COLONOSCOPY  05/10/2021   DILATATION & CURETTAGE/HYSTEROSCOPY WITH MYOSURE N/A 09/21/2019   Procedure: DILATATION & CURETTAGE/HYSTEROSCOPY WITH MYOSURE;  Surgeon: Nicholaus Burnard HERO, MD;  Location: [facility];  Service: Gynecology;  Laterality: N/A;   DILITATION & CURRETTAGE/HYSTROSCOPY WITH HYDROTHERMAL ABLATION N/A 10/24/2021   Procedure: DILATATION & CURETTAGE/HYSTEROSCOPY WITH HYDROTHERMAL ABLATION;  Surgeon: Fredirick Glenys RAMAN, MD;  Location: [facility] [place];  Service: Gynecology;  Laterality: N/A;   FOOT SURGERY Left 2008   HAND SURGERY Left 11/2020   to remove skin disorder keratoderma   lower endo [facility]   07/12/2021   at [facility], benign neuroendocrine tumor  6 mm x 4 mm removed from rectum, repeat lower endo [facility]  in 1 year   TUBAL LIGATION     UPPER GASTROINTESTINAL ENDOSCOPY  05/10/2021   Patient Active Problem List   Diagnosis Date Noted   Positive self-administered antigen test for COVID-19  01/18/2022   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   Abnormal uterine bleeding (AUB) 08/22/2021   Insomnia 12/13/2018   Hyperglycemia 12/13/2018   Gastroesophageal reflux disease without esophagitis 05/20/2018   Tobacco dependence 02/27/2016   Neuropathy 02/27/2016    PCP: Bascom Borer, NP REFERRING PROVIDER: Artist Lloyd, MD  REFERRING DIAG:  G43.809 (ICD-10-CM) - Other migraine without status migrainosus, not intractable  S06.0X0A (ICD-10-CM) - Concussion without loss of consciousness, initial encounter    THERAPY DIAG:  Unsteadiness on feet  Dizziness and giddiness  ONSET DATE: 09/23/23   Rationale for Evaluation and Treatment: Rehabilitation  SUBJECTIVE:   SUBJECTIVE STATEMENT: Has not returned back to work. Still having the dizziness, minor headaches, a little nausea. Getting up real fast or bending over will make her dizzy. Sometimes gets dizzy in the shower. Lucious string exercises are going well. Notes every morning when she gets up, her head feels tight. But once she gets up and moves around then it'll go away. Started taking nortriptyline .   Pt accompanied by: self  PERTINENT HISTORY: GERD, vit B12 deficiency, vit D deficiency   PAIN:  Are you having pain? Yes: NPRS scale: 3.5-4/10 Pain location: R frontal HA Pain description: throbbing, progresses to eyes Aggravating factors: unable to state Relieving factors: rx and sleep   PRECAUTIONS: Fall  WEIGHT BEARING RESTRICTIONS: No  FALLS: Has patient fallen in last 6 months? No  LIVING ENVIRONMENT: Lives with: lives with their family Lives in: House/apartment Stairs: Yes: Internal: flight steps; on right going up and External: 5-6 steps; on right going up Has following equipment at home: None  PLOF: Independent driving, working full time in warehouse  PATIENT GOALS: to get back to my normal self  OBJECTIVE:  Note: Objective measures were completed at Evaluation unless otherwise noted.  DIAGNOSTIC  FINDINGS: 11/05/23 CT Head  IMPRESSION: 1. No acute intracranial abnormality.  COGNITION: Overall cognitive status: Within functional limits for tasks assessed   SENSATION: WFL, but reports CTS in R UE   POSTURE:  rounded shoulders, forward head, increased thoracic kyphosis, posterior pelvic tilt, and flexed trunk   Cervical ROM:   WFL, painfree but slightly sore with R lateral rotation  STRENGTH: WFL   BED MOBILITY:  Intermittent dizziness if supine > sit too quickly, otherwise independent    GAIT: Gait pattern: WFL  PATIENT SURVEYS:  -Post Concussion Symptom Scale (PCSS)  -60/126 (15% normal) - Convergence Insufficiency Symptom Survey   -19/56  VESTIBULAR ASSESSMENT:  GENERAL OBSERVATION: NAD, no AD   SYMPTOM BEHAVIOR:  Subjective history: see above  Non-Vestibular symptoms: changes in vision, neck pain, headaches, tinnitus, nausea/vomiting, and migraine symptoms  Type of dizziness: Blurred Vision, Imbalance (Disequilibrium), Spinning/Vertigo, and weird  Frequency: multiple times/day   Duration: minutes  Aggravating factors: Spontaneous, Induced by position change: supine to sit, Induced by motion: bending down to the ground, turning body quickly, turning head quickly, and driving, Worse in the morning, Worse outside or in busy environment, Occurs when standing still , and Moving eyes  Relieving factors: medication, rest, slow movements, and avoid busy/distracting environments  Progression of symptoms: worse  OCULOMOTOR EXAM:  Ocular Alignment: normal  Ocular ROM: No Limitations  Spontaneous Nystagmus: absent  Gaze-Induced Nystagmus: absent  Smooth Pursuits: intact  Saccades: intact  Convergence/Divergence: to be assessed   VESTIBULAR - OCULAR REFLEX:   Slow VOR: to be assessed  VOR Cancellation: to be assessed  Head-Impulse Test: to be assessed  Dynamic Visual Acuity: to be assessed   POSITIONAL TESTING: TBA PRN  MOTION SENSITIVITY:  Motion  Sensitivity Quotient Intensity: 0 = none, 1 = Lightheaded, 2 = Mild, 3 = Moderate, 4 = Severe, 5 = Vomiting  Intensity  1. Sitting to supine   2. Supine to L side   3. Supine to R side   4. Supine to sitting   5. L Hallpike-Dix   6. Up from L    7. R Hallpike-Dix   8. Up from R    9. Sitting, head tipped to L knee   10. Head up from L knee   11. Sitting, head tipped to R knee   12. Head up from R knee   13. Sitting head turns x5   14.Sitting head nods x5   15. In stance, 180 turn to L    16. In stance, 180 turn to R  TREATMENT   Therapeutic Activity: Vitals:   12/18/23 1238  BP: 128/85  Pulse: 89    Convergence: ~5 cm  VESTIBULAR - OCULAR REFLEX:   Slow VOR: WNL, mild dizziness   VOR Cancellation: WNL, felt like she was on a boat   Head-Impulse Test: positive to R and L, more noticeable on L, but pt felt more symptoms on the R, felt that body was moving afterwards  Dynamic Visual Acuity: Static: Line 8  Dynamic: pt unable to read line, stopped pt and pt felt incr dizziness afterwards and needing to sit down  Pt needing a prolonged rest break due to feeling tearful and emotional.  Provided education and reassurance that pt's sx after a concussion are normal and it is common to have emotion dysregulation after concussion as well as cognitive deficits. Educated pt got a referral for speech therapy and can get scheduled for that today.   NMR: Gaze Adaptation: x1 Viewing Horizontal: Position: Seated, Time: 30 seconds, Reps: 2, and Comment: pt reporting mild dizziness, needing rest break between reps to allow for sx to subside  x1 Viewing Vertical:  Position: Seated, Time: 30 seconds, Reps: 2, and Comment: pt reports no sx in vertical direction   Reviewed Brock string exercise given at eval, working on jumps from bead to bead, pt reporting still  having difficulty focusing, educated on continuing to work on this at home   PATIENT EDUCATION: Education details: see above, clinical findings with VOR, concussion symptoms, getting scheduled for speech therapy eval, seated VOR x1 in horizontal direction to HEP  Person educated: Patient Education method: Programmer, Multimedia, Demonstration, and Handouts Education comprehension: verbalized understanding and needs further education  HOME EXERCISE PROGRAM: -Lucious string 3x/day 3 sets of 30s  Seated horizontal VOR x1 30 seconds   GOALS: Goals reviewed with patient? Yes  SHORT TERM GOALS: =LTG based on PT POC length   LONG TERM GOALS: Target date: 01/01/24  Pt will be independent with final HEP for improved symptom report and convergence   Baseline: to be updated Goal status: INITIAL  2.  Convergence goal  Baseline: ~5 cm, goal not needed  Goal status: N/A/  3.  DVA goal  Baseline: to be assessed  Goal status: INITIAL  4.  Patient will improve score to </= 50/126 on the PCSS to demonstrate reduction in symptoms  Baseline: 60/126 Goal status: INITIAL  5.  Patient will improve score to </= 16 on the CISS to demonstrate an improvement in her convergence symptoms Baseline: 19/52  Goal status: INITIAL  6. MSQ goal   Baseline: to be complete  Goal status: INITIAL   ASSESSMENT:  CLINICAL IMPRESSION: Today's skilled session focused on further assessment of vestibular/oculomotor testing. Pt with 5 cm with convergence which is WNL, but when performing Brock string, pt with difficulty trying to keep eyes focused on the beads. With VOR, pt with incr dizziness with slow VOR and VOR cancellation and pt with bilateral positive HIT, indicating impaired VOR. Attempted to perform DVA, with pt unable to read a line and having incr sx afterwards with pt needing to sit down and became tearful. Prolonged rest break needed and then initiated seated VOR x1  in the horizontal direction to HEP. Pt more  challenged with horizontal than the vertical direction. Will continue per POC.   OBJECTIVE IMPAIRMENTS: decreased activity tolerance, decreased knowledge of condition, dizziness, impaired vision/preception, and pain.   ACTIVITY LIMITATIONS: carrying, lifting, bending, standing, hygiene/grooming, locomotion level, and caring for others  PARTICIPATION  LIMITATIONS: meal prep, cleaning, interpersonal relationship, driving, shopping, community activity, occupation, and yard work  PERSONAL FACTORS: Age, Fitness, Past/current experiences, Profession, Sex, Social background, and Time since onset of injury/illness/exacerbation are also affecting patient's functional outcome.   REHAB POTENTIAL: Good  CLINICAL DECISION MAKING: Stable/uncomplicated  EVALUATION COMPLEXITY: Low   PLAN:  PT FREQUENCY: 2x/week  PT DURATION: 4 weeks  PLANNED INTERVENTIONS: 97164- PT Re-evaluation, 97750- Physical Performance Testing, 97110-Therapeutic exercises, 97530- Therapeutic activity, [id number]- Neuromuscular re-education, 97535- Self Care, [id number]- Manual therapy, [phone]- Gait training, [phone]- Canalith repositioning, [phone]- Aquatic Therapy, Patient/Family education, Balance training, Stair training, Vestibular training, Visual/preceptual remediation/compensation, Cognitive remediation, and DME instructions  PLAN FOR NEXT SESSION: DVA when able, MSQ + goal, check positional testing  begin with convergence exercises (sub symptom threshold) and monocular exercises (saccades, smooth pursuits, etc), progress VOR tasks with pt more symptomatic in horizontal direction    Sheffield Senate, PT, DPT 12/18/23 2:42 PM

## 2023-12-18 NOTE — Patient Instructions (Signed)
 Gaze Stabilization: Sitting    Keeping eyes on target on wall a few feet away, tilt head down 15-30 and move head side to side for __30__ seconds.   Perform 3 reps. Do _2___ sessions per day. Use a plain background.  Gaze Stabilization: Tip Card  1.Target must remain in focus, not blurry, and appear stationary while head is in motion. 2.Perform exercises with small head movements (45 to either side of midline). 3.Increase speed of head motion so long as target is in focus. 4.If you wear eyeglasses, be sure you can see target through lens (therapist will give specific instructions for bifocal / progressive lenses). 5.These exercises may provoke dizziness or nausea. Work through these symptoms. If too dizzy, slow head movement slightly. Rest between each exercise. 6.Exercises demand concentration; avoid distractions. 7.For safety, perform standing exercises close to a counter, wall, corner, or next to someone.

## 2023-12-21 ENCOUNTER — Ambulatory Visit: Payer: Worker's Compensation

## 2023-12-22 ENCOUNTER — Ambulatory Visit: Payer: Worker's Compensation | Admitting: Physical Therapy

## 2023-12-23 ENCOUNTER — Ambulatory Visit: Payer: Worker's Compensation

## 2023-12-25 ENCOUNTER — Ambulatory Visit: Payer: Worker's Compensation

## 2023-12-28 ENCOUNTER — Encounter: Payer: Self-pay | Admitting: Physical Therapy

## 2023-12-28 ENCOUNTER — Ambulatory Visit: Payer: Worker's Compensation | Admitting: Physical Therapy

## 2023-12-28 VITALS — BP 126/83 | HR 87

## 2023-12-28 DIAGNOSIS — R42 Dizziness and giddiness: Secondary | ICD-10-CM | POA: Diagnosis present

## 2023-12-28 DIAGNOSIS — H8111 Benign paroxysmal vertigo, right ear: Secondary | ICD-10-CM | POA: Insufficient documentation

## 2023-12-28 DIAGNOSIS — R2681 Unsteadiness on feet: Secondary | ICD-10-CM

## 2023-12-28 DIAGNOSIS — R41841 Cognitive communication deficit: Secondary | ICD-10-CM | POA: Diagnosis present

## 2023-12-28 NOTE — Therapy (Signed)
 OUTPATIENT PHYSICAL THERAPY VESTIBULAR TREATMENT     Patient Name: Vanessa Butler MRN: [id number] DOB:1975-12-26, 48 y.o., female Today's Date: 12/28/2023  END OF SESSION:  PT End of Session - 12/28/23 1313     Visit Number 3    Number of Visits 9    Date for Recertification  01/01/24    Authorization Type Worker's Comp    Authorization - Visit Number 2    Authorization - Number of Visits 12    PT Start Time 1313    PT Stop Time 1348   full time not used due to BPPV tx   PT Time Calculation (min) 35 min    Activity Tolerance Patient tolerated treatment well    Behavior During Therapy WFL for tasks assessed/performed          Past Medical History:  Diagnosis Date   Abnormal uterine bleeding (AUB)    GERD (gastroesophageal reflux disease)    Grieving    son murdered 38yrs ago per pt on 10-14-2021   Insomnia 11/2018   Skin disorder    seen at [facility] for keratoderma   Vitamin B12 deficiency 12/2018   Vitamin D  deficiency 12/2018   Past Surgical History:  Procedure Laterality Date   COLONOSCOPY  05/10/2021   DILATATION & CURETTAGE/HYSTEROSCOPY WITH MYOSURE N/A 09/21/2019   Procedure: DILATATION & CURETTAGE/HYSTEROSCOPY WITH MYOSURE;  Surgeon: Nicholaus Burnard HERO, MD;  Location: [facility];  Service: Gynecology;  Laterality: N/A;   DILITATION & CURRETTAGE/HYSTROSCOPY WITH HYDROTHERMAL ABLATION N/A 10/24/2021   Procedure: DILATATION & CURETTAGE/HYSTEROSCOPY WITH HYDROTHERMAL ABLATION;  Surgeon: Fredirick Glenys RAMAN, MD;  Location: [facility];  Service: Gynecology;  Laterality: N/A;   FOOT SURGERY Left 2008   HAND SURGERY Left 11/2020   to remove skin disorder keratoderma   lower endo [facility]   07/12/2021   at [facility], benign neuroendocrine tumor  6 mm x 4 mm removed from rectum, repeat lower endo us  in 1 year   TUBAL LIGATION     UPPER GASTROINTESTINAL ENDOSCOPY  05/10/2021   Patient Active Problem List   Diagnosis Date Noted   Positive  self-administered antigen test for COVID-19 01/18/2022   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   Abnormal uterine bleeding (AUB) 08/22/2021   Insomnia 12/13/2018   Hyperglycemia 12/13/2018   Gastroesophageal reflux disease without esophagitis 05/20/2018   Tobacco dependence 02/27/2016   Neuropathy 02/27/2016    PCP: Bascom Borer, NP REFERRING PROVIDER: Artist Lloyd, MD  REFERRING DIAG:  G43.809 (ICD-10-CM) - Other migraine without status migrainosus, not intractable  S06.0X0A (ICD-10-CM) - Concussion without loss of consciousness, initial encounter    THERAPY DIAG:  Unsteadiness on feet  Dizziness and giddiness  ONSET DATE: 09/23/23   Rationale for Evaluation and Treatment: Rehabilitation  SUBJECTIVE:   SUBJECTIVE STATEMENT: Reports doing good, has been trying the exercises. Feels like the dizziness is getting a little less. Feels like headaches are calming down some. Still has them, but they are not as bad. When laying down on the R side, the room will spin as long as she is laying on that side. When she can't take it anymore will switch sides and lay on her back for a minute.    Pt accompanied by: self  PERTINENT HISTORY: GERD, vit B12 deficiency, vit D deficiency   PAIN:  Are you having pain? None  Vitals:   12/28/23 1319  BP: 126/83  Pulse: 87     PRECAUTIONS: Fall  WEIGHT BEARING RESTRICTIONS: No  FALLS: Has  patient fallen in last 6 months? No  LIVING ENVIRONMENT: Lives with: lives with their family Lives in: House/apartment Stairs: Yes: Internal: flight steps; on right going up and External: 5-6 steps; on right going up Has following equipment at home: None  PLOF: Independent driving, working full time in warehouse  PATIENT GOALS: to get back to my normal self  OBJECTIVE:  Note: Objective measures were completed at Evaluation unless otherwise noted.  DIAGNOSTIC FINDINGS: 11/05/23 CT Head  IMPRESSION: 1. No acute intracranial  abnormality.  COGNITION: Overall cognitive status: Within functional limits for tasks assessed   SENSATION: WFL, but reports CTS in R UE   POSTURE:  rounded shoulders, forward head, increased thoracic kyphosis, posterior pelvic tilt, and flexed trunk   Cervical ROM:   WFL, painfree but slightly sore with R lateral rotation  STRENGTH: WFL   BED MOBILITY:  Intermittent dizziness if supine > sit too quickly, otherwise independent    GAIT: Gait pattern: WFL  PATIENT SURVEYS:  -Post Concussion Symptom Scale (PCSS)  -60/126 (15% normal) - Convergence Insufficiency Symptom Survey   -19/56  VESTIBULAR ASSESSMENT:  GENERAL OBSERVATION: NAD, no AD   SYMPTOM BEHAVIOR:  Subjective history: see above  Non-Vestibular symptoms: changes in vision, neck pain, headaches, tinnitus, nausea/vomiting, and migraine symptoms  Type of dizziness: Blurred Vision, Imbalance (Disequilibrium), Spinning/Vertigo, and weird  Frequency: multiple times/day   Duration: minutes  Aggravating factors: Spontaneous, Induced by position change: supine to sit, Induced by motion: bending down to the ground, turning body quickly, turning head quickly, and driving, Worse in the morning, Worse outside or in busy environment, Occurs when standing still , and Moving eyes  Relieving factors: medication, rest, slow movements, and avoid busy/distracting environments  Progression of symptoms: worse  OCULOMOTOR EXAM:  Ocular Alignment: normal  Ocular ROM: No Limitations  Spontaneous Nystagmus: absent  Gaze-Induced Nystagmus: absent  Smooth Pursuits: intact  Saccades: intact  Convergence/Divergence: to be assessed                                                                                                                            TREATMENT   Therapeutic Activity: Vitals:   12/28/23 1319  BP: 126/83  Pulse: 87   POSITIONAL TESTING: Right Dix-Hallpike: upbeating, right nystagmus and very mild and low  amplitude, lasting approx 30 seconds  Canalith Repositioning: Epley Right: Number of Reps: 2, Response to Treatment: comment: BPPV resolved after 2 reps, and Comment: No nystagmus or dizziness after re-assessing after 2 reps   Provided education regarding BPPV etiology, symptoms and how it can contribute to pt feeling more off balance, purpose of Epley maneuver.   Educated for pt to try to sleep on her L side for the next 1-2 nights  Pt asking if any of her medications are causing incr urinary frequency. PT looked up pt's medication list and unsure if some of the GI medications she is on may be causing this. Encouraged pt to follow-up with her  PCP regarding this   PATIENT EDUCATION: Education details: see above, adding a couple more appts   Person educated: Patient Education method: Programmer, Multimedia, Demonstration, Verbal cues, and Handouts Education comprehension: verbalized understanding and needs further education  HOME EXERCISE PROGRAM: -Brock string 3x/day 3 sets of 30s  Seated horizontal VOR x1 30 seconds   GOALS: Goals reviewed with patient? Yes  SHORT TERM GOALS: =LTG based on PT POC length   LONG TERM GOALS: Target date: 01/01/24  Pt will be independent with final HEP for improved symptom report and convergence   Baseline: to be updated Goal status: INITIAL  2.  Convergence goal  Baseline: ~5 cm, goal not needed  Goal status: N/A/  3.  DVA goal  Baseline: to be assessed  Goal status: INITIAL  4.  Patient will improve score to </= 50/126 on the PCSS to demonstrate reduction in symptoms  Baseline: 60/126 Goal status: INITIAL  5.  Patient will improve score to </= 16 on the CISS to demonstrate an improvement in her convergence symptoms Baseline: 19/52  Goal status: INITIAL  6. MSQ goal   Baseline: to be complete  Goal status: INITIAL   ASSESSMENT:  CLINICAL IMPRESSION: Today's skilled session focused on positional testing as pt reporting a spinning dizziness  when rolling to her R as well as bending over. Pt demonstrated (+) R posterior canalithiasis with very mild low amplitude nystagmus and pt reporting spinning. Performed the Epley maneuver 2 times with pt demonstrating resolution after 2 reps. Provided BPPV education and discussed that (+) BPPV can also contribute to pt's balance impairments. Will re-assess at next session and treat as needed. Will continue per POC.   OBJECTIVE IMPAIRMENTS: decreased activity tolerance, decreased knowledge of condition, dizziness, impaired vision/preception, and pain.   ACTIVITY LIMITATIONS: carrying, lifting, bending, standing, hygiene/grooming, locomotion level, and caring for others  PARTICIPATION LIMITATIONS: meal prep, cleaning, interpersonal relationship, driving, shopping, community activity, occupation, and yard work  PERSONAL FACTORS: Age, Fitness, Past/current experiences, Profession, Sex, Social background, and Time since onset of injury/illness/exacerbation are also affecting patient's functional outcome.   REHAB POTENTIAL: Good  CLINICAL DECISION MAKING: Stable/uncomplicated  EVALUATION COMPLEXITY: Low   PLAN:  PT FREQUENCY: 2x/week  PT DURATION: 4 weeks  PLANNED INTERVENTIONS: 97164- PT Re-evaluation, 97750- Physical Performance Testing, 97110-Therapeutic exercises, 97530- Therapeutic activity, [id number]- Neuromuscular re-education, 97535- Self Care, [id number]- Manual therapy, [phone]- Gait training, [phone]- Canalith repositioning, [phone]- Aquatic Therapy, Patient/Family education, Balance training, Stair training, Vestibular training, Visual/preceptual remediation/compensation, Cognitive remediation, and DME instructions  PLAN FOR NEXT SESSION: WILL NEED TO HAVE GOALS CHECKED AND RE-CERT PERFORMED  Re-assess R posterior canal BPPV and treat as needed  look at [facility] and write goal. begin with convergence exercises (sub symptom threshold) and monocular exercises (saccades, smooth pursuits, etc), progress  VOR tasks with pt more symptomatic in horizontal direction    Sheffield Senate, PT, DPT 12/28/2023 1:56 PM

## 2023-12-30 ENCOUNTER — Ambulatory Visit: Payer: MEDICAID

## 2024-01-04 ENCOUNTER — Encounter: Payer: Self-pay | Admitting: Physical Therapy

## 2024-01-04 ENCOUNTER — Ambulatory Visit: Payer: Worker's Compensation | Admitting: Physical Therapy

## 2024-01-04 VITALS — BP 131/72 | HR 78

## 2024-01-04 DIAGNOSIS — R42 Dizziness and giddiness: Secondary | ICD-10-CM

## 2024-01-04 DIAGNOSIS — R2681 Unsteadiness on feet: Secondary | ICD-10-CM | POA: Diagnosis not present

## 2024-01-04 DIAGNOSIS — H8111 Benign paroxysmal vertigo, right ear: Secondary | ICD-10-CM

## 2024-01-04 NOTE — Therapy (Signed)
 " OUTPATIENT PHYSICAL THERAPY VESTIBULAR TREATMENT/RE-CERT     Patient Name: Vanessa Butler MRN: [id number] DOB:1975-05-14, 48 y.o., female Today's Date: 01/04/2024  END OF SESSION:  PT End of Session - 01/04/24 1445     Visit Number 4    Number of Visits 12    Date for Recertification  02/03/24   per re-cert on 87/77   Authorization Type Worker's Comp    Authorization - Visit Number 3    Authorization - Number of Visits 12    PT Start Time 1444    PT Stop Time 1522    PT Time Calculation (min) 38 min    Activity Tolerance Patient tolerated treatment well    Behavior During Therapy WFL for tasks assessed/performed          Past Medical History:  Diagnosis Date   Abnormal uterine bleeding (AUB)    GERD (gastroesophageal reflux disease)    Grieving    son murdered 34yrs ago per pt on 10-14-2021   Insomnia 11/2018   Skin disorder    seen at [facility] for keratoderma   Vitamin B12 deficiency 12/2018   Vitamin D  deficiency 12/2018   Past Surgical History:  Procedure Laterality Date   COLONOSCOPY  05/10/2021   DILATATION & CURETTAGE/HYSTEROSCOPY WITH MYOSURE N/A 09/21/2019   Procedure: DILATATION & CURETTAGE/HYSTEROSCOPY WITH MYOSURE;  Surgeon: Nicholaus Burnard HERO, MD;  Location: [facility];  Service: Gynecology;  Laterality: N/A;   DILITATION & CURRETTAGE/HYSTROSCOPY WITH HYDROTHERMAL ABLATION N/A 10/24/2021   Procedure: DILATATION & CURETTAGE/HYSTEROSCOPY WITH HYDROTHERMAL ABLATION;  Surgeon: Fredirick Glenys RAMAN, MD;  Location: [facility];  Service: Gynecology;  Laterality: N/A;   FOOT SURGERY Left 2008   HAND SURGERY Left 11/2020   to remove skin disorder keratoderma   lower endo [facility]   07/12/2021   at [facility], benign neuroendocrine tumor  6 mm x 4 mm removed from rectum, repeat lower endo us  in 1 year   TUBAL LIGATION     UPPER GASTROINTESTINAL ENDOSCOPY  05/10/2021   Patient Active Problem List   Diagnosis Date Noted   Positive  self-administered antigen test for COVID-19 01/18/2022   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   Abnormal uterine bleeding (AUB) 08/22/2021   Insomnia 12/13/2018   Hyperglycemia 12/13/2018   Gastroesophageal reflux disease without esophagitis 05/20/2018   Tobacco dependence 02/27/2016   Neuropathy 02/27/2016    PCP: Bascom Borer, NP REFERRING PROVIDER: Artist Lloyd, MD  REFERRING DIAG:  G43.809 (ICD-10-CM) - Other migraine without status migrainosus, not intractable  S06.0X0A (ICD-10-CM) - Concussion without loss of consciousness, initial encounter    THERAPY DIAG:  Unsteadiness on feet - Plan: PT plan of care cert/re-cert  Dizziness and giddiness - Plan: PT plan of care cert/re-cert  BPPV (benign paroxysmal positional vertigo), right - Plan: PT plan of care cert/re-cert  ONSET DATE: 09/23/23   Rationale for Evaluation and Treatment: Rehabilitation  SUBJECTIVE:   SUBJECTIVE STATEMENT: Has been trying to lay on her L side more. Has been getting a little dizzy. Notes going to her R side will still make her dizzy. Also if she still has to bend down and get something will get dizzy and stagger. Feels like things are getting better, no longer having as many headaches. Notes the nausea has slowed down. Still trying to get the dizziness under control. Still having blurred vision, does not have an appt with an eye doctor    Pt accompanied by: self  PERTINENT HISTORY: GERD, vit B12 deficiency, vit  D deficiency   PAIN:  Are you having pain? None  Vitals:   01/04/24 1500  BP: 131/72  Pulse: 78      PRECAUTIONS: Fall  WEIGHT BEARING RESTRICTIONS: No  FALLS: Has patient fallen in last 6 months? No  LIVING ENVIRONMENT: Lives with: lives with their family Lives in: House/apartment Stairs: Yes: Internal: flight steps; on right going up and External: 5-6 steps; on right going up Has following equipment at home: None  PLOF: Independent driving, working full time in  warehouse  PATIENT GOALS: to get back to my normal self  OBJECTIVE:  Note: Objective measures were completed at Evaluation unless otherwise noted.  DIAGNOSTIC FINDINGS: 11/05/23 CT Head  IMPRESSION: 1. No acute intracranial abnormality.  COGNITION: Overall cognitive status: Within functional limits for tasks assessed   SENSATION: WFL, but reports CTS in R UE  POSTURE:  rounded shoulders, forward head, increased thoracic kyphosis, posterior pelvic tilt, and flexed trunk   Cervical ROM:   WFL, painfree but slightly sore with R lateral rotation  STRENGTH: WFL   BED MOBILITY:  Intermittent dizziness if supine > sit too quickly, otherwise independent    GAIT: Gait pattern: WFL  PATIENT SURVEYS:  -Post Concussion Symptom Scale (PCSS)  -60/126 (15% normal) - Convergence Insufficiency Symptom Survey   -19/56  VESTIBULAR ASSESSMENT:  GENERAL OBSERVATION: NAD, no AD   SYMPTOM BEHAVIOR:  Subjective history: see above  Non-Vestibular symptoms: changes in vision, neck pain, headaches, tinnitus, nausea/vomiting, and migraine symptoms  Type of dizziness: Blurred Vision, Imbalance (Disequilibrium), Spinning/Vertigo, and weird  Frequency: multiple times/day   Duration: minutes  Aggravating factors: Spontaneous, Induced by position change: supine to sit, Induced by motion: bending down to the ground, turning body quickly, turning head quickly, and driving, Worse in the morning, Worse outside or in busy environment, Occurs when standing still , and Moving eyes  Relieving factors: medication, rest, slow movements, and avoid busy/distracting environments  Progression of symptoms: worse  OCULOMOTOR EXAM:  Ocular Alignment: normal  Ocular ROM: No Limitations  Spontaneous Nystagmus: absent  Gaze-Induced Nystagmus: absent  Smooth Pursuits: intact  Saccades: intact  Convergence/Divergence: to be assessed                                                                                                                             TREATMENT   Therapeutic Activity/NMR Vitals:   01/04/24 1500  BP: 131/72  Pulse: 78    Goal Assessment: Post Concussion Symptom Scale (PCSS) 28/126, pt subjectively reporting 70% normal Convergence Insufficiency Symptom Survey  27/56 (>21 = convergence insufficiency)  Discussed importance of following up with eye doctor as pt still with blurred vision after her concussion. Pt reports she has not been to one in a while, but plans on making an appt or asking her PCP/Dr. Joane for a referral for one. Discussed will continue to work on visual deficits with PT, but also seeing an eye doctor is recommended as blurred vision  can also contribute to balance/dizziness   POSITIONAL TESTING: Right Dix-Hallpike: upbeating, right nystagmus and very mild and low amplitude, lasting approx 45 seconds  Canalith Repositioning: Epley Right: Number of Reps: 2, Response to Treatment: comment: pt with mild sx and low amplitude nystagmus, very mild sx with 2nd rep, and Comment: No nystagmus or dizziness after re-assessing after 2 reps with pt in R DixHallpike position    Reviewed education regarding BPPV etiology and symptoms  Re-assessed with R sidelying and tried to perform Wilhelmena Carrel for that side for lingering mild sx, pt with no nystagmus or dizziness in position or with return to sitting, performed 2 reps total. Pt also tried fully rolling onto R side with no symptoms. Discussed if pt does have dizziness in the next day tomorrow before her next appt can try R sided Wilhelmena Carrel   Educated for pt to try to sleep on her L side for the next night after canalith repositioning   PATIENT EDUCATION: Education details: see above, BPPV education  Person educated: Patient Education method: Explanation, Demonstration, Verbal cues, and Handouts Education comprehension: verbalized understanding and needs further education  HOME EXERCISE PROGRAM: -Lucious  string 3x/day 3 sets of 30s  Seated horizontal VOR x1 30 seconds   GOALS: Goals reviewed with patient? Yes  SHORT TERM GOALS: =LTG based on PT POC length   LONG TERM GOALS: Target date: 01/01/24  Pt will be independent with final HEP for improved symptom report and convergence   Baseline: will benefit from updated/additions  Goal status: ON-GOING  2.  Convergence goal  Baseline: ~5 cm, goal not needed  Goal status: N/A  3.  DVA goal  Baseline: pt initially not able to tolerate due to incr dizziness  Goal status: INITIAL  4.  Patient will improve score to </= 50/126 on the PCSS to demonstrate reduction in symptoms  Baseline: 60/126  28/126 (12/22) Goal status: MET  5.  Patient will improve score to </= 16 on the CISS to demonstrate an improvement in her convergence symptoms Baseline: 19/56   27/56 (12/22) Goal status: NOT MET  6. MSQ goal   Baseline: to be complete  Goal status: INITIAL  UPDATED/ONGOING LTGS FOR RE-CERT:  LONG TERM GOALS: Target date: 02/01/2024  Pt will be independent with final HEP for improved symptom report and convergence   Baseline: will benefit from updated/additions  Goal status: ON-GOING  2.  DVA goal  Baseline: still need to assess, pt initially more symptomatic  Goal status: INITIAL   3.  Patient will improve score to </= 16 on the CISS to demonstrate an improvement in her convergence symptoms Baseline: 19/56   27/56 (12/22) Goal status:ON-GOING  4. MSQ goal   Baseline: to be complete  Goal status: INITIAL  5. Pt will demo negative R posterior canalithiasis in order to resolve dizziness with bending over and bed mobility   Baseline: pt cleared at end of session on 12/22. Will need to re-assess and make sure pt stays cleared  Goal status: INITIAL   ASSESSMENT:  CLINICAL IMPRESSION: Today's skilled session focused on re-assessing positional testing as pt still reporting dizziness with bending over and assessing LTGs. Pt  continued to demonstrate (+) R posterior canalithiasis with very mild low amplitude nystagmus. Pt reporting less symptoms than previous session. Performed the Epley maneuver 2 times with pt demonstrating resolution after 2 reps. Provided pt with R sided Wilhelmena Carrel exercises in case dizziness returns before next appt. Pt met LTG #4 in regards  to Post-Concussion Symptom Scale with pt with more mild/no symptoms compared to when first assessed. Pt did not meet LTG #5 in regards to Convergence Insufficiency Symptom Scale - pt scoring higher than when first assessed indicating continued convergence insufficiency. Have been working on [employer] with pt, but educated pt to make sure to make an appt with an eye doctor due to continued/worsening blurred vision after a concussion. Pt verbalized understanding. Attempted to perform DVA at a previous session, but pt unable to tolerate due to incr sx. Will further assess DVA and MSQ at future session and write goals after BPPV is cleared. Pt will continue to benefit from skilled PT to address concussion sx, clear BPPV and  address dizziness/visual deficits to help return to PLOF. LTGs updated/revised as appropriate.   OBJECTIVE IMPAIRMENTS: decreased activity tolerance, decreased knowledge of condition, dizziness, impaired vision/preception, and pain.   ACTIVITY LIMITATIONS: carrying, lifting, bending, standing, hygiene/grooming, locomotion level, and caring for others  PARTICIPATION LIMITATIONS: meal prep, cleaning, interpersonal relationship, driving, shopping, community activity, occupation, and yard work  PERSONAL FACTORS: Age, Fitness, Past/current experiences, Profession, Sex, Social background, and Time since onset of injury/illness/exacerbation are also affecting patient's functional outcome.   REHAB POTENTIAL: Good  CLINICAL DECISION MAKING: Stable/uncomplicated  EVALUATION COMPLEXITY: Low   PLAN:  PT FREQUENCY: 2x/week  PT DURATION: 4  weeks, plus an additional 2x week for 4 weeks for re-cert   PLANNED INTERVENTIONS: 97164- PT Re-evaluation, 97750- Physical Performance Testing, 97110-Therapeutic exercises, 97530- Therapeutic activity, 97112- Neuromuscular re-education, 97535- Self Care, 02859- Manual therapy, [phone]- Gait training, [phone]- Canalith repositioning, [phone]- Aquatic Therapy, Patient/Family education, Balance training, Stair training, Vestibular training, Visual/preceptual remediation/compensation, Cognitive remediation, and DME instructions  PLAN FOR NEXT SESSION:  Re-assess R posterior canal BPPV and treat as needed  look at [facility] and write goal.  work convergence exercises (sub symptom threshold) and monocular exercises (saccades, smooth pursuits, etc), progress VOR tasks with pt more symptomatic in horizontal direction    Sheffield Senate, PT, DPT 01/04/2024 3:37 PM    "

## 2024-01-06 ENCOUNTER — Ambulatory Visit: Payer: Worker's Compensation | Admitting: Physical Therapy

## 2024-01-06 ENCOUNTER — Encounter: Payer: Self-pay | Admitting: Physical Therapy

## 2024-01-06 ENCOUNTER — Ambulatory Visit: Payer: Worker's Compensation

## 2024-01-06 DIAGNOSIS — R2681 Unsteadiness on feet: Secondary | ICD-10-CM

## 2024-01-06 DIAGNOSIS — R42 Dizziness and giddiness: Secondary | ICD-10-CM

## 2024-01-06 DIAGNOSIS — H8111 Benign paroxysmal vertigo, right ear: Secondary | ICD-10-CM

## 2024-01-06 DIAGNOSIS — R41841 Cognitive communication deficit: Secondary | ICD-10-CM

## 2024-01-06 NOTE — Therapy (Signed)
 " OUTPATIENT PHYSICAL THERAPY VESTIBULAR TREATMENT    Patient Name: Vanessa Butler MRN: [id number] DOB:06-20-1975, 48 y.o., female Today's Date: 01/06/2024  END OF SESSION:  PT End of Session - 01/06/24 1149     Visit Number 5    Number of Visits 12    Date for Recertification  02/03/24   per re-cert on 87/77   Authorization Type Worker's Comp    Authorization - Visit Number 4    Authorization - Number of Visits 12    PT Start Time 1147    PT Stop Time 1227    PT Time Calculation (min) 40 min    Activity Tolerance Patient tolerated treatment well    Behavior During Therapy WFL for tasks assessed/performed          Past Medical History:  Diagnosis Date   Abnormal uterine bleeding (AUB)    GERD (gastroesophageal reflux disease)    Grieving    son murdered 60yrs ago per pt on 10-14-2021   Insomnia 11/2018   Skin disorder    seen at [facility] for keratoderma   Vitamin B12 deficiency 12/2018   Vitamin D  deficiency 12/2018   Past Surgical History:  Procedure Laterality Date   COLONOSCOPY  05/10/2021   DILATATION & CURETTAGE/HYSTEROSCOPY WITH MYOSURE N/A 09/21/2019   Procedure: DILATATION & CURETTAGE/HYSTEROSCOPY WITH MYOSURE;  Surgeon: Nicholaus Burnard HERO, MD;  Location: [facility];  Service: Gynecology;  Laterality: N/A;   DILITATION & CURRETTAGE/HYSTROSCOPY WITH HYDROTHERMAL ABLATION N/A 10/24/2021   Procedure: DILATATION & CURETTAGE/HYSTEROSCOPY WITH HYDROTHERMAL ABLATION;  Surgeon: Fredirick Glenys RAMAN, MD;  Location: [facility];  Service: Gynecology;  Laterality: N/A;   FOOT SURGERY Left 2008   HAND SURGERY Left 11/2020   to remove skin disorder keratoderma   [facility] endo [facility]   07/12/2021   at [facility], benign neuroendocrine tumor  6 mm x 4 mm removed from rectum, repeat lower endo us  in 1 year   TUBAL LIGATION     UPPER GASTROINTESTINAL ENDOSCOPY  05/10/2021   Patient Active Problem List   Diagnosis Date Noted   Positive self-administered  antigen test for COVID-19 01/18/2022   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   Abnormal uterine bleeding (AUB) 08/22/2021   Insomnia 12/13/2018   Hyperglycemia 12/13/2018   Gastroesophageal reflux disease without esophagitis 05/20/2018   Tobacco dependence 02/27/2016   Neuropathy 02/27/2016    PCP: Bascom Borer, NP REFERRING PROVIDER: Artist Lloyd, MD  REFERRING DIAG:  G43.809 (ICD-10-CM) - Other migraine without status migrainosus, not intractable  S06.0X0A (ICD-10-CM) - Concussion without loss of consciousness, initial encounter    THERAPY DIAG:  Unsteadiness on feet  Dizziness and giddiness  BPPV (benign paroxysmal positional vertigo), right  ONSET DATE: 09/23/23   Rationale for Evaluation and Treatment: Rehabilitation  SUBJECTIVE:   SUBJECTIVE STATEMENT: Just a tad of some of the spinning dizziness. Got a little dizzy when going down the stairs yesterday, but was more of an off balance. Didn't get dizzy when laying on her R side when trying the [employer] exercises.   Pt accompanied by: self  PERTINENT HISTORY: GERD, vit B12 deficiency, vit D deficiency   PAIN:  Are you having pain? None  There were no vitals filed for this visit.   PRECAUTIONS: Fall  WEIGHT BEARING RESTRICTIONS: No  FALLS: Has patient fallen in last 6 months? No  LIVING ENVIRONMENT: Lives with: lives with their family Lives in: House/apartment Stairs: Yes: Internal: flight steps; on right going up and External: 5-6 steps;  on right going up Has following equipment at home: None  PLOF: Independent driving, working full time in warehouse  PATIENT GOALS: to get back to my normal self  OBJECTIVE:  Note: Objective measures were completed at Evaluation unless otherwise noted.  DIAGNOSTIC FINDINGS: 11/05/23 CT Head  IMPRESSION: 1. No acute intracranial abnormality.  COGNITION: Overall cognitive status: Within functional limits for tasks assessed   SENSATION: WFL, but reports CTS in  R UE  POSTURE:  rounded shoulders, forward head, increased thoracic kyphosis, posterior pelvic tilt, and flexed trunk   Cervical ROM:   WFL, painfree but slightly sore with R lateral rotation  STRENGTH: WFL   BED MOBILITY:  Intermittent dizziness if supine > sit too quickly, otherwise independent    GAIT: Gait pattern: WFL  PATIENT SURVEYS:  -Post Concussion Symptom Scale (PCSS)  -60/126 (15% normal) - Convergence Insufficiency Symptom Survey   -19/56  VESTIBULAR ASSESSMENT:  GENERAL OBSERVATION: NAD, no AD   SYMPTOM BEHAVIOR:  Subjective history: see above  Non-Vestibular symptoms: changes in vision, neck pain, headaches, tinnitus, nausea/vomiting, and migraine symptoms  Type of dizziness: Blurred Vision, Imbalance (Disequilibrium), Spinning/Vertigo, and weird  Frequency: multiple times/day   Duration: minutes  Aggravating factors: Spontaneous, Induced by position change: supine to sit, Induced by motion: bending down to the ground, turning body quickly, turning head quickly, and driving, Worse in the morning, Worse outside or in busy environment, Occurs when standing still , and Moving eyes  Relieving factors: medication, rest, slow movements, and avoid busy/distracting environments  Progression of symptoms: worse  OCULOMOTOR EXAM:  Ocular Alignment: normal  Ocular ROM: No Limitations  Spontaneous Nystagmus: absent  Gaze-Induced Nystagmus: absent  Smooth Pursuits: intact  Saccades: intact  Convergence/Divergence: to be assessed                                                                                                                            TREATMENT   Therapeutic Activity  POSITIONAL TESTING: Right Dix-Hallpike: no nystagmus Left Dix-Hallpike: no nystagmus  Pt with no dizziness  MOTION SENSITIVITY:  Motion Sensitivity Quotient  Intensity: 0 = none, 1 = Lightheaded, 2 = Mild, 3 = Moderate, 4 = Severe, 5 = Vomiting  Intensity  1. Sitting to  supine 0  2. Supine to L side 0  3. Supine to R side 0  4. Supine to sitting 0  5. L Hallpike-Dix 0  6. Up from L  0  7. R Hallpike-Dix 0  8. Up from R  0  9. Sitting, head  tipped to L knee 0  10. Head up from L  knee 0  11. Sitting, head  tipped to R knee 0  12. Head up from R  knee 0  13. Sitting head turns x5 0  14.Sitting head nods x5 0  15. In stance, 180  turn to L  0  16. In stance, 180  turn to R 0   DVA: Static: Line 9  Dynamic: Line 4 5 line difference with pt reporting mild dizziness  Gait with head turns 30' and head nods 30', pt with mild unsteadiness with head turns>nods    M-CTSIB  Condition 1: Firm Surface, EO 30 Sec, Normal Sway  Condition 2: Firm Surface, EC 30 Sec, Normal Sway  Condition 3: Foam Surface, EO 30 Sec, Normal Sway  Condition 4: Foam Surface, EC 20 Sec, Moderate Sway    NMR:  Gaze Adaptation: x1 Viewing Horizontal: Position: Standing, Time: 30 seconds, Reps: 2, and Comment: mild dizziness when incr speed of head movement  x1 Viewing Vertical:  Position: Standing, Time: 30 seconds, Reps: 1, and Comment: no symptoms    In corner on air ex: Narrow BOS EC static stance 2 x 30 seconds Feet together EO 10 reps head turns, 10 reps head nods With feet hip width EC 2 x 10 reps head turns, 2 x 10 reps head nods   PATIENT EDUCATION: Education details: discussed BPPV is clear at this time, clinical findings regarding mCTSIB and DVA testing, additions to HEP for corner balance for vestibular input, 3 balance systems, progressing VOR to standing at home  Person educated: Patient Education method: Explanation, Demonstration, Verbal cues, and Handouts Education comprehension: verbalized understanding and needs further education  HOME EXERCISE PROGRAM: -Lucious string 3x/day 3 sets of 30s  Standing horizontal VOR x1 30 seconds   R Brandt Daroff   Access Code: [email or web address] URL: [email or web address] Date: 01/06/2024 Prepared by:  Sheffield Senate  Exercises - Romberg Stance Eyes Closed on Foam Pad  - 1-2 x daily - 5 x weekly - 3 sets - 30 hold - Standing Balance with Eyes Closed on Foam  - 1-2 x daily - 5 x weekly - 2 sets - 10 reps  GOALS: Goals reviewed with patient? Yes  SHORT TERM GOALS: =LTG based on PT POC length   LONG TERM GOALS: Target date: 01/01/24  Pt will be independent with final HEP for improved symptom report and convergence   Baseline: will benefit from updated/additions  Goal status: ON-GOING  2.  Convergence goal  Baseline: ~5 cm, goal not needed  Goal status: N/A  3.  DVA goal  Baseline: pt initially not able to tolerate due to incr dizziness  Goal status: INITIAL  4.  Patient will improve score to </= 50/126 on the PCSS to demonstrate reduction in symptoms  Baseline: 60/126  28/126 (12/22) Goal status: MET  5.  Patient will improve score to </= 16 on the CISS to demonstrate an improvement in her convergence symptoms Baseline: 19/56   27/56 (12/22) Goal status: NOT MET  6. MSQ goal   Baseline: to be complete  Goal status: INITIAL  UPDATED/ONGOING LTGS FOR RE-CERT:  LONG TERM GOALS: Target date: 02/01/2024  Pt will be independent with final HEP for improved symptom report and convergence   Baseline: will benefit from updated/additions  Goal status: ON-GOING  2.  Pt will improve DVA to a 3 line difference or less in order to demo improved VOR.  Baseline:5 line difference Goal status: INITIAL   3.  Patient will improve score to </= 16 on the CISS to demonstrate an improvement in her convergence symptoms Baseline: 19/56   27/56 (12/22) Goal status:ON-GOING  4. MSQ goal   Baseline: 0/5 on 12/24, goal not needed   Goal status: N/A  5. Pt will demo negative R posterior canalithiasis in order to resolve dizziness with bending over and bed mobility   Baseline: pt cleared at  end of session on 12/22. Will need to re-assess and make sure pt stays cleared  Goal status:  INITIAL   6. Pt will improve condition 4 of mCTSIB to at least 20 seconds in order to demo improved vestibular input for balance.   Baseline:2o seconds  Goal status: INITIAL   ASSESSMENT:  CLINICAL IMPRESSION: Re-assessed positional testing today with pt negative for R posterior canalithiasis and pt reporting no episodes of spinning dizziness since she was last here. Also assessed MSQ with pt scoring a 0/5 on all tasks and reporting no dizziness. Further assessed balance and VOR function during session. Pt did have a 5 line difference on the DVA with mild dizziness, indicating impaired VOR. Pt also only able to hold condition 4 of mCTSIB for 20 seconds and did have moderate postural sway indicating impaired vestibular input for balance. LTGs updated as appropriate. Remainder of session focused on adding corner balance to HEP and progressing VOR to standing. Will continue per POC.   OBJECTIVE IMPAIRMENTS: decreased activity tolerance, decreased knowledge of condition, dizziness, impaired vision/preception, and pain.   ACTIVITY LIMITATIONS: carrying, lifting, bending, standing, hygiene/grooming, locomotion level, and caring for others  PARTICIPATION LIMITATIONS: meal prep, cleaning, interpersonal relationship, driving, shopping, community activity, occupation, and yard work  PERSONAL FACTORS: Age, Fitness, Past/current experiences, Profession, Sex, Social background, and Time since onset of injury/illness/exacerbation are also affecting patient's functional outcome.   REHAB POTENTIAL: Good  CLINICAL DECISION MAKING: Stable/uncomplicated  EVALUATION COMPLEXITY: Low   PLAN:  PT FREQUENCY: 2x/week  PT DURATION: 4 weeks, plus an additional 2x week for 4 weeks for re-cert   PLANNED INTERVENTIONS: [id number]- PT Re-evaluation, 97750- Physical Performance Testing, 97110-Therapeutic exercises, 97530- Therapeutic activity, 97112- Neuromuscular re-education, 97535- Self Care, [id number]- Manual therapy,  [phone]- Gait training, [phone]- Canalith repositioning, [phone]- Aquatic Therapy, Patient/Family education, Balance training, Stair training, Vestibular training, Visual/preceptual remediation/compensation, Cognitive remediation, and DME instructions  PLAN FOR NEXT SESSION: recheck R posterior canal BPPV as needed Work on [facility] balance tasks, visual/vestibular exercises with tracking, progress VOR, head motions    Sheffield Senate, PT, DPT 01/06/2024 12:46 PM    "

## 2024-01-06 NOTE — Therapy (Signed)
 " OUTPATIENT SPEECH LANGUAGE PATHOLOGY EVALUATION   Patient Name: Vanessa Butler MRN: [id number] DOB:Aug 07, 1975, 48 y.o., female Today's Date: 01/06/2024  PCP: [name], NP REFERRING PROVIDER: Joane Artist RAMAN, MD  END OF SESSION:  End of Session - 01/06/24 1416     Visit Number 1    Number of Visits 13    Date for Recertification  02/17/24    SLP Start Time 1230    SLP Stop Time  1318    SLP Time Calculation (min) 48 min    Activity Tolerance Patient tolerated treatment well          Past Medical History:  Diagnosis Date   Abnormal uterine bleeding (AUB)    GERD (gastroesophageal reflux disease)    Grieving    son murdered 58yrs ago per pt on 10-14-2021   Insomnia 11/2018   Skin disorder    seen at [facility] for keratoderma   Vitamin B12 deficiency 12/2018   Vitamin D  deficiency 12/2018   Past Surgical History:  Procedure Laterality Date   COLONOSCOPY  05/10/2021   DILATATION & CURETTAGE/HYSTEROSCOPY WITH MYOSURE N/A 09/21/2019   Procedure: DILATATION & CURETTAGE/HYSTEROSCOPY WITH MYOSURE;  Surgeon: Nicholaus Burnard HERO, MD;  [facility];  Service: Gynecology;  Laterality: N/A;   DILITATION & CURRETTAGE/HYSTROSCOPY WITH HYDROTHERMAL ABLATION N/A 10/24/2021   Procedure: DILATATION & CURETTAGE/HYSTEROSCOPY WITH HYDROTHERMAL ABLATION;  Surgeon: Fredirick Glenys RAMAN, MD;  Location: [facility];  Service: Gynecology;  Laterality: N/A;   FOOT SURGERY Left 2008   HAND SURGERY Left 11/2020   to remove skin disorder keratoderma   lower endo [facility]   07/12/2021   at [facility], benign neuroendocrine tumor  6 mm x 4 mm removed from rectum, repeat lower endo us  in 1 year   TUBAL LIGATION     UPPER GASTROINTESTINAL ENDOSCOPY  05/10/2021   Patient Active Problem List   Diagnosis Date Noted   Positive self-administered antigen test for COVID-19 01/18/2022   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   Abnormal uterine bleeding (AUB) 08/22/2021    Insomnia 12/13/2018   Hyperglycemia 12/13/2018   Gastroesophageal reflux disease without esophagitis 05/20/2018   Tobacco dependence 02/27/2016   Neuropathy 02/27/2016    ONSET DATE: 12/15/2023 (Referral date)   REFERRING DIAG:  S06.0X0A (ICD-10-CM) - Concussion without loss of consciousness, initial encounter    THERAPY DIAG:  Cognitive communication deficit  Rationale for Evaluation and Treatment: Rehabilitation  SUBJECTIVE:   SUBJECTIVE STATEMENT: When I am told something Pt accompanied by: self  PERTINENT HISTORY: GERD, vit B12 deficiency, vit D deficiency. Pt notes that when she has a conversation, she forgets what she is talking about. She states that she forgets what people say around 5 minutes after they have told her something. 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 When items are missing, pt has to read orders to find which item needs to be found. Pt states that she feels okay about household responsibilities; however, pt as moments where she begins a task, but forgets what she is doing shortly later. Pt experiences communication breakdown with people, especially her husband. She notes that she will often have to ask for multiple repetitions. Pt feels confused during conversations. Pt also experiences difficulties with phone calls with her husband due to difficulties with thought organization.   PAIN:  Are you having pain? No  FALLS: Has patient fallen in last 6 months?  See PT evaluation for details  LIVING ENVIRONMENT: Lives with: lives with their family, lives with their spouse, and lives with their son Lives in: House/apartment  PLOF:  Level of assistance: Independent with ADLs Employment: Full-time employment  PATIENT GOALS: To get back to work and improve communication.   OBJECTIVE:  Note: Objective measures were completed at Evaluation  unless otherwise noted.  DIAGNOSTIC FINDINGS: EXAM: CT HEAD WITHOUT CONTRAST 11/05/2023 04:00:55 PM   TECHNIQUE: CT of the head was performed without the administration of intravenous contrast. Automated exposure control, iterative reconstruction, and/or weight based adjustment of the mA/kV was utilized to reduce the radiation dose to as low as reasonably achievable.   COMPARISON: None available.   CLINICAL HISTORY: Headache, increasing frequency or severity.   FINDINGS:   BRAIN AND VENTRICLES: No acute hemorrhage. No evidence of acute infarct. No hydrocephalus. No extra-axial collection. No mass effect or midline shift.   ORBITS: No acute abnormality.   SINUSES: No acute abnormality.   SOFT TISSUES AND SKULL: No acute soft tissue abnormality. No skull fracture.   IMPRESSION: 1. No acute intracranial abnormality.  COGNITION: Overall cognitive status: Impaired: Areas of impairment:  Memory: Impaired: Working It Consultant function: Impaired: Slow processing   Functional deficits: Following along in conversations, difficulties with remembering names and important information from conversations.  COGNITIVE COMMUNICATION: Following directions: Follows multi-step commands with increased time  Auditory comprehension: Impaired: Pt requires extra time and repetitions for complex auditory comprehension. Verbal expression: WFL Functional communication: WFL   STANDARDIZED ASSESSMENTS: SLUMS: 26/30  PATIENT REPORTED OUTCOME MEASURES (PROM): Mental Fatigue Scale: To be completed by upcoming session.                                                                                                                             TREATMENT DATE:   01/06/24: Evaluation completed. No tx completed this date.    PATIENT EDUCATION: Education details: Evaluation results.  Person educated: Patient Education method: Explanation Education comprehension: verbalized  understanding and needs further education   GOALS: Goals reviewed with patient? Yes  SHORT TERM GOALS: Target date: 01/25/24  Pt will describe and utilize at least 2 internal/external memory strategies to optimize safety and efficiency during structured, functional tasks related to work given occasional min A. Baseline: Goal status: INITIAL  2.  Pt and pt's family will successfully utilize repair strategies during moments of communication breakdown to maximize pt's communicative effectiveness in 80% of opportunities given occasional min A. Baseline:  Goal status: INITIAL  3.  Pt will demonstrate successful thought organization for communication as evident by <3 pauses during moderately complex conversation given occasional min A. Baseline:  Goal status: INITIAL  4.  Pt will develop and perform appropriate sequencing tasks related to work procedures (using external memory strategies) with 100% consistency given occasional min A. Baseline:  Goal status: INITIAL  5.   Pt will complete PROM for post-concussion symptoms.  Baseline:  Goal status: INITIAL  6.  TBD. Baseline:  Goal status: INITIAL  LONG TERM GOALS: Target date: 02/17/24  Pt will utilize memory strategies independently for optimizing communication effectiveness and returning to work. Baseline:  Goal status: INITIAL  2.  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  Baseline:  Goal status: INITIAL  3.  Pt and pt's family will utilize communication support strategies independently to maximize pt's complex auditory comprehension and thought organization during moderately complex conversation. Baseline:  Goal status: INITIAL  4.  TBD Baseline:  Goal status: INITIAL  5.  TBD Baseline:  Goal status: INITIAL  6.  TBD Baseline:  Goal status: INITIAL  ASSESSMENT:  CLINICAL IMPRESSION: Patient is a 48 y.o. female who was seen today for  cognitive evaluation s/p concussion. Pt presents with mild-moderate cognitive-communication deficits primarily characterized by short-term memory impairment and executive dysfunction due to slow processing. Pt exhibited many moments where she required multiple repetitions and extra time for processing to understand assessment tasks. According to pt's recent hx, she experiences difficulties with her cognition during conversations with family and performing daily tasks requiring short-term memory skills. ST is recommended to optimizing pt's cognitive-communication skills for returning to work and effectively communicating during ADLs. Without ST, pt is at risk of low job performance and lower QOL due to social isolation as a result of cognitive-communication deficits.  OBJECTIVE IMPAIRMENTS: include memory and executive functioning. These impairments are limiting patient from return to work, ADLs/IADLs, and effectively communicating at home and in community. Factors affecting potential to achieve goals and functional outcome are N/A. Patient will benefit from skilled SLP services to address above impairments and improve overall function.  REHAB POTENTIAL: Excellent  PLAN:  SLP FREQUENCY: 2x/week  SLP DURATION: 6 weeks  PLANNED INTERVENTIONS: Environmental controls, Cognitive reorganization, Internal/external aids, Functional tasks, SLP instruction and feedback, Compensatory strategies, Patient/family education, and [id number] Treatment of speech (30 or 45 min)     Waddell Music, CF-SLP 01/06/2024, 2:16 PM      "

## 2024-01-13 ENCOUNTER — Ambulatory Visit: Payer: Worker's Compensation | Admitting: Physical Therapy

## 2024-01-13 ENCOUNTER — Ambulatory Visit: Payer: Worker's Compensation | Admitting: Speech Pathology

## 2024-01-13 ENCOUNTER — Encounter: Payer: Self-pay | Admitting: Physical Therapy

## 2024-01-13 DIAGNOSIS — R42 Dizziness and giddiness: Secondary | ICD-10-CM

## 2024-01-13 DIAGNOSIS — R2681 Unsteadiness on feet: Secondary | ICD-10-CM | POA: Diagnosis not present

## 2024-01-13 DIAGNOSIS — H8111 Benign paroxysmal vertigo, right ear: Secondary | ICD-10-CM

## 2024-01-13 DIAGNOSIS — R41841 Cognitive communication deficit: Secondary | ICD-10-CM

## 2024-01-13 NOTE — Therapy (Signed)
 " OUTPATIENT SPEECH LANGUAGE PATHOLOGY TREATMENT   Patient Name: Vanessa Butler MRN: [id number] DOB:Feb 09, 1975, 48 y.o., female Today's Date: 01/13/2024  PCP: [name], NP REFERRING PROVIDER: Joane Artist RAMAN, MD  END OF SESSION:  End of Session - 01/13/24 1409     Visit Number 2    Number of Visits 13    Date for Recertification  02/17/24    SLP Start Time 1403    SLP Stop Time  1445    SLP Time Calculation (min) 42 min    Activity Tolerance Patient tolerated treatment well          Past Medical History:  Diagnosis Date   Abnormal uterine bleeding (AUB)    GERD (gastroesophageal reflux disease)    Grieving    son murdered 69yrs ago per pt on 10-14-2021   Insomnia 11/2018   Skin disorder    seen at [facility] for keratoderma   Vitamin B12 deficiency 12/2018   Vitamin D  deficiency 12/2018   Past Surgical History:  Procedure Laterality Date   COLONOSCOPY  05/10/2021   DILATATION & CURETTAGE/HYSTEROSCOPY WITH MYOSURE N/A 09/21/2019   Procedure: DILATATION & CURETTAGE/HYSTEROSCOPY WITH MYOSURE;  Surgeon: Nicholaus Burnard HERO, MD;  Location: [facility];  Service: Gynecology;  Laterality: N/A;   DILITATION & CURRETTAGE/HYSTROSCOPY WITH HYDROTHERMAL ABLATION N/A 10/24/2021   Procedure: DILATATION & CURETTAGE/HYSTEROSCOPY WITH HYDROTHERMAL ABLATION;  Surgeon: Fredirick Glenys RAMAN, MD;  Location: [facility];  Service: Gynecology;  Laterality: N/A;   FOOT SURGERY Left 2008   HAND SURGERY Left 11/2020   to remove skin disorder keratoderma   [facility] endo [facility]   07/12/2021   at [facility], benign neuroendocrine tumor  6 mm x 4 mm removed from rectum, repeat lower endo us  in 1 year   TUBAL LIGATION     UPPER GASTROINTESTINAL ENDOSCOPY  05/10/2021   Patient Active Problem List   Diagnosis Date Noted   Positive self-administered antigen test for COVID-19 01/18/2022   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   Abnormal uterine bleeding (AUB) 08/22/2021    Insomnia 12/13/2018   Hyperglycemia 12/13/2018   Gastroesophageal reflux disease without esophagitis 05/20/2018   Tobacco dependence 02/27/2016   Neuropathy 02/27/2016    ONSET DATE: 12/15/2023 (Referral date)   REFERRING DIAG:  S06.0X0A (ICD-10-CM) - Concussion without loss of consciousness, initial encounter    THERAPY DIAG:  Cognitive communication deficit  Rationale for Evaluation and Treatment: Rehabilitation  SUBJECTIVE:   SUBJECTIVE STATEMENT: When I am told something Pt accompanied by: self  PERTINENT HISTORY: GERD, vit B12 deficiency, vit D deficiency. Pt notes that when she has a conversation, she forgets what she is talking about. She states that she forgets what people say around 5 minutes after they have told her something. 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 When items are missing, pt has to read orders to find which item needs to be found. Pt states that she feels okay about household responsibilities; however, pt as moments where she begins a task, but forgets what she is doing shortly later. Pt experiences communication breakdown with people, especially her husband. She notes that she will often have to ask for multiple repetitions. Pt feels confused during conversations. Pt also experiences difficulties with phone calls with her husband due to difficulties with thought organization.   PAIN:  Are you having pain? No  FALLS: Has patient fallen in last 6 months?  See PT evaluation for details  LIVING ENVIRONMENT: Lives with: lives with their family, lives with their spouse, and lives with their son Lives in: House/apartment  PLOF:  Level of assistance: Independent with ADLs Employment: Full-time employment  PATIENT GOALS: To get back to work and improve communication.   OBJECTIVE:  Note: Objective measures were completed at Evaluation  unless otherwise noted.  DIAGNOSTIC FINDINGS: EXAM: CT HEAD WITHOUT CONTRAST 11/05/2023 04:00:55 PM   TECHNIQUE: CT of the head was performed without the administration of intravenous contrast. Automated exposure control, iterative reconstruction, and/or weight based adjustment of the mA/kV was utilized to reduce the radiation dose to as low as reasonably achievable.   COMPARISON: None available.   CLINICAL HISTORY: Headache, increasing frequency or severity.   FINDINGS:   BRAIN AND VENTRICLES: No acute hemorrhage. No evidence of acute infarct. No hydrocephalus. No extra-axial collection. No mass effect or midline shift.   ORBITS: No acute abnormality.   SINUSES: No acute abnormality.   SOFT TISSUES AND SKULL: No acute soft tissue abnormality. No skull fracture.   IMPRESSION: 1. No acute intracranial abnormality.  COGNITION: Overall cognitive status: Impaired: Areas of impairment:  Memory: Impaired: Working It Consultant function: Impaired: Slow processing   Functional deficits: Following along in conversations, difficulties with remembering names and important information from conversations.  COGNITIVE COMMUNICATION: Following directions: Follows multi-step commands with increased time  Auditory comprehension: Impaired: Pt requires extra time and repetitions for complex auditory comprehension. Verbal expression: WFL Functional communication: WFL   STANDARDIZED ASSESSMENTS: SLUMS: 26/30  PATIENT REPORTED OUTCOME MEASURES (PROM): Mental Fatigue Scale: 13.5 - She rated a 2 fairly serious problem requiring a full nights sleep after working to her limit, having difficulty concentrating and forming thoughts, being more tearful than usual, an sleeping less than usual. She rated a 1 or a slight problem with reduced cognition in morning and night and better in afternoon and evening, and forgetting things more often than she used to.                                                                                                                              TREATMENT DATE:   01/13/24: Targeted training compensatory strategies for processing in conversations - including limiting back ground noises and distractions, repeating back what you have heard, talking on the phone seated in a quiet place. See Patient Instructions. Targeted awareness and compensations for cognitive impairments for return to work including using a chair, having a co-worker buddy to alert her to any mistakes or balance concerns, taking breaks in quiet place with closed eyes and not planning long or harder tasks after work.   01/06/24: Evaluation completed. No tx completed this date.    PATIENT EDUCATION: Education details: Evaluation results.  Person educated: Patient Education method: Explanation Education comprehension: verbalized understanding and needs further education   GOALS: Goals reviewed with patient? Yes  SHORT TERM GOALS: Target date: 01/25/24  Pt will describe  and utilize at least 2 internal/external memory strategies to optimize safety and efficiency during structured, functional tasks related to work given occasional min A. Baseline: Goal status: ONGOING  2.  Pt and pt's family will successfully utilize repair strategies during moments of communication breakdown to maximize pt's communicative effectiveness in 80% of opportunities given occasional min A. Baseline:  Goal status: ONGOING  3.  Pt will demonstrate successful thought organization for communication as evident by <3 pauses during moderately complex conversation given occasional min A. Baseline:  Goal status: ONGOING  4.  Pt will develop and perform appropriate sequencing tasks related to work procedures (using external memory strategies) with 100% consistency given occasional min A. Baseline:  Goal status: ONGOING  5.   Pt will complete PROM for post-concussion symptoms.  Baseline:  Goal  status: MET  6.  TBD. Baseline:  Goal status: ONGOING  LONG TERM GOALS: Target date: 02/17/24  Pt will utilize memory strategies independently for optimizing communication effectiveness and returning to work. Baseline:  Goal status: ONGOING  2.  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  Baseline:  Goal status: ONGOING  3.  Pt and pt's family will utilize communication support strategies independently to maximize pt's complex auditory comprehension and thought organization during moderately complex conversation. Baseline:  Goal status: ONGOING  4.  Pt will demonstrate anticipatory awareness of potential problems she may encounter at work due to cognitive changes Baseline:  Goal status: INITIAL  5.  TBD Baseline:  Goal status: INITIAL  6.  TBD Baseline:  Goal status: INITIAL  ASSESSMENT:  CLINICAL IMPRESSION: Patient is a 48 y.o. female who was seen today for cognitive evaluation s/p concussion. Pt presents with mild-moderate cognitive-communication deficits primarily characterized by short-term memory impairment and executive dysfunction due to slow processing. Pt exhibited many moments where she required multiple repetitions and extra time for processing to understand assessment tasks. According to pt's recent hx, she experiences difficulties with her cognition during conversations with family and performing daily tasks requiring short-term memory skills. ST is recommended to optimizing pt's cognitive-communication skills for returning to work and effectively communicating during ADLs. Without ST, pt is at risk of low job performance and lower QOL due to social isolation as a result of cognitive-communication deficits.  OBJECTIVE IMPAIRMENTS: include memory and executive functioning. These impairments are limiting patient from return to work, ADLs/IADLs, and effectively communicating at home and in  community. Factors affecting potential to achieve goals and functional outcome are N/A. Patient will benefit from skilled SLP services to address above impairments and improve overall function.  REHAB POTENTIAL: Excellent  PLAN:  SLP FREQUENCY: 2x/week  SLP DURATION: 6 weeks  PLANNED INTERVENTIONS: Environmental controls, Cognitive reorganization, Internal/external aids, Functional tasks, SLP instruction and feedback, Compensatory strategies, Patient/family education, and [id number] Treatment of speech (30 or 45 min)     Leita Hoehn MS, CCC-SLP 01/13/2024, 2:55 PM      "

## 2024-01-13 NOTE — Patient Instructions (Addendum)
" ° °  Your injury is invisible - you look good, so people will forget that your brain is healing - you need to remind them  After a concussion, you have limited energy pennies to spend each day - you need to decide what priorities you want to spend the pennies  After return to work - don't plan on doing anything after work for the first 2 weeks and limit weekend plans  To help Vanessa Butler follow conversations:  Get her attention before you speak  Use eye contact and face the person you are speaking to  Focus on her and the conversation rather than talking to her while you are doing something else  Be in close proximity to the person you are speaking to  Turn down any noise in the environment such as the TV, walk away from loud appliances, air conditioners, fans, dish washers etc  On the phone, sit in a quiet place like at a table or in a quiet room and focus on the conversation  In large gatherings, sit or stay on the side not the center of the room  Try to sit with a wall behind you or in a corner so noise isn't coming at you from all directions when dining out or attending gatherings  Work Accommodations:   Youth Worker Take breaks in quiet place - close your eyes Listen to your body - if you get off balance, tired then sit down Have a buddy at work to help you notice if you are having balance  issues or are making mistakes Get important information in writing - if your boss gives you multiple directions to make sure you got it all     "

## 2024-01-13 NOTE — Therapy (Signed)
 " OUTPATIENT PHYSICAL THERAPY VESTIBULAR TREATMENT    Patient Name: Vanessa Butler MRN: [id number] DOB:1975/02/06, 48 y.o., female Today's Date: 01/13/2024  END OF SESSION:  PT End of Session - 01/13/24 1235     Visit Number 6    Number of Visits 12    Date for Recertification  02/03/24   per re-cert on 87/77   Authorization Type Worker's Comp    Authorization - Visit Number 5    Authorization - Number of Visits 12    PT Start Time 1233    PT Stop Time 1313    PT Time Calculation (min) 40 min    Equipment Utilized During Treatment Gait belt    Activity Tolerance Patient tolerated treatment well    Behavior During Therapy WFL for tasks assessed/performed          Past Medical History:  Diagnosis Date   Abnormal uterine bleeding (AUB)    GERD (gastroesophageal reflux disease)    Grieving    son murdered 6yrs ago per pt on 10-14-2021   Insomnia 11/2018   Skin disorder    seen at [facility] for keratoderma   Vitamin B12 deficiency 12/2018   Vitamin D  deficiency 12/2018   Past Surgical History:  Procedure Laterality Date   COLONOSCOPY  05/10/2021   DILATATION & CURETTAGE/HYSTEROSCOPY WITH MYOSURE N/A 09/21/2019   Procedure: DILATATION & CURETTAGE/HYSTEROSCOPY WITH MYOSURE;  Surgeon: Nicholaus Burnard HERO, MD;  [facility];  Service: Gynecology;  Laterality: N/A;   DILITATION & CURRETTAGE/HYSTROSCOPY WITH HYDROTHERMAL ABLATION N/A 10/24/2021   Procedure: DILATATION & CURETTAGE/HYSTEROSCOPY WITH HYDROTHERMAL ABLATION;  Surgeon: Fredirick Glenys RAMAN, MD;  Location: [facility];  Service: Gynecology;  Laterality: N/A;   FOOT SURGERY Left 2008   HAND SURGERY Left 11/2020   to remove skin disorder keratoderma   lower endo [facility]   07/12/2021   at [facility], benign neuroendocrine tumor  6 mm x 4 mm removed from rectum, repeat lower endo us  in 1 year   TUBAL LIGATION     UPPER GASTROINTESTINAL ENDOSCOPY  05/10/2021   Patient Active Problem List    Diagnosis Date Noted   Positive self-administered antigen test for COVID-19 01/18/2022   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   Abnormal uterine bleeding (AUB) 08/22/2021   Insomnia 12/13/2018   Hyperglycemia 12/13/2018   Gastroesophageal reflux disease without esophagitis 05/20/2018   Tobacco dependence 02/27/2016   Neuropathy 02/27/2016    PCP: Bascom Borer, NP REFERRING PROVIDER: Artist Lloyd, MD  REFERRING DIAG:  G43.809 (ICD-10-CM) - Other migraine without status migrainosus, not intractable  S06.0X0A (ICD-10-CM) - Concussion without loss of consciousness, initial encounter    THERAPY DIAG:  Unsteadiness on feet  Dizziness and giddiness  BPPV (benign paroxysmal positional vertigo), right  ONSET DATE: 09/23/23   Rationale for Evaluation and Treatment: Rehabilitation  SUBJECTIVE:   SUBJECTIVE STATEMENT: Has not had any dizziness, just some imbalance with some of the exercises. Tried the letter exercise and it went well.   Pt accompanied by: self  PERTINENT HISTORY: GERD, vit B12 deficiency, vit D deficiency   PAIN:  Are you having pain? None  There were no vitals filed for this visit.   PRECAUTIONS: Fall  WEIGHT BEARING RESTRICTIONS: No  FALLS: Has patient fallen in last 6 months? No  LIVING ENVIRONMENT: Lives with: lives with their family Lives in: House/apartment Stairs: Yes: Internal: flight steps; on right going up and External: 5-6 steps; on right going up Has following equipment at home: None  PLOF: Independent driving, working full time in warehouse  PATIENT GOALS: to get back to my normal self  OBJECTIVE:  Note: Objective measures were completed at Evaluation unless otherwise noted.  DIAGNOSTIC FINDINGS: 11/05/23 CT Head  IMPRESSION: 1. No acute intracranial abnormality.  COGNITION: Overall cognitive status: Within functional limits for tasks assessed   SENSATION: WFL, but reports CTS in R UE  POSTURE:  rounded shoulders, forward  head, increased thoracic kyphosis, posterior pelvic tilt, and flexed trunk   Cervical ROM:   WFL, painfree but slightly sore with R lateral rotation  STRENGTH: WFL   BED MOBILITY:  Intermittent dizziness if supine > sit too quickly, otherwise independent    GAIT: Gait pattern: WFL  PATIENT SURVEYS:  -Post Concussion Symptom Scale (PCSS)  -60/126 (15% normal) - Convergence Insufficiency Symptom Survey   -19/56  VESTIBULAR ASSESSMENT:  GENERAL OBSERVATION: NAD, no AD   SYMPTOM BEHAVIOR:  Subjective history: see above  Non-Vestibular symptoms: changes in vision, neck pain, headaches, tinnitus, nausea/vomiting, and migraine symptoms  Type of dizziness: Blurred Vision, Imbalance (Disequilibrium), Spinning/Vertigo, and weird  Frequency: multiple times/day   Duration: minutes  Aggravating factors: Spontaneous, Induced by position change: supine to sit, Induced by motion: bending down to the ground, turning body quickly, turning head quickly, and driving, Worse in the morning, Worse outside or in busy environment, Occurs when standing still , and Moving eyes  Relieving factors: medication, rest, slow movements, and avoid busy/distracting environments  Progression of symptoms: worse  OCULOMOTOR EXAM:  Ocular Alignment: normal  Ocular ROM: No Limitations  Spontaneous Nystagmus: absent  Gaze-Induced Nystagmus: absent  Smooth Pursuits: intact  Saccades: intact  Convergence/Divergence: to be assessed                                                                                                                            TREATMENT:    NMR:  Gaze Adaptation: x1 Viewing Horizontal: Position: Standing with busy background, Time: 30 seconds, Reps: 2, and Comment: felt like the background was moving a little bit  x1 Viewing Vertical:  Position: Standing with busy background, Time: 30 seconds, Reps: 2, and Comment: no symptoms   Holding [employer] Chart during gait 230' moving  it forwards/backwards to work on community education officer, naming letters first and then naming the color of each letter, pt intermittently losing her place 77' moving it side to side for VOR cancellation with naming color of each letter, more imbalance here needing CGA intermittently, but no dizziness   On air ex: Feet slightly apart: EO holding ball and making CW/CCW circles 2 x 10 reps each direction, intermittent taps to wall for balance, going to the R was more challenging, pt reporting some dizziness initially  3/4 tandem with EO with each leg posteriorly:  2 x 10 reps head turns (pt initially reporting a spinning dizziness that subsided). 2 x 10 reps head nods  Sit <> stands with EO 5 reps, then 3 x 5 reps  with EC (most weight posteriorly, but improved balance with incr reps) Feet together EC 2 x 30 seconds EO marching 10 reps, EC marching 10 reps   PATIENT EDUCATION: Education details: continue HEP, progressing VOR to a busy background  Person educated: Patient Education method: Explanation, Demonstration, and Verbal cues Education comprehension: verbalized understanding and needs further education  HOME EXERCISE PROGRAM: -Brock string 3x/day 3 sets of 30s  Standing horizontal VOR x1 30 seconds  with busy background   JONELLE Wilhelmena Carrel   Access Code: [email or web address] URL: [email or web address] Date: 01/06/2024 Prepared by: Sheffield Senate  Exercises - Romberg Stance Eyes Closed on Foam Pad  - 1-2 x daily - 5 x weekly - 3 sets - 30 hold - Standing Balance with Eyes Closed on Foam  - 1-2 x daily - 5 x weekly - 2 sets - 10 reps  GOALS: Goals reviewed with patient? Yes  SHORT TERM GOALS: =LTG based on PT POC length   LONG TERM GOALS: Target date: 01/01/24  Pt will be independent with final HEP for improved symptom report and convergence   Baseline: will benefit from updated/additions  Goal status: ON-GOING  2.  Convergence goal  Baseline: ~5 cm, goal not needed  Goal status:  N/A  3.  DVA goal  Baseline: pt initially not able to tolerate due to incr dizziness  Goal status: INITIAL  4.  Patient will improve score to </= 50/126 on the PCSS to demonstrate reduction in symptoms  Baseline: 60/126  28/126 (12/22) Goal status: MET  5.  Patient will improve score to </= 16 on the CISS to demonstrate an improvement in her convergence symptoms Baseline: 19/56   27/56 (12/22) Goal status: NOT MET  6. MSQ goal   Baseline: to be complete  Goal status: INITIAL  UPDATED/ONGOING LTGS FOR RE-CERT:  LONG TERM GOALS: Target date: 02/01/2024  Pt will be independent with final HEP for improved symptom report and convergence   Baseline: will benefit from updated/additions  Goal status: ON-GOING  2.  Pt will improve DVA to a 3 line difference or less in order to demo improved VOR.  Baseline:5 line difference Goal status: INITIAL   3.  Patient will improve score to </= 16 on the CISS to demonstrate an improvement in her convergence symptoms Baseline: 19/56   27/56 (12/22) Goal status:ON-GOING  4. MSQ goal   Baseline: 0/5 on 12/24, goal not needed   Goal status: N/A  5. Pt will demo negative R posterior canalithiasis in order to resolve dizziness with bending over and bed mobility   Baseline: pt cleared at end of session on 12/22. Will need to re-assess and make sure pt stays cleared  Goal status: INITIAL   6. Pt will improve condition 4 of mCTSIB to at least 20 seconds in order to demo improved vestibular input for balance.   Baseline:2o seconds  Goal status: INITIAL   ASSESSMENT:  CLINICAL IMPRESSION: Today's skilled session focused on progressing vestibular exercises with VOR, EC, and head movements. Pt reporting slight dizziness with some head motion tasks, but otherwise just feeling more off balanced during the session. Able to progress VOR x1 with busy background, with pt reporting no dizziness, just incr visual stimulation. Pt is tolerating well, will  continue per POC.   OBJECTIVE IMPAIRMENTS: decreased activity tolerance, decreased knowledge of condition, dizziness, impaired vision/preception, and pain.   ACTIVITY LIMITATIONS: carrying, lifting, bending, standing, hygiene/grooming, locomotion level, and caring for others  PARTICIPATION LIMITATIONS: meal prep, cleaning, interpersonal relationship,  driving, shopping, community activity, occupation, and yard work  PERSONAL FACTORS: Age, Fitness, Past/current experiences, Profession, Sex, Social background, and Time since onset of injury/illness/exacerbation are also affecting patient's functional outcome.   REHAB POTENTIAL: Good  CLINICAL DECISION MAKING: Stable/uncomplicated  EVALUATION COMPLEXITY: Low   PLAN:  PT FREQUENCY: 2x/week  PT DURATION: 4 weeks, plus an additional 2x week for 4 weeks for re-cert   PLANNED INTERVENTIONS: [id number]- PT Re-evaluation, 97750- Physical Performance Testing, 97110-Therapeutic exercises, 97530- Therapeutic activity, 97112- Neuromuscular re-education, 97535- Self Care, [id number]- Manual therapy, [phone]- Gait training, [phone]- Canalith repositioning, [phone]- Aquatic Therapy, Patient/Family education, Balance training, Stair training, Vestibular training, Visual/preceptual remediation/compensation, Cognitive remediation, and DME instructions  PLAN FOR NEXT SESSION: recheck R posterior canal BPPV as needed Work on [facility] balance tasks, visual/vestibular exercises with tracking, progress VOR, head motions    Sheffield Senate, PT, DPT 01/13/2024 1:17 PM    "

## 2024-01-15 ENCOUNTER — Encounter: Payer: Self-pay | Admitting: Physical Therapy

## 2024-01-15 ENCOUNTER — Ambulatory Visit: Payer: Worker's Compensation | Attending: Family Medicine | Admitting: Physical Therapy

## 2024-01-15 DIAGNOSIS — R2681 Unsteadiness on feet: Secondary | ICD-10-CM | POA: Diagnosis present

## 2024-01-15 DIAGNOSIS — R42 Dizziness and giddiness: Secondary | ICD-10-CM | POA: Diagnosis present

## 2024-01-15 DIAGNOSIS — R498 Other voice and resonance disorders: Secondary | ICD-10-CM | POA: Diagnosis present

## 2024-01-15 DIAGNOSIS — R41841 Cognitive communication deficit: Secondary | ICD-10-CM | POA: Insufficient documentation

## 2024-01-15 NOTE — Therapy (Signed)
 " OUTPATIENT PHYSICAL THERAPY VESTIBULAR TREATMENT    Patient Name: Vanessa Butler MRN: [id number] DOB:[birth year], 49 y.o., female Today's Date: 01/15/2024  END OF SESSION:  PT End of Session - 01/15/24 1235     Visit Number 7    Number of Visits 12    Date for Recertification  02/03/24   per re-cert on 87/77   Authorization Type Worker's Comp    Authorization - Visit Number 6    Authorization - Number of Visits 12    PT Start Time 1234    PT Stop Time 1312    PT Time Calculation (min) 38 min    Equipment Utilized During Treatment Gait belt    Activity Tolerance Patient tolerated treatment well    Behavior During Therapy WFL for tasks assessed/performed          Past Medical History:  Diagnosis Date   Abnormal uterine bleeding (AUB)    GERD (gastroesophageal reflux disease)    Grieving    son murdered 69yrs ago per pt on 10-14-2021   Insomnia 11/2018   Skin disorder    seen at [facility] for keratoderma   Vitamin B12 deficiency 12/2018   Vitamin D  deficiency 12/2018   Past Surgical History:  Procedure Laterality Date   COLONOSCOPY  05/10/2021   DILATATION & CURETTAGE/HYSTEROSCOPY WITH MYOSURE N/A 09/21/2019   Procedure: DILATATION & CURETTAGE/HYSTEROSCOPY WITH MYOSURE;  Surgeon: Nicholaus Burnard HERO, MD;  Location: [facility];  Service: Gynecology;  Laterality: N/A;   DILITATION & CURRETTAGE/HYSTROSCOPY WITH HYDROTHERMAL ABLATION N/A 10/24/2021   Procedure: DILATATION & CURETTAGE/HYSTEROSCOPY WITH HYDROTHERMAL ABLATION;  Surgeon: Fredirick Glenys RAMAN, MD;  Location: [facility] [place];  Service: Gynecology;  Laterality: N/A;   FOOT SURGERY Left 2008   HAND SURGERY Left 11/2020   to remove skin disorder keratoderma   lower endo [facility]   07/12/2021   at [facility], benign neuroendocrine tumor  6 mm x 4 mm removed from rectum, repeat lower endo us  in 1 year   TUBAL LIGATION     UPPER GASTROINTESTINAL ENDOSCOPY  05/10/2021   Patient Active Problem List    Diagnosis Date Noted   Positive self-administered antigen test for COVID-19 01/18/2022   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   Abnormal uterine bleeding (AUB) 08/22/2021   Insomnia 12/13/2018   Hyperglycemia 12/13/2018   Gastroesophageal reflux disease without esophagitis 05/20/2018   Tobacco dependence 02/27/2016   Neuropathy 02/27/2016    PCP: Bascom Borer, NP REFERRING PROVIDER: Artist Lloyd, MD  REFERRING DIAG:  G43.809 (ICD-10-CM) - Other migraine without status migrainosus, not intractable  S06.0X0A (ICD-10-CM) - Concussion without loss of consciousness, initial encounter    THERAPY DIAG:  Unsteadiness on feet  Dizziness and giddiness  ONSET DATE: 09/23/23   Rationale for Evaluation and Treatment: Rehabilitation  SUBJECTIVE:   SUBJECTIVE STATEMENT: Got a little dizzy one time when she was bending over. Wants to keep focusing on the balance.   Pt accompanied by: self  PERTINENT HISTORY: GERD, vit B12 deficiency, vit D deficiency   PAIN:  Are you having pain? None  There were no vitals filed for this visit.   PRECAUTIONS: Fall  WEIGHT BEARING RESTRICTIONS: No  FALLS: Has patient fallen in last 6 months? No  LIVING ENVIRONMENT: Lives with: lives with their family Lives in: House/apartment Stairs: Yes: Internal: flight steps; on right going up and External: 5-6 steps; on right going up Has following equipment at home: None  PLOF: Independent driving, working full time in warehouse  PATIENT  GOALS: to get back to my normal self  OBJECTIVE:  Note: Objective measures were completed at Evaluation unless otherwise noted.  DIAGNOSTIC FINDINGS: 11/05/23 CT Head  IMPRESSION: 1. No acute intracranial abnormality.  COGNITION: Overall cognitive status: Within functional limits for tasks assessed   SENSATION: WFL, but reports CTS in R UE  POSTURE:  rounded shoulders, forward head, increased thoracic kyphosis, posterior pelvic tilt, and flexed trunk    Cervical ROM:   WFL, painfree but slightly sore with R lateral rotation  STRENGTH: WFL   BED MOBILITY:  Intermittent dizziness if supine > sit too quickly, otherwise independent    GAIT: Gait pattern: WFL  PATIENT SURVEYS:  -Post Concussion Symptom Scale (PCSS)  -60/126 (15% normal) - Convergence Insufficiency Symptom Survey   -19/56  VESTIBULAR ASSESSMENT:  GENERAL OBSERVATION: NAD, no AD   SYMPTOM BEHAVIOR:  Subjective history: see above  Non-Vestibular symptoms: changes in vision, neck pain, headaches, tinnitus, nausea/vomiting, and migraine symptoms  Type of dizziness: Blurred Vision, Imbalance (Disequilibrium), Spinning/Vertigo, and weird  Frequency: multiple times/day   Duration: minutes  Aggravating factors: Spontaneous, Induced by position change: supine to sit, Induced by motion: bending down to the ground, turning body quickly, turning head quickly, and driving, Worse in the morning, Worse outside or in busy environment, Occurs when standing still , and Moving eyes  Relieving factors: medication, rest, slow movements, and avoid busy/distracting environments  Progression of symptoms: worse  OCULOMOTOR EXAM:  Ocular Alignment: normal  Ocular ROM: No Limitations  Spontaneous Nystagmus: absent  Gaze-Induced Nystagmus: absent  Smooth Pursuits: intact  Saccades: intact  Convergence/Divergence: to be assessed                                                                                                                            TREATMENT:    NMR:  Habituation to bending: Standing on air ex and bending down to grab cone and then come back to standing, performed with 5 cones and 4 sets, last set performed holding for a little longer when bending  Pt reporting a little dizziness, needing a seated rest break intermittently  On air ex: Wide BOS > feet hip width EC 2 x 10 reps head turns, 2 x 10 reps head nods  EO marching 10 reps, EC marching 2 x 10  reps  For gaze stabilization with gait: Holding ball and making CW/CCW circles 3 x 20' of each, CGA as needed for balance Holding ball and making diagonals in each direction 2 x 30' each  Pt reporting no dizziness, just unsteadiness   On blue foam beam: Tandem gait down and back x3 reps EC with slight space between feet 2 x 30 seconds Alternating forward stepping with head turns to R/L 10 reps each side     PATIENT EDUCATION: Education details: Got pt scheduled for a couple more appts, adding standing bending to HEP for habituation  Person educated: Patient Education method: Programmer, Multimedia, Demonstration, and Verbal cues Education  comprehension: verbalized understanding and needs further education  HOME EXERCISE PROGRAM: -Lucious string 3x/day 3 sets of 30s  Standing horizontal VOR x1 30 seconds  with busy background  -Standing habitation to bending   R Wilhelmena Carrel   Access Code: [email or web address] URL: [email or web address] Date: 01/06/2024 Prepared by: Sheffield Senate  Exercises - Romberg Stance Eyes Closed on Foam Pad  - 1-2 x daily - 5 x weekly - 3 sets - 30 hold - Standing Balance with Eyes Closed on Foam  - 1-2 x daily - 5 x weekly - 2 sets - 10 reps  GOALS: Goals reviewed with patient? Yes  SHORT TERM GOALS: =LTG based on PT POC length   LONG TERM GOALS: Target date: 01/01/24  Pt will be independent with final HEP for improved symptom report and convergence   Baseline: will benefit from updated/additions  Goal status: ON-GOING  2.  Convergence goal  Baseline: ~5 cm, goal not needed  Goal status: N/A  3.  DVA goal  Baseline: pt initially not able to tolerate due to incr dizziness  Goal status: INITIAL  4.  Patient will improve score to </= 50/126 on the PCSS to demonstrate reduction in symptoms  Baseline: 60/126  28/126 (12/22) Goal status: MET  5.  Patient will improve score to </= 16 on the CISS to demonstrate an improvement in her convergence  symptoms Baseline: 19/56   27/56 (12/22) Goal status: NOT MET  6. MSQ goal   Baseline: to be complete  Goal status: INITIAL  UPDATED/ONGOING LTGS FOR RE-CERT:  LONG TERM GOALS: Target date: 02/01/2024  Pt will be independent with final HEP for improved symptom report and convergence   Baseline: will benefit from updated/additions  Goal status: ON-GOING  2.  Pt will improve DVA to a 3 line difference or less in order to demo improved VOR.  Baseline:5 line difference Goal status: INITIAL   3.  Patient will improve score to </= 16 on the CISS to demonstrate an improvement in her convergence symptoms Baseline: 19/56   27/56 (12/22) Goal status:ON-GOING  4. MSQ goal   Baseline: 0/5 on 12/24, goal not needed   Goal status: N/A  5. Pt will demo negative R posterior canalithiasis in order to resolve dizziness with bending over and bed mobility   Baseline: pt cleared at end of session on 12/22. Will need to re-assess and make sure pt stays cleared  Goal status: INITIAL   6. Pt will improve condition 4 of mCTSIB to at least 30 seconds in order to demo improved vestibular input for balance.   Baseline:2o seconds  Goal status: INITIAL   ASSESSMENT:  CLINICAL IMPRESSION: Today's skilled session focused on progressing vestibular exercises with VOR, EC, bending, and head movements. Worked on habituation to bending in standing with pt reporting slight dizziness and needed intermittent rest breaks. Added to HEP to continue to work on. Pt reporting no dizziness through remainder of session, just felt more unsteady esp with VOR tasks during gait with tracking ball in circle and diagonal directions. Pt tolerated session well, will continue per POC.   OBJECTIVE IMPAIRMENTS: decreased activity tolerance, decreased knowledge of condition, dizziness, impaired vision/preception, and pain.   ACTIVITY LIMITATIONS: carrying, lifting, bending, standing, hygiene/grooming, locomotion level, and  caring for others  PARTICIPATION LIMITATIONS: meal prep, cleaning, interpersonal relationship, driving, shopping, community activity, occupation, and yard work  PERSONAL FACTORS: Age, Fitness, Past/current experiences, Profession, Sex, Social background, and Time since onset of injury/illness/exacerbation are also affecting patient's functional outcome.  REHAB POTENTIAL: Good  CLINICAL DECISION MAKING: Stable/uncomplicated  EVALUATION COMPLEXITY: Low   PLAN:  PT FREQUENCY: 2x/week  PT DURATION: 4 weeks, plus an additional 2x week for 4 weeks for re-cert   PLANNED INTERVENTIONS: [id number]- PT Re-evaluation, 97750- Physical Performance Testing, 97110-Therapeutic exercises, 97530- Therapeutic activity, [id number]- Neuromuscular re-education, 97535- Self Care, [id number]- Manual therapy, [phone]- Gait training, [phone]- Canalith repositioning, [phone]- Aquatic Therapy, Patient/Family education, Balance training, Stair training, Vestibular training, Visual/preceptual remediation/compensation, Cognitive remediation, and DME instructions  PLAN FOR NEXT SESSION: recheck R posterior canal BPPV as needed Work on [facility] balance tasks, visual/vestibular exercises with tracking, progress VOR, head motions, habituation to bending   Sheffield Senate, PT, DPT 01/15/2024 1:13 PM    "

## 2024-01-18 ENCOUNTER — Other Ambulatory Visit: Payer: Self-pay

## 2024-01-18 ENCOUNTER — Ambulatory Visit: Payer: MEDICAID | Admitting: Family Medicine

## 2024-01-18 VITALS — BP 118/78 | HR 77 | Ht 60.0 in | Wt 140.0 lb

## 2024-01-18 DIAGNOSIS — M25531 Pain in right wrist: Secondary | ICD-10-CM

## 2024-01-18 DIAGNOSIS — S060X0D Concussion without loss of consciousness, subsequent encounter: Secondary | ICD-10-CM

## 2024-01-18 DIAGNOSIS — G5601 Carpal tunnel syndrome, right upper limb: Secondary | ICD-10-CM | POA: Diagnosis not present

## 2024-01-18 NOTE — Progress Notes (Signed)
 Subjective:   I, Ileana Collet, PhD, LAT, ATC acting as a scribe for Artist Lloyd, MD.  Chief Complaint: Vanessa Butler,  is a 49 y.o. female who presents for 45-month f/u concussion resulting from an injury at work. Pt was last seen by Dr. Lloyd on 12/15/23 and was switched to Topamax . Pt also advised to cont vestibular therapy, completing 7 visits.  Today, pt reports Topamax  seems to be helping w/ her HA. She has been working on her balance. Speech therapy has also started.   Injury date: 09/23/23 Visit #: 3  History of Present Illness:   Concussion Self-Reported Symptom Score Symptoms rated on a scale 1-6, in last 24 hours  Headache: 1   Pressure in head: 1 Neck pain: 0 Nausea or vomiting: 0 Dizziness: 1  Blurred vision: 4  Balance problems: 3 Sensitivity to light:  2 Sensitivity to noise: 1 Feeling slowed down: 1 Feeling like in a fog: 0 Don't feel right: 1 Difficulty concentrating: 1 Difficulty remembering: 4 Fatigue or low energy: 1 Confusion: 1 Drowsiness: 1 More emotional: 3 Irritability: 0 Sadness: 1 Nervous or anxious: 0 Trouble falling asleep: 5   Total # of Symptoms: 17/22 Total Symptom Score: 32/132  Previous Total # of Symptoms: 21/22 Previous Symptom Score: 76/132  Tinnitus: Yes- bilat  Review of Systems: No fevers or chills    Review of History: Insomnia and neuropathy.  Carpal tunnel syndrome right  Objective:    Physical Examination Vitals:   01/18/24 1333  BP: 118/78  Pulse: 77  SpO2: 99%   MSK: Normal cervical motion.  Positive Tinel's right carpal tunnel. Neuro: Alert and oriented normal coordination Psych: Normal speech thought process and affect.     Imaging:  Carpal tunnel median nerve hydrodissection Procedure: Real-time Ultrasound Guided median nerve hydrodissection and carpal tunnel injection right Device: Philips Affiniti 50G/GE Logiq Images permanently stored and available for review in PACS Verbal informed  consent obtained.  Discussed risks and benefits of procedure. Warned about infection, bleeding, hyperglycemia damage to structures among others. Patient expresses understanding and agreement Time-out conducted.   Noted no overlying erythema, induration, or other signs of local infection.   Skin prepped in a sterile fashion.   Local anesthesia: Topical Ethyl chloride.   With sterile technique and under real time ultrasound guidance: 40 mg of Kenalog  and 1 mL of lidocaine  injected into carpal tunnel around the median nerve. Fluid seen entering the carpal tunnel.   Completed without difficulty   Pain immediately resolved suggesting accurate placement of the medication.   Advised to call if fevers/chills, erythema, induration, drainage, or persistent bleeding.   Images permanently stored and available for review in the ultrasound unit.  Impression: Technically successful ultrasound guided injection.    Assessment and Plan   49 y.o. female with concussion improving.  Anticipate return to work as scheduled on January 14.  Continue speech therapy and physical therapy and recheck back in clinic after speech therapy ends on February 18.  Additionally patient does have bothersome right carpal tunnel syndrome and does have a positive nerve conduction study from April 2025.  Plan for injection today and continued carpal tunnel wrist brace.       Action/Discussion: Reviewed diagnosis, management options, expected outcomes, and the reasons for scheduled and emergent follow-up. Questions were adequately answered. Patient expressed verbal understanding and agreement with the following plan.     Patient Education: Reviewed with patient the risks (i.e, a repeat concussion, post-concussion syndrome, second-impact syndrome) of returning to  play prior to complete resolution, and thoroughly reviewed the signs and symptoms of concussion.Reviewed need for complete resolution of all symptoms, with rest AND  exertion, prior to return to play. Reviewed red flags for urgent medical evaluation: worsening symptoms, nausea/vomiting, intractable headache, musculoskeletal changes, focal neurological deficits. [facility] Concussion Care Plan, which clearly outlines the plans stated above, was given to patient.   Level of service: Total encounter time 30 minutes including face-to-face time with the patient and, reviewing past medical record, and charting on the date of service.        After Visit Summary printed out and provided to patient as appropriate.  The above documentation has been reviewed and is accurate and complete Artist Lloyd

## 2024-01-18 NOTE — Patient Instructions (Addendum)
 Thank you for coming in today.   You received an injection today. Seek immediate medical attention if the joint becomes red, extremely painful, or is oozing fluid.   Carpal tunnel wrist brace  Continue current treatment.   Return to work on the 14th unless something changes.  We cam modify the return to work plan as needed.   Recheck with me after Speech Therapy ends on the 18th of February.   If we need to see you sooner we can.   Try using a carpal tunnel wrist brace at bedtime.   Call or go to the ER if you develop a large red swollen joint with extreme pain or oozing puss.

## 2024-01-19 DIAGNOSIS — G5600 Carpal tunnel syndrome, unspecified upper limb: Secondary | ICD-10-CM | POA: Insufficient documentation

## 2024-01-20 ENCOUNTER — Ambulatory Visit: Payer: Worker's Compensation | Attending: Nurse Practitioner | Admitting: Physical Therapy

## 2024-01-20 DIAGNOSIS — R2681 Unsteadiness on feet: Secondary | ICD-10-CM | POA: Diagnosis present

## 2024-01-20 DIAGNOSIS — R42 Dizziness and giddiness: Secondary | ICD-10-CM | POA: Insufficient documentation

## 2024-01-20 NOTE — Therapy (Signed)
 " OUTPATIENT PHYSICAL THERAPY VESTIBULAR TREATMENT    Patient Name: Vanessa Butler MRN: [id number] DOB:1975/08/24, 49 y.o., female Today's Date: 01/21/2024  END OF SESSION:  PT End of Session - 01/21/24 1754     Visit Number 8    Number of Visits 12    Date for Recertification  02/03/24   per re-cert on 87/77   Authorization Type Worker's Comp    Authorization - Visit Number 8    Authorization - Number of Visits 12    PT Start Time 1446    PT Stop Time 1530    PT Time Calculation (min) 44 min    Equipment Utilized During Treatment Gait belt    Activity Tolerance Patient tolerated treatment well    Behavior During Therapy WFL for tasks assessed/performed           Past Medical History:  Diagnosis Date   Abnormal uterine bleeding (AUB)    GERD (gastroesophageal reflux disease)    Grieving    son murdered 47yrs ago per pt on 10-14-2021   Insomnia 11/2018   Skin disorder    seen at [facility] for keratoderma   Vitamin B12 deficiency 12/2018   Vitamin D  deficiency 12/2018   Past Surgical History:  Procedure Laterality Date   COLONOSCOPY  05/10/2021   DILATATION & CURETTAGE/HYSTEROSCOPY WITH MYOSURE N/A 09/21/2019   Procedure: DILATATION & CURETTAGE/HYSTEROSCOPY WITH MYOSURE;  Surgeon: Nicholaus Burnard HERO, MD;  Location: [facility];  Service: Gynecology;  Laterality: N/A;   DILITATION & CURRETTAGE/HYSTROSCOPY WITH HYDROTHERMAL ABLATION N/A 10/24/2021   Procedure: DILATATION & CURETTAGE/HYSTEROSCOPY WITH HYDROTHERMAL ABLATION;  Surgeon: Fredirick Glenys RAMAN, MD;  Location: [facility] [place];  Service: Gynecology;  Laterality: N/A;   FOOT SURGERY Left 2008   HAND SURGERY Left 11/2020   to remove skin disorder keratoderma   lower endo [facility]   07/12/2021   at [facility], benign neuroendocrine tumor  6 mm x 4 mm removed from rectum, repeat lower endo us  in 1 year   TUBAL LIGATION     UPPER GASTROINTESTINAL ENDOSCOPY  05/10/2021   Patient Active Problem List    Diagnosis Date Noted   Carpal tunnel syndrome 01/19/2024   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   Abnormal uterine bleeding (AUB) 08/22/2021   Insomnia 12/13/2018   Hyperglycemia 12/13/2018   Gastroesophageal reflux disease without esophagitis 05/20/2018   Tobacco dependence 02/27/2016   Neuropathy 02/27/2016    PCP: Bascom Borer, NP REFERRING PROVIDER: Artist Lloyd, MD  REFERRING DIAG:  G43.809 (ICD-10-CM) - Other migraine without status migrainosus, not intractable  S06.0X0A (ICD-10-CM) - Concussion without loss of consciousness, initial encounter    THERAPY DIAG:  Unsteadiness on feet  Dizziness and giddiness  ONSET DATE: 09/23/23   Rationale for Evaluation and Treatment: Rehabilitation  SUBJECTIVE:   SUBJECTIVE STATEMENT: Pt reports she is doing the exercise of bending over and picking something up off floor but still gets dizzy, especially if she stays down there for a while; planning on returning to work next Wed., the 14th  Pt accompanied by: self  PERTINENT HISTORY: GERD, vit B12 deficiency, vit D deficiency   PAIN:  Are you having pain? None  There were no vitals filed for this visit.   PRECAUTIONS: Fall  WEIGHT BEARING RESTRICTIONS: No  FALLS: Has patient fallen in last 6 months? No  LIVING ENVIRONMENT: Lives with: lives with their family Lives in: House/apartment Stairs: Yes: Internal: flight steps; on right going up and External: 5-6 steps; on right going up  Has following equipment at home: None  PLOF: Independent driving, working full time in warehouse  PATIENT GOALS: to get back to my normal self  OBJECTIVE:  Note: Objective measures were completed at Evaluation unless otherwise noted.  DIAGNOSTIC FINDINGS: 11/05/23 CT Head  IMPRESSION: 1. No acute intracranial abnormality.  COGNITION: Overall cognitive status: Within functional limits for tasks assessed   SENSATION: WFL, but reports CTS in R UE  POSTURE:  rounded shoulders,  forward head, increased thoracic kyphosis, posterior pelvic tilt, and flexed trunk   Cervical ROM:   WFL, painfree but slightly sore with R lateral rotation  STRENGTH: WFL   BED MOBILITY:  Intermittent dizziness if supine > sit too quickly, otherwise independent    GAIT: Gait pattern: WFL  PATIENT SURVEYS:  -Post Concussion Symptom Scale (PCSS)  -60/126 (15% normal) - Convergence Insufficiency Symptom Survey   -19/56  VESTIBULAR ASSESSMENT:  GENERAL OBSERVATION: NAD, no AD   SYMPTOM BEHAVIOR:  Subjective history: see above  Non-Vestibular symptoms: changes in vision, neck pain, headaches, tinnitus, nausea/vomiting, and migraine symptoms  Type of dizziness: Blurred Vision, Imbalance (Disequilibrium), Spinning/Vertigo, and weird  Frequency: multiple times/day   Duration: minutes  Aggravating factors: Spontaneous, Induced by position change: supine to sit, Induced by motion: bending down to the ground, turning body quickly, turning head quickly, and driving, Worse in the morning, Worse outside or in busy environment, Occurs when standing still , and Moving eyes  Relieving factors: medication, rest, slow movements, and avoid busy/distracting environments  Progression of symptoms: worse  OCULOMOTOR EXAM:  Ocular Alignment: normal  Ocular ROM: No Limitations  Spontaneous Nystagmus: absent  Gaze-Induced Nystagmus: absent  Smooth Pursuits: intact  Saccades: intact  Convergence/Divergence: to be assessed                                                                                                                            TREATMENT:  TherAct:  Positional testing:  Rt Dix-Hallpike test (-) with no nystagmus and no c/o vertigo in test position;  pt had no c/o dizziness with return to upright sitting  Balance/vestibular exercises: Pt performed rockerboard - with EO with UE support 10 reps; 10 reps without UE support with CGA to min assist;  EC - 10 reps with bil. UE  support; 1 UE support with EC with CGA;   Pt held board steady - no UE support - performed horizontal head turns 5 reps with targets on either side; vertical head turns with targets on ceiling/floor 5 reps   Pt performed stepping forward off board to floor with head turn to contralateral side - 5 reps each side - cues for large step off board;  no UE support used  Pt performed standing on inverted Bosu - squats 10 reps with bil. UE support; weight shifts laterally 5 reps; weight shifts anterior/posteriorly 5 reps  Performed walking 30' tracking ball in different patterns - CW, CCW, X and T - 1 rep each  Standing on blue foam beam perpendicular - pt performed stepping sideways 2 laps (4 reps on beam) tossing and catching ball while sidestepping Tandem stance on blue beam - 2 laps on beam with CGA  Standing on mini-trampoline - marching in place with EO with gradual decrease in UE support; stepping with EC 10 reps with UE support initially and then 10 reps without UE support - tossing and catching green medicine ball while stepping in place; progressed to turning 180 degrees 1 rep to Rt side with tossing/catching ball; turning 180 degrees to Lt side with tossing/catching ball - pt performed 360 degree turn to Lt side with EO; turned 360 degree to Rt side with EO  Habituation activity - black & white checkered cloth placed on floor - 10 Squigz placed on 1 end - pt amb. And picked up individual squigz and turned 180 degrees to place in mile crate on other end of cloth; pt reported min to mod dizziness after this activity, with seated rest period needed Then carried crate to other end of cloth - bent down to place single Squigz onto cloth - placed all 10 Squigz on cloth in forward bent position without returning to upright standing until all 10 Squigz were transferred from crate to cloth; pt reported severe dizziness/light-headedness upon completion of this activity and had LOB with turning 180  degrees to walk to mat to sit down     PATIENT EDUCATION: Education details: Got pt scheduled for a couple more appts, adding standing bending to HEP for habituation  Person educated: Patient Education method: Programmer, Multimedia, Demonstration, and Verbal cues Education comprehension: verbalized understanding and needs further education  HOME EXERCISE PROGRAM: -Vanessa Butler 3x/day 3 sets of 30s  Standing horizontal VOR x1 30 seconds  with busy background  -Standing habitation to bending   Vanessa Butler   Access Code: [email or web address] URL: [email or web address] Date: 01/06/2024 Prepared by: Vanessa Butler  Exercises - Romberg Stance Eyes Closed on Foam Pad  - 1-2 x daily - 5 x weekly - 3 sets - 30 hold - Standing Balance with Eyes Closed on Foam  - 1-2 x daily - 5 x weekly - 2 sets - 10 reps  GOALS: Goals reviewed with patient? Yes  SHORT TERM GOALS: =LTG based on PT POC length   LONG TERM GOALS: Target date: 01/01/24  Pt will be independent with final HEP for improved symptom report and convergence   Baseline: will benefit from updated/additions  Goal status: ON-GOING  2.  Convergence goal  Baseline: ~5 cm, goal not needed  Goal status: N/A  3.  DVA goal  Baseline: pt initially not able to tolerate due to incr dizziness  Goal status: INITIAL  4.  Patient will improve score to </= 50/126 on the PCSS to demonstrate reduction in symptoms  Baseline: 60/126  28/126 (12/22) Goal status: MET  5.  Patient will improve score to </= 16 on the CISS to demonstrate an improvement in her convergence symptoms Baseline: 19/56   27/56 (12/22) Goal status: NOT MET  6. MSQ goal   Baseline: to be complete  Goal status: INITIAL  UPDATED/ONGOING LTGS FOR RE-CERT:  LONG TERM GOALS: Target date: 02/01/2024  Pt will be independent with final HEP for improved symptom report and convergence   Baseline: will benefit from updated/additions  Goal status: ON-GOING  2.  Pt will  improve DVA to a 3 line difference or less in order to demo improved VOR.  Baseline:5 line difference Goal status: INITIAL  3.  Patient will improve score to </= 16 on the CISS to demonstrate an improvement in her convergence symptoms Baseline: 19/56   27/56 (12/22) Goal status:ON-GOING  4. MSQ goal   Baseline: 0/5 on 12/24, goal not needed   Goal status: N/A  5. Pt will demo negative R posterior canalithiasis in order to resolve dizziness with bending over and bed mobility   Baseline: pt cleared at end of session on 12/22. Will need to re-assess and make sure pt stays cleared  Goal status: INITIAL   6. Pt will improve condition 4 of mCTSIB to at least 30 seconds in order to demo improved vestibular input for balance.   Baseline:2o seconds  Goal status: INITIAL   ASSESSMENT:  CLINICAL IMPRESSION: PT session focused on habituation and balance activities with increased vestibular input incorporated.  Pt reported some fear of falling/nervousness with standing on rockerboard without UE support and on inverted Bosu with moderate postural instability noted with rockerboard activity.  Pt had minimal unsteadiness with stepping and turning on trampoline but able to recover with CGA.  Pt reported most dizziness was provoked with bending over and remaining in this position to place 10 Squigz individually from crate to patterned cloth.  Pt reported moderate to severe dizziness with return to upright standing.  Pt tolerated exercises well overall - needed frequent seated rest periods but stated she wanted to push herself, as she wants to get back to her normal self.  Cont with POC.   OBJECTIVE IMPAIRMENTS: decreased activity tolerance, decreased knowledge of condition, dizziness, impaired vision/preception, and pain.   ACTIVITY LIMITATIONS: carrying, lifting, bending, standing, hygiene/grooming, locomotion level, and caring for others  PARTICIPATION LIMITATIONS: meal prep, cleaning,  interpersonal relationship, driving, shopping, community activity, occupation, and yard work  PERSONAL FACTORS: Age, Fitness, Past/current experiences, Profession, Sex, Social background, and Time since onset of injury/illness/exacerbation are also affecting patient's functional outcome.   REHAB POTENTIAL: Good  CLINICAL DECISION MAKING: Stable/uncomplicated  EVALUATION COMPLEXITY: Low   PLAN:  PT FREQUENCY: 2x/week  PT DURATION: 4 weeks, plus an additional 2x week for 4 weeks for re-cert   PLANNED INTERVENTIONS: [id number]- PT Re-evaluation, 97750- Physical Performance Testing, 97110-Therapeutic exercises, 97530- Therapeutic activity, 97112- Neuromuscular re-education, 97535- Self Care, [id number]- Manual therapy, [phone]- Gait training, [phone]- Canalith repositioning, [phone]- Aquatic Therapy, Patient/Family education, Balance training, Stair training, Vestibular training, Visual/preceptual remediation/compensation, Cognitive remediation, and DME instructions  PLAN FOR NEXT SESSION: Cont balance/vestibular exercises  - trampoline, rockerboard, Bosu; D/C or continue for remaining authorized sessions? Work on [employer] tasks, visual/vestibular exercises with tracking, progress VOR, head motions, habituation to bending   Elvie Kussmaul, PT 01/21/2024 5:56 PM    "

## 2024-01-21 ENCOUNTER — Encounter: Payer: Self-pay | Admitting: Physical Therapy

## 2024-01-25 ENCOUNTER — Ambulatory Visit: Payer: Worker's Compensation

## 2024-01-25 DIAGNOSIS — R41841 Cognitive communication deficit: Secondary | ICD-10-CM

## 2024-01-25 DIAGNOSIS — R2681 Unsteadiness on feet: Secondary | ICD-10-CM | POA: Diagnosis not present

## 2024-01-25 NOTE — Therapy (Signed)
 " OUTPATIENT SPEECH LANGUAGE PATHOLOGY TREATMENT   Patient Name: Vanessa Butler MRN: [id number] DOB:06/20/1975, 49 y.o., female Today's Date: 01/25/2024  PCP: [name], NP REFERRING PROVIDER: Joane Artist RAMAN, MD  END OF SESSION:  End of Session - 01/25/24 1622     Visit Number 3    Number of Visits 13    Date for Recertification  02/17/24    SLP Start Time 1535    SLP Stop Time  1621    SLP Time Calculation (min) 46 min    Activity Tolerance Patient tolerated treatment well           Past Medical History:  Diagnosis Date   Abnormal uterine bleeding (AUB)    GERD (gastroesophageal reflux disease)    Grieving    son murdered 66yrs ago per pt on 10-14-2021   Insomnia 11/2018   Skin disorder    seen at [facility] for keratoderma   Vitamin B12 deficiency 12/2018   Vitamin D  deficiency 12/2018   Past Surgical History:  Procedure Laterality Date   COLONOSCOPY  05/10/2021   DILATATION & CURETTAGE/HYSTEROSCOPY WITH MYOSURE N/A 09/21/2019   Procedure: DILATATION & CURETTAGE/HYSTEROSCOPY WITH MYOSURE;  Surgeon: Nicholaus Burnard HERO, MD;  [facility];  Service: Gynecology;  Laterality: N/A;   DILITATION & CURRETTAGE/HYSTROSCOPY WITH HYDROTHERMAL ABLATION N/A 10/24/2021   Procedure: DILATATION & CURETTAGE/HYSTEROSCOPY WITH HYDROTHERMAL ABLATION;  Surgeon: Fredirick Glenys RAMAN, MD;  Location: [facility] [place];  Service: Gynecology;  Laterality: N/A;   FOOT SURGERY Left 2008   HAND SURGERY Left 11/2020   to remove skin disorder keratoderma   lower endo [facility]   07/12/2021   at [facility], benign neuroendocrine tumor  6 mm x 4 mm removed from rectum, repeat lower endo us  in 1 year   TUBAL LIGATION     UPPER GASTROINTESTINAL ENDOSCOPY  05/10/2021   Patient Active Problem List   Diagnosis Date Noted   Carpal tunnel syndrome 01/19/2024   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   Abnormal uterine bleeding (AUB) 08/22/2021   Insomnia 12/13/2018   Hyperglycemia  12/13/2018   Gastroesophageal reflux disease without esophagitis 05/20/2018   Tobacco dependence 02/27/2016   Neuropathy 02/27/2016    ONSET DATE: 12/15/2023 (Referral date)   REFERRING DIAG:  S06.0X0A (ICD-10-CM) - Concussion without loss of consciousness, initial encounter    THERAPY DIAG:  Cognitive communication deficit  Rationale for Evaluation and Treatment: Rehabilitation  SUBJECTIVE:   SUBJECTIVE STATEMENT: When I am told something Pt accompanied by: self  PERTINENT HISTORY: GERD, vit B12 deficiency, vit D deficiency. Pt notes that when she has a conversation, she forgets what she is talking about. She states that she forgets what people say around 5 minutes after they have told her something. 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 When items are missing, pt has to read orders to find which item needs to be found. Pt states that she feels okay about household responsibilities; however, pt as moments where she begins a task, but forgets what she is doing shortly later. Pt experiences communication breakdown with people, especially her husband. She notes that she will often have to ask for multiple repetitions. Pt feels confused during conversations. Pt also experiences difficulties with phone calls with her husband due to difficulties with thought organization.   PAIN:  Are you having pain? No  FALLS: Has patient fallen in last 6 months?  See PT  evaluation for details  LIVING ENVIRONMENT: Lives with: lives with their family, lives with their spouse, and lives with their son Lives in: House/apartment  PLOF:  Level of assistance: Independent with ADLs Employment: Full-time employment  PATIENT GOALS: To get back to work and improve communication.   OBJECTIVE:  Note: Objective measures were completed at Evaluation unless otherwise noted.  DIAGNOSTIC  FINDINGS: EXAM: CT HEAD WITHOUT CONTRAST 11/05/2023 04:00:55 PM   TECHNIQUE: CT of the head was performed without the administration of intravenous contrast. Automated exposure control, iterative reconstruction, and/or weight based adjustment of the mA/kV was utilized to reduce the radiation dose to as low as reasonably achievable.   COMPARISON: None available.   CLINICAL HISTORY: Headache, increasing frequency or severity.   FINDINGS:   BRAIN AND VENTRICLES: No acute hemorrhage. No evidence of acute infarct. No hydrocephalus. No extra-axial collection. No mass effect or midline shift.   ORBITS: No acute abnormality.   SINUSES: No acute abnormality.   SOFT TISSUES AND SKULL: No acute soft tissue abnormality. No skull fracture.   IMPRESSION: 1. No acute intracranial abnormality.  COGNITION: Overall cognitive status: Impaired: Areas of impairment:  Memory: Impaired: Working It Consultant function: Impaired: Slow processing   Functional deficits: Following along in conversations, difficulties with remembering names and important information from conversations.  COGNITIVE COMMUNICATION: Following directions: Follows multi-step commands with increased time  Auditory comprehension: Impaired: Pt requires extra time and repetitions for complex auditory comprehension. Verbal expression: WFL Functional communication: WFL   STANDARDIZED ASSESSMENTS: SLUMS: 26/30  PATIENT REPORTED OUTCOME MEASURES (PROM): Mental Fatigue Scale: 13.5 - She rated a 2 fairly serious problem requiring a full nights sleep after working to her limit, having difficulty concentrating and forming thoughts, being more tearful than usual, an sleeping less than usual. She rated a 1 or a slight problem with reduced cognition in morning and night and better in afternoon and evening, and forgetting things more often than she used to.                                                                                                                              TREATMENT DATE:  01/25/24: Pt plans to return to work on Wednesday. SLP collaborated with pt to determine work requirements. SLP utilize role-playing as a strategy for pt to interact with functional task related to work responsibilities. Pt displayed 100% success with performing functional task (comprehending, organizing, and identifying errors related to how many items in a order) independently. SLP introduced external memory strategies (using sticky notes) to improve short-term memory at work. SLP recommended pt to use sticky notes to keep tally of the amount of items in an order to reduce likelihood of mistake or errors. SLP reviewed energy conservation strategies (resting after getting home from work for the first two weeks) to optimize pt's cognitive health. Pt is making progress with cognitive-communication goals. Plan is to discuss current energy requirements at work due to pt returning  to work this week.   01/13/24: Targeted training compensatory strategies for processing in conversations - including limiting back ground noises and distractions, repeating back what you have heard, talking on the phone seated in a quiet place. See Patient Instructions. Targeted awareness and compensations for cognitive impairments for return to work including using a chair, having a co-worker buddy to alert her to any mistakes or balance concerns, taking breaks in quiet place with closed eyes and not planning long or harder tasks after work.   01/06/24: Evaluation completed. No tx completed this date.    PATIENT EDUCATION: Education details: Evaluation results.  Person educated: Patient Education method: Explanation Education comprehension: verbalized understanding and needs further education   GOALS: Goals reviewed with patient? Yes  SHORT TERM GOALS: Target date: 01/25/24  Pt will describe and utilize at least 2 internal/external memory  strategies to optimize safety and efficiency during structured, functional tasks related to work given occasional min A. Baseline: Goal status: ONGOING  2.  Pt and pt's family will successfully utilize repair strategies during moments of communication breakdown to maximize pt's communicative effectiveness in 80% of opportunities given occasional min A. Baseline:  Goal status: ONGOING  3.  Pt will demonstrate successful thought organization for communication as evident by <3 pauses during moderately complex conversation given occasional min A. Baseline:  Goal status: ONGOING  4.  Pt will develop and perform appropriate sequencing tasks related to work procedures (using external memory strategies) with 100% consistency given occasional min A. Baseline:  Goal status: ONGOING  5.   Pt will complete PROM for post-concussion symptoms.  Baseline:  Goal status: MET  6.  TBD. Baseline:  Goal status: ONGOING  LONG TERM GOALS: Target date: 02/17/24  Pt will utilize memory strategies independently for optimizing communication effectiveness and returning to work. Baseline:  Goal status: ONGOING  2.  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  Baseline:  Goal status: ONGOING  3.  Pt and pt's family will utilize communication support strategies independently to maximize pt's complex auditory comprehension and thought organization during moderately complex conversation. Baseline:  Goal status: ONGOING  4.  Pt will demonstrate anticipatory awareness of potential problems she may encounter at work due to cognitive changes Baseline:  Goal status: INITIAL  5.  TBD Baseline:  Goal status: INITIAL  6.  TBD Baseline:  Goal status: INITIAL  ASSESSMENT:  CLINICAL IMPRESSION: Patient is a 49 y.o. female who was seen today for cognitive evaluation s/p concussion. Pt presents with mild-moderate cognitive-communication  deficits primarily characterized by short-term memory impairment and executive dysfunction due to slow processing. Pt exhibited many moments where she required multiple repetitions and extra time for processing to understand assessment tasks. According to pt's recent hx, she experiences difficulties with her cognition during conversations with family and performing daily tasks requiring short-term memory skills. ST is recommended to optimizing pt's cognitive-communication skills for returning to work and effectively communicating during ADLs. Without ST, pt is at risk of low job performance and lower QOL due to social isolation as a result of cognitive-communication deficits.  OBJECTIVE IMPAIRMENTS: include memory and executive functioning. These impairments are limiting patient from return to work, ADLs/IADLs, and effectively communicating at home and in community. Factors affecting potential to achieve goals and functional outcome are N/A. Patient will benefit from skilled SLP services to address above impairments and improve overall function.  REHAB POTENTIAL: Excellent  PLAN:  SLP FREQUENCY: 2x/week  SLP DURATION:  6 weeks  PLANNED INTERVENTIONS: Environmental controls, Cognitive reorganization, Internal/external aids, Functional tasks, SLP instruction and feedback, Compensatory strategies, Patient/family education, and [id number] Treatment of speech (30 or 45 min)     Waddell Music, M.A., CF-SLP 01/25/2024, 4:28 PM      "

## 2024-01-27 ENCOUNTER — Ambulatory Visit: Payer: Self-pay

## 2024-01-27 ENCOUNTER — Encounter: Payer: Self-pay | Admitting: Physical Therapy

## 2024-01-27 ENCOUNTER — Ambulatory Visit: Payer: MEDICAID | Attending: Family Medicine | Admitting: Physical Therapy

## 2024-01-27 VITALS — BP 123/83 | HR 90

## 2024-01-27 DIAGNOSIS — R42 Dizziness and giddiness: Secondary | ICD-10-CM | POA: Diagnosis present

## 2024-01-27 DIAGNOSIS — H8111 Benign paroxysmal vertigo, right ear: Secondary | ICD-10-CM | POA: Diagnosis present

## 2024-01-27 DIAGNOSIS — R2681 Unsteadiness on feet: Secondary | ICD-10-CM | POA: Diagnosis present

## 2024-01-27 NOTE — Therapy (Signed)
 " OUTPATIENT PHYSICAL THERAPY VESTIBULAR TREATMENT/RE-CERT    Patient Name: Vanessa Butler MRN: [id number] DOB:05-22-1975, 49 y.o., female Today's Date: 01/28/2024  END OF SESSION:  PT End of Session - 01/27/24 1531     Visit Number 9    Number of Visits 12    Date for Recertification  02/27/24   per re-cert on 8/85/73   Authorization Type Worker's Comp    Authorization - Visit Number 9    Authorization - Number of Visits 12    PT Start Time 1530    PT Stop Time 1611    PT Time Calculation (min) 41 min    Equipment Utilized During Treatment Gait belt    Activity Tolerance Patient tolerated treatment well   limited by dizziness with bending   Behavior During Therapy WFL for tasks assessed/performed           Past Medical History:  Diagnosis Date   Abnormal uterine bleeding (AUB)    GERD (gastroesophageal reflux disease)    Grieving    son murdered 78yrs ago per pt on 10-14-2021   Insomnia 11/2018   Skin disorder    seen at [facility] for keratoderma   Vitamin B12 deficiency 12/2018   Vitamin D  deficiency 12/2018   Past Surgical History:  Procedure Laterality Date   COLONOSCOPY  05/10/2021   DILATATION & CURETTAGE/HYSTEROSCOPY WITH MYOSURE N/A 09/21/2019   Procedure: DILATATION & CURETTAGE/HYSTEROSCOPY WITH MYOSURE;  Surgeon: Nicholaus Burnard HERO, MD;  Location: [facility];  Service: Gynecology;  Laterality: N/A;   DILITATION & CURRETTAGE/HYSTROSCOPY WITH HYDROTHERMAL ABLATION N/A 10/24/2021   Procedure: DILATATION & CURETTAGE/HYSTEROSCOPY WITH HYDROTHERMAL ABLATION;  Surgeon: Fredirick Glenys RAMAN, MD;  Location: [facility];  Service: Gynecology;  Laterality: N/A;   FOOT SURGERY Left 2008   HAND SURGERY Left 11/2020   to remove skin disorder keratoderma   lower endo [facility]   07/12/2021   at [facility], benign neuroendocrine tumor  6 mm x 4 mm removed from rectum, repeat lower endo [facility]  in 1 year   TUBAL LIGATION     UPPER GASTROINTESTINAL ENDOSCOPY   05/10/2021   Patient Active Problem List   Diagnosis Date Noted   Carpal tunnel syndrome 01/19/2024   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   Abnormal uterine bleeding (AUB) 08/22/2021   Insomnia 12/13/2018   Hyperglycemia 12/13/2018   Gastroesophageal reflux disease without esophagitis 05/20/2018   Tobacco dependence 02/27/2016   Neuropathy 02/27/2016    PCP: Bascom Borer, NP REFERRING PROVIDER: Artist Lloyd, MD  REFERRING DIAG:  G43.809 (ICD-10-CM) - Other migraine without status migrainosus, not intractable  S06.0X0A (ICD-10-CM) - Concussion without loss of consciousness, initial encounter    THERAPY DIAG:  Unsteadiness on feet  Dizziness and giddiness  BPPV (benign paroxysmal positional vertigo), right  ONSET DATE: 09/23/23   Rationale for Evaluation and Treatment: Rehabilitation  SUBJECTIVE:   SUBJECTIVE STATEMENT: Was sick last week and feeling better this week. Still having a dry cough and it hurts her chest. Was going to call her doctor and see if she can get an appt. Notes everything solid that she eats will give her heart burn and feels like something is sitting in her chest and throat. Notes her throat is not sore. All this started about a week ago. Had to have her esophagus stretched a few years ago. Went back to work today - when she walked back in the building she got a weird feeling. Or unsure if straining from her coughing is making her  dizzy. Notes that bending up and down still made her dizzy at work. Feels like it is staying the same. If she stays down a certain amount of time  like getting something at the bottom of the shelf, will feel dizzy when she gets back up.   Pt accompanied by: self  PERTINENT HISTORY: GERD, vit B12 deficiency, vit D deficiency   PAIN:  Are you having pain? None  Vitals:   01/27/24 1541  BP: 123/83  Pulse: 90     PRECAUTIONS: Fall  WEIGHT BEARING RESTRICTIONS: No  FALLS: Has patient fallen in last 6 months? No  LIVING  ENVIRONMENT: Lives with: lives with their family Lives in: House/apartment Stairs: Yes: Internal: flight steps; on right going up and External: 5-6 steps; on right going up Has following equipment at home: None  PLOF: Independent driving, working full time in warehouse  PATIENT GOALS: to get back to my normal self  OBJECTIVE:  Note: Objective measures were completed at Evaluation unless otherwise noted.  DIAGNOSTIC FINDINGS: 11/05/23 CT Head  IMPRESSION: 1. No acute intracranial abnormality.  COGNITION: Overall cognitive status: Within functional limits for tasks assessed   SENSATION: WFL, but reports CTS in R UE  POSTURE:  rounded shoulders, forward head, increased thoracic kyphosis, posterior pelvic tilt, and flexed trunk   Cervical ROM:   WFL, painfree but slightly sore with R lateral rotation  STRENGTH: WFL   BED MOBILITY:  Intermittent dizziness if supine > sit too quickly, otherwise independent    GAIT: Gait pattern: WFL  PATIENT SURVEYS:  -Post Concussion Symptom Scale (PCSS)  -60/126 (15% normal) - Convergence Insufficiency Symptom Survey   -19/56  VESTIBULAR ASSESSMENT:  GENERAL OBSERVATION: NAD, no AD   SYMPTOM BEHAVIOR:  Subjective history: see above  Non-Vestibular symptoms: changes in vision, neck pain, headaches, tinnitus, nausea/vomiting, and migraine symptoms  Type of dizziness: Blurred Vision, Imbalance (Disequilibrium), Spinning/Vertigo, and weird  Frequency: multiple times/day   Duration: minutes  Aggravating factors: Spontaneous, Induced by position change: supine to sit, Induced by motion: bending down to the ground, turning body quickly, turning head quickly, and driving, Worse in the morning, Worse outside or in busy environment, Occurs when standing still , and Moving eyes  Relieving factors: medication, rest, slow movements, and avoid busy/distracting environments  Progression of symptoms: worse  OCULOMOTOR EXAM:  Ocular  Alignment: normal  Ocular ROM: No Limitations  Spontaneous Nystagmus: absent  Gaze-Induced Nystagmus: absent  Smooth Pursuits: intact  Saccades: intact  Convergence/Divergence: to be assessed                                                                                                                            TREATMENT:  Therapeutic Activity:  Vitals:   01/27/24 1541  BP: 123/83  Pulse: 90   Assessed vitals in RUE and WFL  Goal Assessment: Convergence Insufficiency Symptom Survey: 11/56  Condition 4 of mCTSIB: 30 seconds with mild sway   DVA:  Static: Line  7 Dynamic: Line 4 3 line difference, no dizziness, just felt shaken up  Discussed with pt also making a follow-up with an eye doctor (pt forgot to mention to Dr. Joane at previous vision) due to impaired static vision and continued reports of blurred vision   NMR: Habituation to bending with black and white checkered cloth on floor  Performed holding prolonged bending over by placing 8 cones on the floor and then standing up and then picking them up and turning around and performing the same on the other side. At the end, pt had severe dizziness and felt off balance standing up, needing a seated rest break  Then performed with grabbing cone and then making a semi-circle with cones and staying in prolonged bending position with 8 cones, pt standing up and needing min A from therapist for steadying and pt having severe dizziness and needing to have a seated rest break     PATIENT EDUCATION: Education details: Results of goals, areas to keep working on in PT in regards to habituation with bending/higher level vestibular tasks, continue HEP, adding a few more appts, reaching out to Dr. Joane regarding an eye doctor Person educated: Patient Education method: Explanation, Demonstration, and Verbal cues Education comprehension: verbalized understanding and needs further education  HOME EXERCISE PROGRAM: -Lucious string  3x/day 3 sets of 30s  Standing horizontal VOR x1 30 seconds  with busy background  -Standing habitation to bending   [facility]   Access Code: [email or web address] URL: [email or web address] Date: 01/06/2024 Prepared by: Sheffield Senate  Exercises - Romberg Stance Eyes Closed on Foam Pad  - 1-2 x daily - 5 x weekly - 3 sets - 30 hold - Standing Balance with Eyes Closed on Foam  - 1-2 x daily - 5 x weekly - 2 sets - 10 reps  GOALS: Goals reviewed with patient? Yes  SHORT TERM GOALS: =LTG based on PT POC length    UPDATED/ONGOING LTGS FOR RE-CERT:  LONG TERM GOALS: Target date: 02/01/2024  Pt will be independent with final HEP for improved symptom report and convergence   Baseline: will benefit from updated/additions  Goal status: ON-GOING  2.  Pt will improve DVA to a 3 line difference or less in order to demo improved VOR.  Baseline:5 line difference  3 line difference on 1/14, but pt having less of a line in static vision  Goal status: PARTIALLY MET   3.  Patient will improve score to </= 16 on the CISS to demonstrate an improvement in her convergence symptoms Baseline: 19/56   27/56 (12/22)  11/56 (1/14) Goal status: MET  4. MSQ goal   Baseline: 0/5 on 12/24, goal not needed   Goal status: N/A  5. Pt will demo negative R posterior canalithiasis in order to resolve dizziness with bending over and bed mobility   Baseline: pt cleared at end of session on 12/22. Will need to re-assess and make sure pt stays cleared   Negative on 01/20/24  Goal status: MET   6. Pt will improve condition 4 of mCTSIB to at least 30 seconds in order to demo improved vestibular input for balance.   Baseline:2o seconds  30 seconds (1/14)  Goal status: MET  UPDATED/ON-GOING LTGS FOR RE-CERT:  LONG TERM GOALS: Target date: 02/25/2024  Pt will be independent with final HEP for improved symptom report and convergence   Baseline: will benefit from updated/additions  Goal status:  ON-GOING  2.  Pt will report mild to no dizziness with  prolonged bending tasks in order to demo improved tolerance for work tasks.   Baseline: severe dizziness/unsteadiness with prolonged bending Goal status: NEW  3.  DHI to be assessed with LTG written.   Baseline: Goal status: NEW  ASSESSMENT:  CLINICAL IMPRESSION: Today's skilled session focused on assessing LTGs for anticipated re-cert. Pt met LTGs 3, 5, and 6. Pt improved score on convergence insufficiency symptom scale, indicating improved visual deficits, but pt with continued blurred vision. Pt also with decr static vision with DVA testing, so do recommend pt sees an eye doctor regarding this. Pt planning on reaching out to Dr. Joane about this. Pt remains cleared from BPPV and was able to improve condition 4 of mCTSIB to 30 seconds, indicating improved vestibular input for balance. With DVA testing, pt with a 3 line difference, but can definitely be impacted by decr static vision. Pt returned to work today and continued to report incr dizziness with prolonged bending. Worked on habituation tasks to bending today with a busy floor, pt pt having severe dizziness with tasks and needing frequent seated rest breaks. Pt has made great progress and will continue to benefit from skilled PT to address dizziness and work on habituation to bending for pt to return to work tasks. LTGs updated as appropriate.    OBJECTIVE IMPAIRMENTS: decreased activity tolerance, decreased knowledge of condition, dizziness, impaired vision/preception, and pain.   ACTIVITY LIMITATIONS: carrying, lifting, bending, standing, hygiene/grooming, locomotion level, and caring for others  PARTICIPATION LIMITATIONS: meal prep, cleaning, interpersonal relationship, driving, shopping, community activity, occupation, and yard work  PERSONAL FACTORS: Age, Fitness, Past/current experiences, Profession, Sex, Social background, and Time since onset of injury/illness/exacerbation  are also affecting patient's functional outcome.   REHAB POTENTIAL: Good  CLINICAL DECISION MAKING: Stable/uncomplicated  EVALUATION COMPLEXITY: Low   PLAN:  PT FREQUENCY: 2x/week  PT DURATION: 4 weeks, plus an additional 1x week for 4 weeks for re-cert   PLANNED INTERVENTIONS: 97164- PT Re-evaluation, 97750- Physical Performance Testing, 97110-Therapeutic exercises, 97530- Therapeutic activity, 97112- Neuromuscular re-education, 97535- Self Care, [id number]- Manual therapy, [phone]- Gait training, [phone]- Canalith repositioning, [phone]- Aquatic Therapy, Patient/Family education, Balance training, Stair training, Vestibular training, Visual/preceptual remediation/compensation, Cognitive remediation, and DME instructions  PLAN FOR NEXT SESSION: Cont balance/vestibular exercises  - trampoline, rockerboard, Bosu; higher level VOR tasks with busy backgrounds, work on habituation to bending!!   Have pt fill out [facility]    Sheffield Senate, PT, DPT 01/28/24 9:40 AM     "

## 2024-01-27 NOTE — Telephone Encounter (Signed)
 FYI Only or Action Required?: Action required by provider: request for appointment, clinical question for provider, and update on patient condition.  Patient was last seen in primary care on 11/12/2023 by [name], NP.  Called Nurse Triage reporting Chest Pain.  Symptoms began a week ago.  Interventions attempted: Prescription medications: omeprazole .  Symptoms are: gradually worsening.  Triage Disposition: Call PCP Within 24 Hours  Patient/caregiver understands and will follow disposition?: Yes    Copied from CRM #[id number]. Topic: Clinical - Red Word Triage >> Jan 27, 2024  4:44 PM Vanessa Butler wrote: Red Word that prompted transfer to Nurse Triage: Bad cough, feels like something is sitting in chest. Can't eat solids but can eat soup. Has acid reflux. Heartburn after eating solids.    Reason for Disposition  [1] Patient says chest pain feels exactly the same as previously diagnosed heartburn AND [2] describes burning in chest AND [3] accompanying sour taste in mouth  Answer Assessment - Initial Assessment Questions Pt called in to report cough with associated esophageal and chest discomfort. Pt does have hx of esophageal stretching, last procedure approx 4 years ago. Pt states she take omeprazole  daily for GERD and unsure if she is due for another surgery. Pt states that she has no true pain, more discomfort. NO SOB, no fever, no n/v. Pt states that symptoms have been present for at least 1 week if not longer. She states she eats soup and applesauce. Her last solid meal was Sunday; rice, gravy and beef tips. Reports it took her until yesterday to consume whole meal. She states she takes 4-5 bites as she does have an appetite but burning is uncomfortable. Per schedule no appts in clinic until 01/20, advised pt we would call back with appt. Pt has returned to work since concussion and states she would need afternoon appt, after 3pm if possible. Discussed red flag symptoms that  would signal to pt to go to ED: chest pain, radiation of pain to shoulder/jaw, changing in HR. Pt voiced understanding.     1. LOCATION: Where does it hurt?       Esophageal and chest discomfort; pt denies true chest pain. Pt did report sour taste in mouth   2. RADIATION: Does the pain go anywhere else? (e.g., into neck, jaw, arms, back)     No   3. ONSET: When did the chest pain begin? (Minutes, hours or days)      X 1 week, possibly longer   4. PATTERN: Does the pain come and go, or has it been constant since it started?  Does it get worse with exertion?      Comes and goes; worse after eating solid food or coughing   5. DURATION: How long does it last (e.g., seconds, minutes, hours)     Comes and goes; pt states improves with omeprazole    6. SEVERITY: How bad is the pain?  (e.g., Scale 1-10; mild, moderate, or severe)     Denies any true pain, more discomfort   7. CARDIAC RISK FACTORS: Do you have any history of heart problems or risk factors for heart disease? (e.g., angina, prior heart attack; diabetes, high blood pressure, high cholesterol, smoker, or strong family history of heart disease)     No   8. PULMONARY RISK FACTORS: Do you have any history of lung disease?  (e.g., blood clots in lung, asthma, emphysema, birth control pills)     No   9. CAUSE: What do you think is  causing the chest pain?     Unsure   10. OTHER SYMPTOMS: Do you have any other symptoms? (e.g., dizziness, nausea, vomiting, sweating, fever, difficulty breathing, cough)       Cough  Protocols used: Chest Pain-A-AH

## 2024-01-28 NOTE — Telephone Encounter (Signed)
 Pt was made an appt.KH

## 2024-01-29 ENCOUNTER — Telehealth: Payer: MEDICAID | Admitting: Nurse Practitioner

## 2024-01-29 DIAGNOSIS — R1310 Dysphagia, unspecified: Secondary | ICD-10-CM

## 2024-01-29 DIAGNOSIS — R12 Heartburn: Secondary | ICD-10-CM

## 2024-01-29 DIAGNOSIS — R059 Cough, unspecified: Secondary | ICD-10-CM

## 2024-01-29 MED ORDER — METOCLOPRAMIDE HCL 10 MG PO TABS: 10.0000 mg | ORAL_TABLET | Freq: Four times a day (QID) | ORAL | 0 refills | Status: AC

## 2024-01-29 NOTE — Progress Notes (Signed)
 Virtual Visit via Video Note  I connected with Vanessa Butler on 01/29/24 at  1:20 PM EST by a video enabled telemedicine application and verified that I am speaking with the correct person using two identifiers.  Location: Patient: home Provider: office   I discussed the limitations of evaluation and management by telemedicine and the availability of in person appointments. The patient expressed understanding and agreed to proceed.  History of Present Illness:  Patient presents today through video visit for an acute visit.  She states for the past several days she has been having difficulty swallowing.  She has been having acid reflux.  She is taking omeprazole .  She states that she has not been taking her Reglan .  It we will call in a refill for this today.  Patient has had this issue before and did have to have her esophagus stretched.  We will place a referral for her back to GI. Denies f/c/s, n/v/d, hemoptysis, PND, leg swelling Denies chest pain or edema     Observations/Objective:     01/27/2024    3:41 PM 01/18/2024    1:33 PM 01/04/2024    3:00 PM  Vitals with BMI  Height  5' 0   Weight  140 lbs   BMI  27.34   Systolic 123 118 868  Diastolic 83 78 72  Pulse 90 77 78      Assessment and Plan:  1. Heart burn (Primary)  Continue omeprazole   Will restart Reglan   Sleep with the head of the bed elevated  Bland soft diet until seen by GI  2. Cough, unspecified type  Continue omeprazole  and Reglan    3. Dysphagia, unspecified type  - Ambulatory referral to Gastroenterology     I discussed the assessment and treatment plan with the patient. The patient was provided an opportunity to ask questions and all were answered. The patient agreed with the plan and demonstrated an understanding of the instructions.   The patient was advised to call back or seek an in-person evaluation if the symptoms worsen or if the condition fails to improve as anticipated.  I  provided 23 minutes of non-face-to-face time during this encounter.   Bascom GORMAN Borer, NP

## 2024-02-01 ENCOUNTER — Ambulatory Visit: Payer: Worker's Compensation

## 2024-02-01 DIAGNOSIS — R41841 Cognitive communication deficit: Secondary | ICD-10-CM

## 2024-02-01 DIAGNOSIS — R498 Other voice and resonance disorders: Secondary | ICD-10-CM

## 2024-02-01 DIAGNOSIS — R2681 Unsteadiness on feet: Secondary | ICD-10-CM | POA: Diagnosis not present

## 2024-02-01 NOTE — Therapy (Signed)
 " OUTPATIENT SPEECH LANGUAGE PATHOLOGY TREATMENT   Patient Name: Vanessa Butler MRN: [id number] DOB:01-Apr-1975, 49 y.o., female Today's Date: 02/01/2024  PCP: [name], NP REFERRING PROVIDER: Joane Artist RAMAN, MD  END OF SESSION:  End of Session - 02/01/24 1530     Visit Number 4    Number of Visits 13    Date for Recertification  02/17/24    SLP Start Time 1449    SLP Stop Time  1530    SLP Time Calculation (min) 41 min    Activity Tolerance Patient tolerated treatment well            Past Medical History:  Diagnosis Date   Abnormal uterine bleeding (AUB)    GERD (gastroesophageal reflux disease)    Grieving    son murdered 78yrs ago per pt on 10-14-2021   Insomnia 11/2018   Skin disorder    seen at [facility] for keratoderma   Vitamin B12 deficiency 12/2018   Vitamin D  deficiency 12/2018   Past Surgical History:  Procedure Laterality Date   COLONOSCOPY  05/10/2021   DILATATION & CURETTAGE/HYSTEROSCOPY WITH MYOSURE N/A 09/21/2019   Procedure: DILATATION & CURETTAGE/HYSTEROSCOPY WITH MYOSURE;  Surgeon: Nicholaus Burnard HERO, MD;  Location: [facility];  Service: Gynecology;  Laterality: N/A;   DILITATION & CURRETTAGE/HYSTROSCOPY WITH HYDROTHERMAL ABLATION N/A 10/24/2021   Procedure: DILATATION & CURETTAGE/HYSTEROSCOPY WITH HYDROTHERMAL ABLATION;  Surgeon: Fredirick Glenys RAMAN, MD;  Location: [facility];  Service: Gynecology;  Laterality: N/A;   FOOT SURGERY Left 2008   HAND SURGERY Left 11/2020   to remove skin disorder keratoderma   lower endo [facility]   07/12/2021   at [facility], benign neuroendocrine tumor  6 mm x 4 mm removed from rectum, repeat lower endo [facility]  in 1 year   TUBAL LIGATION     UPPER GASTROINTESTINAL ENDOSCOPY  05/10/2021   Patient Active Problem List   Diagnosis Date Noted   Carpal tunnel syndrome 01/19/2024   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   Abnormal uterine bleeding (AUB) 08/22/2021   Insomnia 12/13/2018    Hyperglycemia 12/13/2018   Gastroesophageal reflux disease without esophagitis 05/20/2018   Tobacco dependence 02/27/2016   Neuropathy 02/27/2016    ONSET DATE: 12/15/2023 (Referral date)   REFERRING DIAG:  S06.0X0A (ICD-10-CM) - Concussion without loss of consciousness, initial encounter    THERAPY DIAG:  Cognitive communication deficit  Other voice and resonance disorders  Rationale for Evaluation and Treatment: Rehabilitation  SUBJECTIVE:   SUBJECTIVE STATEMENT: When I am told something Pt accompanied by: self  PERTINENT HISTORY: GERD, vit B12 deficiency, vit D deficiency. Pt notes that when she has a conversation, she forgets what she is talking about. She states that she forgets what people say around 5 minutes after they have told her something. 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 When items are missing, pt has to read orders to find which item needs to be found. Pt states that she feels okay about household responsibilities; however, pt as moments where she begins a task, but forgets what she is doing shortly later. Pt experiences communication breakdown with people, especially her husband. She notes that she will often have to ask for multiple repetitions. Pt feels confused during conversations. Pt also experiences difficulties with phone calls with her husband due to difficulties with thought organization.   PAIN:  Are you having pain? No  FALLS: Has patient fallen  in last 6 months?  See PT evaluation for details  LIVING ENVIRONMENT: Lives with: lives with their family, lives with their spouse, and lives with their son Lives in: House/apartment  PLOF:  Level of assistance: Independent with ADLs Employment: Full-time employment  PATIENT GOALS: To get back to work and improve communication.   OBJECTIVE:  Note: Objective measures were completed  at Evaluation unless otherwise noted.  DIAGNOSTIC FINDINGS: EXAM: CT HEAD WITHOUT CONTRAST 11/05/2023 04:00:55 PM   TECHNIQUE: CT of the head was performed without the administration of intravenous contrast. Automated exposure control, iterative reconstruction, and/or weight based adjustment of the mA/kV was utilized to reduce the radiation dose to as low as reasonably achievable.   COMPARISON: None available.   CLINICAL HISTORY: Headache, increasing frequency or severity.   FINDINGS:   BRAIN AND VENTRICLES: No acute hemorrhage. No evidence of acute infarct. No hydrocephalus. No extra-axial collection. No mass effect or midline shift.   ORBITS: No acute abnormality.   SINUSES: No acute abnormality.   SOFT TISSUES AND SKULL: No acute soft tissue abnormality. No skull fracture.   IMPRESSION: 1. No acute intracranial abnormality.  COGNITION: Overall cognitive status: Impaired: Areas of impairment:  Memory: Impaired: Working It Consultant function: Impaired: Slow processing   Functional deficits: Following along in conversations, difficulties with remembering names and important information from conversations.  COGNITIVE COMMUNICATION: Following directions: Follows multi-step commands with increased time  Auditory comprehension: Impaired: Pt requires extra time and repetitions for complex auditory comprehension. Verbal expression: WFL Functional communication: WFL   STANDARDIZED ASSESSMENTS: SLUMS: 26/30  PATIENT REPORTED OUTCOME MEASURES (PROM): Mental Fatigue Scale: 13.5 - She rated a 2 fairly serious problem requiring a full nights sleep after working to her limit, having difficulty concentrating and forming thoughts, being more tearful than usual, an sleeping less than usual. She rated a 1 or a slight problem with reduced cognition in morning and night and better in afternoon and evening, and forgetting things more often than she used to.                                                                                                                              TREATMENT DATE:  02/01/24: Pt has returned to work. Pt is readjusting to her job; however, she feels that she is adjusting well with no significant difficulties noted. She states that she was placed in a work station that has her computer on the left side, where she is used to it on the right side. SLP educated pt in advocating for a right side computer to support job readjustment for her cognition. She notes that she has spoken with her higher-ups and is waiting for a spot to become available. Pt notes that she has had a persistent cough and a feeling of a lump in her throat. SLP had pt complete Reflux Symptom Index (RSI) to analyze pt's current symptoms. Pt scored 39/45 on RSI. SLP instructed pt in cough  suppression strategies (Rescue Breathing - Blow out on a /shhhhh/, followed by deep breath) to minimize persistent coughing for QOL. Pt successfully demonstrated the strategy during 2 coughing episodes. Pt is agreeable to using strategy at home to reduce incidence of coughing. Plan is to continue cognitive-communication support building and review cough strategies to maximize pt QOL and safety during ADLs.  01/25/24: Pt plans to return to work on Wednesday. SLP collaborated with pt to determine work requirements. SLP utilize role-playing as a strategy for pt to interact with functional task related to work responsibilities. Pt displayed 100% success with performing functional task (comprehending, organizing, and identifying errors related to how many items in a order) independently. SLP introduced external memory strategies (using sticky notes) to improve short-term memory at work. SLP recommended pt to use sticky notes to keep tally of the amount of items in an order to reduce likelihood of mistake or errors. SLP reviewed energy conservation strategies (resting after getting home from work  for the first two weeks) to optimize pt's cognitive health. Pt is making progress with cognitive-communication goals. Plan is to discuss current energy requirements at work due to pt returning to work this week.   01/13/24: Targeted training compensatory strategies for processing in conversations - including limiting back ground noises and distractions, repeating back what you have heard, talking on the phone seated in a quiet place. See Patient Instructions. Targeted awareness and compensations for cognitive impairments for return to work including using a chair, having a co-worker buddy to alert her to any mistakes or balance concerns, taking breaks in quiet place with closed eyes and not planning long or harder tasks after work.   01/06/24: Evaluation completed. No tx completed this date.    PATIENT EDUCATION: Education details: Evaluation results.  Person educated: Patient Education method: Explanation Education comprehension: verbalized understanding and needs further education   GOALS: Goals reviewed with patient? Yes  SHORT TERM GOALS: Target date: 01/25/24  Pt will describe and utilize at least 2 internal/external memory strategies to optimize safety and efficiency during structured, functional tasks related to work given occasional min A. Baseline: Goal status: ONGOING  2.  Pt and pt's family will successfully utilize repair strategies during moments of communication breakdown to maximize pt's communicative effectiveness in 80% of opportunities given occasional min A. Baseline:  Goal status: ONGOING  3.  Pt will demonstrate successful thought organization for communication as evident by <3 pauses during moderately complex conversation given occasional min A. Baseline:  Goal status: ONGOING  4.  Pt will develop and perform appropriate sequencing tasks related to work procedures (using external memory strategies) with 100% consistency given occasional min A. Baseline:  Goal  status: ONGOING  5.   Pt will complete PROM for post-concussion symptoms.  Baseline:  Goal status: MET  6.  TBD. Baseline:  Goal status: ONGOING  LONG TERM GOALS: Target date: 02/17/24  Pt will utilize memory strategies independently for optimizing communication effectiveness and returning to work. Baseline:  Goal status: ONGOING  2.  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  Baseline:  Goal status: ONGOING  3.  Pt and pt's family will utilize communication support strategies independently to maximize pt's complex auditory comprehension and thought organization during moderately complex conversation. Baseline:  Goal status: ONGOING  4.  Pt will demonstrate anticipatory awareness of potential problems she may encounter at work due to cognitive changes Baseline:  Goal status: INITIAL  5.  TBD Baseline:  Goal status: INITIAL  6.  TBD Baseline:  Goal status: INITIAL  ASSESSMENT:  CLINICAL IMPRESSION: Patient is a 49 y.o. female who was seen today for cognitive evaluation s/p concussion. Pt presents with mild-moderate cognitive-communication deficits primarily characterized by short-term memory impairment and executive dysfunction due to slow processing. Pt exhibited many moments where she required multiple repetitions and extra time for processing to understand assessment tasks. According to pt's recent hx, she experiences difficulties with her cognition during conversations with family and performing daily tasks requiring short-term memory skills. ST is recommended to optimizing pt's cognitive-communication skills for returning to work and effectively communicating during ADLs. Without ST, pt is at risk of low job performance and lower QOL due to social isolation as a result of cognitive-communication deficits.  OBJECTIVE IMPAIRMENTS: include memory and executive functioning. These impairments are  limiting patient from return to work, ADLs/IADLs, and effectively communicating at home and in community. Factors affecting potential to achieve goals and functional outcome are N/A. Patient will benefit from skilled SLP services to address above impairments and improve overall function.  REHAB POTENTIAL: Excellent  PLAN:  SLP FREQUENCY: 2x/week  SLP DURATION: 6 weeks  PLANNED INTERVENTIONS: Environmental controls, Cognitive reorganization, Internal/external aids, Functional tasks, SLP instruction and feedback, Compensatory strategies, Patient/family education, and [id number] Treatment of speech (30 or 45 min)     Waddell Music, M.A., CF-SLP 02/01/2024, 3:35 PM      "

## 2024-02-01 NOTE — Patient Instructions (Signed)
 Cough suppression strategy (Rescue Breathing) -   Blow out on a shhhhh(Like you are shushing someone) After blowing out, take a deep breath. Repeat until the cough feels under control.   You can also use this strategy when you first feel a tickle or tingle in your throat.

## 2024-02-02 ENCOUNTER — Encounter: Payer: Self-pay | Admitting: Gastroenterology

## 2024-02-03 ENCOUNTER — Ambulatory Visit: Payer: Worker's Compensation | Admitting: Speech Pathology

## 2024-02-03 ENCOUNTER — Encounter: Payer: Self-pay | Admitting: Speech Pathology

## 2024-02-03 DIAGNOSIS — R2681 Unsteadiness on feet: Secondary | ICD-10-CM | POA: Diagnosis not present

## 2024-02-03 DIAGNOSIS — R41841 Cognitive communication deficit: Secondary | ICD-10-CM

## 2024-02-03 DIAGNOSIS — R498 Other voice and resonance disorders: Secondary | ICD-10-CM

## 2024-02-03 NOTE — Patient Instructions (Addendum)
" ° °  Consider kefir or drinkable yogurt  Consider making a smoothie with protein powder  Ensure or Boost  Do your breathing as soon as you feel a tickle or lump or cough coming on to try to prevent it  This week - try to be more aware of throat clears and avoid them - instead swallow hard or take a sip, then do some breathing  Weigh so you can tell the GI doc how much you lost  Practice your breathing throughout the day -   If you know that you will be exposed to a trigger - do your breathing before you are exposed to try to prevent the cough   "

## 2024-02-03 NOTE — Therapy (Signed)
 " OUTPATIENT SPEECH LANGUAGE PATHOLOGY TREATMENT   Patient Name: Vanessa Butler MRN: [id number] DOB:Nov 10, 1975, 49 y.o., female Today's Date: 02/03/2024  PCP: [name], NP REFERRING PROVIDER: Joane Artist RAMAN, MD  END OF SESSION:  End of Session - 02/03/24 1016     Visit Number 5    Number of Visits 13    Date for Recertification  02/17/24    SLP Start Time 1016    SLP Stop Time  1100    SLP Time Calculation (min) 44 min    Activity Tolerance Patient tolerated treatment well            Past Medical History:  Diagnosis Date   Abnormal uterine bleeding (AUB)    GERD (gastroesophageal reflux disease)    Grieving    son murdered 39yrs ago per pt on 10-14-2021   Insomnia 11/2018   Skin disorder    seen at [facility] for keratoderma   Vitamin B12 deficiency 12/2018   Vitamin D  deficiency 12/2018   Past Surgical History:  Procedure Laterality Date   COLONOSCOPY  05/10/2021   DILATATION & CURETTAGE/HYSTEROSCOPY WITH MYOSURE N/A 09/21/2019   Procedure: DILATATION & CURETTAGE/HYSTEROSCOPY WITH MYOSURE;  Surgeon: Nicholaus Burnard HERO, MD;  Location: [facility];  Service: Gynecology;  Laterality: N/A;   DILITATION & CURRETTAGE/HYSTROSCOPY WITH HYDROTHERMAL ABLATION N/A 10/24/2021   Procedure: DILATATION & CURETTAGE/HYSTEROSCOPY WITH HYDROTHERMAL ABLATION;  Surgeon: Fredirick Glenys RAMAN, MD;  Location: [facility];  Service: Gynecology;  Laterality: N/A;   FOOT SURGERY Left 2008   HAND SURGERY Left 11/2020   to remove skin disorder keratoderma   lower endo [facility]   07/12/2021   at [facility], benign neuroendocrine tumor  6 mm x 4 mm removed from rectum, repeat lower endo [facility]  in 1 year   TUBAL LIGATION     UPPER GASTROINTESTINAL ENDOSCOPY  05/10/2021   Patient Active Problem List   Diagnosis Date Noted   Carpal tunnel syndrome 01/19/2024   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   Abnormal uterine bleeding (AUB) 08/22/2021   Insomnia 12/13/2018    Hyperglycemia 12/13/2018   Gastroesophageal reflux disease without esophagitis 05/20/2018   Tobacco dependence 02/27/2016   Neuropathy 02/27/2016    ONSET DATE: 12/15/2023 (Referral date)   REFERRING DIAG:  S06.0X0A (ICD-10-CM) - Concussion without loss of consciousness, initial encounter    THERAPY DIAG:  Other voice and resonance disorders  Cognitive communication deficit  Rationale for Evaluation and Treatment: Rehabilitation  SUBJECTIVE:   SUBJECTIVE STATEMENT: When I am told something Pt accompanied by: self  PERTINENT HISTORY: GERD, vit B12 deficiency, vit D deficiency. Pt notes that when she has a conversation, she forgets what she is talking about. She states that she forgets what people say around 5 minutes after they have told her something. 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 When items are missing, pt has to read orders to find which item needs to be found. Pt states that she feels okay about household responsibilities; however, pt as moments where she begins a task, but forgets what she is doing shortly later. Pt experiences communication breakdown with people, especially her husband. She notes that she will often have to ask for multiple repetitions. Pt feels confused during conversations. Pt also experiences difficulties with phone calls with her husband due to difficulties with thought organization.   PAIN:  Are you having pain? No  FALLS: Has patient fallen  in last 6 months?  See PT evaluation for details  LIVING ENVIRONMENT: Lives with: lives with their family, lives with their spouse, and lives with their son Lives in: House/apartment  PLOF:  Level of assistance: Independent with ADLs Employment: Full-time employment  PATIENT GOALS: To get back to work and improve communication.   OBJECTIVE:  Note: Objective measures were completed  at Evaluation unless otherwise noted.  DIAGNOSTIC FINDINGS: EXAM: CT HEAD WITHOUT CONTRAST 11/05/2023 04:00:55 PM   TECHNIQUE: CT of the head was performed without the administration of intravenous contrast. Automated exposure control, iterative reconstruction, and/or weight based adjustment of the mA/kV was utilized to reduce the radiation dose to as low as reasonably achievable.   COMPARISON: None available.   CLINICAL HISTORY: Headache, increasing frequency or severity.   FINDINGS:   BRAIN AND VENTRICLES: No acute hemorrhage. No evidence of acute infarct. No hydrocephalus. No extra-axial collection. No mass effect or midline shift.   ORBITS: No acute abnormality.   SINUSES: No acute abnormality.   SOFT TISSUES AND SKULL: No acute soft tissue abnormality. No skull fracture.   IMPRESSION: 1. No acute intracranial abnormality.  COGNITION: Overall cognitive status: Impaired: Areas of impairment:  Memory: Impaired: Working It Consultant function: Impaired: Slow processing   Functional deficits: Following along in conversations, difficulties with remembering names and important information from conversations.  COGNITIVE COMMUNICATION: Following directions: Follows multi-step commands with increased time  Auditory comprehension: Impaired: Pt requires extra time and repetitions for complex auditory comprehension. Verbal expression: WFL Functional communication: WFL   STANDARDIZED ASSESSMENTS: SLUMS: 26/30  PATIENT REPORTED OUTCOME MEASURES (PROM): Mental Fatigue Scale: 13.5 - She rated a 2 fairly serious problem requiring a full nights sleep after working to her limit, having difficulty concentrating and forming thoughts, being more tearful than usual, an sleeping less than usual. She rated a 1 or a slight problem with reduced cognition in morning and night and better in afternoon and evening, and forgetting things more often than she used to.                                                                                                                              TREATMENT DATE:   02/03/24: She continues to report success at work - she is not making mistakes at work due to cognition - she continues to report with divided attention and some mistakes outside of work due to getting distractions. Education re: attention vs memory - generated strategies to support attention - including self advocating, educating her family - and limiting interruptions when she is working on a task. Vanessa Butler has implemented cough suppression strategies of abdominal sniff and sh exhale, taking sips and doing hard swallows. She reports coughing has been frequent, but she is able to control cough with breathing. Today, noted multiple throat clearing - she reports globus in throat and sternum. She required frequent mod verbal cues to be aware of throat clearing behavior- trained in throat  clear alternatives including sips,hard swallows and rescue breathing. She identifies scents as cough trigger as well. She also had coughing episode during emotional conversation today, indicating possible stress trigger as well. Vanessa Butler demonstrated cough suppression 3x today with usual mod modeling, instruction and verbal cues to not talk until sensation has passed.  Goals added  2024-02-05: Pt has returned to work. Pt is readjusting to her job; however, she feels that she is adjusting well with no significant difficulties noted. She states that she was placed in a work station that has her computer on the left side, where she is used to it on the right side. SLP educated pt in advocating for a right side computer to support job readjustment for her cognition. She notes that she has spoken with her higher-ups and is waiting for a spot to become available. Pt notes that she has had a persistent cough and a feeling of a lump in her throat. SLP had pt complete Reflux Symptom Index (RSI) to analyze pt's  current symptoms. Pt scored 39/45 on RSI. SLP instructed pt in cough suppression strategies (Rescue Breathing - Blow out on a /shhhhh/, followed by deep breath) to minimize persistent coughing for QOL. Pt successfully demonstrated the strategy during 2 coughing episodes. Pt is agreeable to using strategy at home to reduce incidence of coughing. Plan is to continue cognitive-communication support building and review cough strategies to maximize pt QOL and safety during ADLs.  01/25/24: Pt plans to return to work on Wednesday. SLP collaborated with pt to determine work requirements. SLP utilize role-playing as a strategy for pt to interact with functional task related to work responsibilities. Pt displayed 100% success with performing functional task (comprehending, organizing, and identifying errors related to how many items in a order) independently. SLP introduced external memory strategies (using sticky notes) to improve short-term memory at work. SLP recommended pt to use sticky notes to keep tally of the amount of items in an order to reduce likelihood of mistake or errors. SLP reviewed energy conservation strategies (resting after getting home from work for the first two weeks) to optimize pt's cognitive health. Pt is making progress with cognitive-communication goals. Plan is to discuss current energy requirements at work due to pt returning to work this week.   01/13/24: Targeted training compensatory strategies for processing in conversations - including limiting back ground noises and distractions, repeating back what you have heard, talking on the phone seated in a quiet place. See Patient Instructions. Targeted awareness and compensations for cognitive impairments for return to work including using a chair, having a co-worker buddy to alert her to any mistakes or balance concerns, taking breaks in quiet place with closed eyes and not planning long or harder tasks after work.   01/06/24:  Evaluation completed. No tx completed this date.    PATIENT EDUCATION: Education details: Evaluation results.  Person educated: Patient Education method: Explanation Education comprehension: verbalized understanding and needs further education   GOALS: Goals reviewed with patient? Yes  SHORT TERM GOALS: Target date: 01/25/24  Pt will describe and utilize at least 2 internal/external memory strategies to optimize safety and efficiency during structured, functional tasks related to work given occasional min A. Baseline: Goal status: MET  2.  Pt and pt's family will successfully utilize repair strategies during moments of communication breakdown to maximize pt's communicative effectiveness in 80% of opportunities given occasional min A. Baseline:  Goal status: MET  3.  Pt will demonstrate successful thought organization for communication as evident  by <3 pauses during moderately complex conversation given occasional min A. Baseline:  Goal status: MET  4.  Pt will develop and perform appropriate sequencing tasks related to work procedures (using external memory strategies) with 100% consistency given occasional min A. Baseline:  Goal status: ONGOING  5.   Pt will complete PROM for post-concussion symptoms.  Baseline:  Goal status: MET  6.  TBD. Baseline:  Goal status: ONGOING  LONG TERM GOALS: Target date: 02/17/24  Pt will utilize memory strategies independently for optimizing communication effectiveness and returning to work. Baseline:  Goal status: ONGOING  2.  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  Baseline:  Goal status: ONGOING  3.  Pt and pt's family will utilize communication support strategies independently to maximize pt's complex auditory comprehension and thought organization during moderately complex conversation. Baseline:  Goal status: ONGOING  4.  Pt will demonstrate  anticipatory awareness of potential problems she may encounter at work due to cognitive changes Baseline:  Goal status: INITIAL  5.  Pt will utilize cough suppression strategies as needed with occasional min A over 2 sessions Baseline:  Goal status: INITIAL  6.  Pt will avoid throat clearing behavior to have 2 or less throat clears a session with occasional min A Baseline:  Goal status: INITIAL  ASSESSMENT:  CLINICAL IMPRESSION: Patient is a 49 y.o. female who was seen today for cognitive evaluation s/p concussion. Pt presents with mild-moderate cognitive-communication deficits primarily characterized by short-term memory impairment and executive dysfunction due to slow processing. Pt exhibited many moments where she required multiple repetitions and extra time for processing to understand assessment tasks. According to pt's recent hx, she experiences difficulties with her cognition during conversations with family and performing daily tasks requiring short-term memory skills. ST is recommended to optimizing pt's cognitive-communication skills for returning to work and effectively communicating during ADLs. Without ST, pt is at risk of low job performance and lower QOL due to social isolation as a result of cognitive-communication deficits.  OBJECTIVE IMPAIRMENTS: include memory and executive functioning. These impairments are limiting patient from return to work, ADLs/IADLs, and effectively communicating at home and in community. Factors affecting potential to achieve goals and functional outcome are N/A. Patient will benefit from skilled SLP services to address above impairments and improve overall function.  REHAB POTENTIAL: Excellent  PLAN:  SLP FREQUENCY: 2x/week  SLP DURATION: 6 weeks  PLANNED INTERVENTIONS: Environmental controls, Cognitive reorganization, Internal/external aids, Functional tasks, SLP instruction and feedback, Compensatory strategies, Patient/family education, and  [id number] Treatment of speech (30 or 45 min)     Leita Hoehn MS, CCC-SLP 02/03/2024, 10:55 AM      "

## 2024-02-05 ENCOUNTER — Ambulatory Visit: Payer: MEDICAID | Admitting: Gastroenterology

## 2024-02-05 ENCOUNTER — Encounter: Payer: Self-pay | Admitting: Gastroenterology

## 2024-02-05 VITALS — BP 106/64 | HR 84 | Ht 59.75 in | Wt 136.5 lb

## 2024-02-05 DIAGNOSIS — D3A026 Benign carcinoid tumor of the rectum: Secondary | ICD-10-CM | POA: Diagnosis not present

## 2024-02-05 DIAGNOSIS — R059 Cough, unspecified: Secondary | ICD-10-CM

## 2024-02-05 DIAGNOSIS — R09A2 Foreign body sensation, throat: Secondary | ICD-10-CM

## 2024-02-05 DIAGNOSIS — R1310 Dysphagia, unspecified: Secondary | ICD-10-CM

## 2024-02-05 DIAGNOSIS — K219 Gastro-esophageal reflux disease without esophagitis: Secondary | ICD-10-CM | POA: Diagnosis not present

## 2024-02-05 NOTE — Progress Notes (Signed)
 "  HPI :  49 year old female with a history of rectal carcinoid, GERD, dysphagia, or here to reestablish care for some of these issues.  She was previously followed by Dr. Eda of our office, she was last seen in April 2023.  She states about 3 weeks ago, she felt worsening cough.  Initially thought she had a virus but conservative measures did not improve it and she did not feel sick like she had a virus, more so just cough.  Then she developed globus, knots in her chest and difficulty swallowing.  She states she has had some pyrosis and waterbrash.  Has dysphagia to both solids and liquids.  Denies early satiety, denies nausea or vomiting.  Denies abdominal pains.  She has been on omeprazole  40 mg twice daily, states it has not really done much yet.  She was empirically given Reglan  10 mg every 6 hours for which she has been taking for the past several days and states that has not provided any benefit either.  Looking through her chart she complained of dysphagia back in April 2023.  Dr. Eda performed an EGD which was normal.  She had biopsies that were negative for EOE and empiric dilation to 18 mm.  She thinks that may have helped some of her symptoms at the time although hard to say for sure.  She also had a colonoscopy at that time which revealed a rectal carcinoid roughly 15 mm in size reportedly.  She was referred to [facility] and had an EUS with resection of that consistent with a carcinoid.  That was performed in June 2023.  They recommended a repeat EUS 1 year later for surveillance.  She has not had that done yet.  She was unaware that she needed follow-up for the rectal carcinoid.    Of note I see that she has had a history of B12 deficiency, in the setting of PPI use.  She is not sure if she is taking B12 at home or not, she knows she has been on it in the past but cannot recall if she is currently taking it.She has had a persistent macrocytosis over time, last hemoglobin 13.3 back in  October but MCV of 100   Prior workup: EGD 05/10/21: - No endoscopic abnormality was evident in the esophagus to explain the patient's complaint of dysphagia. It was decided, however, to proceed with dilation of the distal esophagus. A TTS dilator was passed through the scope. Dilation with a 16-17-18 mm balloon dilator was performed to 18 mm. A fully inflated balloon was pulled through the esophagus with no resistance. The dilation site was examined and showed no change. Biopsies were obtained from the mid/proximal and distal esophagus with cold forceps for histology of suspected eosinophilic esophagitis. Findings: - Patchy mildly erythematous mucosa without bleeding was found in the gastric body. Biopsies were taken from the antrum, body, and fundus with a cold forceps for histology. Estimated blood loss was minimal. - The examined duodenum was normal. - The exam was otherwise without abnormality.  Colonoscopy 05/10/21: - The perianal and digital rectal examinations were normal. - One 15 mm submucosal nodule was found in the distal rectum. I was unable to remove the nodule with a snare. Tunneled biopsies were taken with a cold forceps for histology. Estimated blood loss was minimal. - A 3 mm polyp was found in the transverse colon. The polyp was sessile. The polyp was removed with a cold snare. Resection and retrieval were complete. Estimated blood loss  was minimal. - The exam was otherwise without abnormality on direct and retroflexion views.  Diagnosis 1. Surgical [P], gastric biopsy - ANTRAL AND OXYNTIC MUCOSA WITH MILD CHRONIC GASTRITIS. - NO HELICOBACTER PYLORI IDENTIFIED. 2. Surgical [P], distal esophagus - UNREMARKABLE SQUAMOUS MUCOSA. - NO EOSINOPHILIC ESOPHAGITIS. 3. Surgical [P], mid/proximal esop - UNREMARKABLE SQUAMOUS MUCOSA. - NO EOSINOPHILIC ESOPHAGITIS. 4. 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 5. Surgical [P], colon, rectum -  WELL-DIFFERENTIATED NEUROENDOCRINE TUMOR, G1 (CARCINOID). - SEE MICROSCOPIC DESCRIPTION. Microscopic Comment   07/2021 - [facility]  Lower EUS: ENDOSCOPIC FINDING: : One 7 mm subepithelial nodule was found in the mid  rectum. After EUS confirmed a mucosal lesion,  preparations were made for mucosal resection. EverLift  was injected to raise the lesion. Cap and snare  mucosal resection was performed. Resection and  retrieval were complete. Resected tissue margins were  examined and clear of polyp tissue. To prevent  bleeding after mucosal resection, two hemostatic clips  were successfully placed (MR conditional). There was  no bleeding during, or at the end, of the procedure. ENDOSONOGRAPHIC FINDING: : An oval intramural (subepithelial) lesion was found at  8 cm proximal to the anus within the rectum. The  lesion was hypoechoic. Sonographically, the origin  appeared to be within the deep mucosa (Layer 2). The  lesion measured up to 6 mm by 4 mm in diameter. The  endosonographic borders were smooth. No abnormal-appearing lymph nodes were seen in the  perirectal region and in the left iliac region. The anal canal was normal. Estimated Blood Loss: Estimated blood loss: none. Impression: FLEX SIGMOIDOSCOPY IMPRESSIONS: - Subepithelial nodule in the mid rectum. After EUS  confirmed mucosal lesion, mucosal resection was  performed. Resection and retrieval were complete.  Complete removal was accomplished. 2 clips (MR  conditional) were placed. EUS IMPRESSIONS: - An intramural (subepithelial) lesion was visualized  endosonographically at 8 cm proximal to the anus.  Sonographically, the origin appeared to be within the  deep mucosa (Layer 2). The diagnosis is benign  carcinoid (previously biopsied). - No abnormal-appearing lymph nodes were seen in the  perirectal region and in the left iliac region during  endosonographic examination. - Endosonographic images of the anal canal were   unremarkable. Recommendation: - Discharge patient to home. - Await path results. - Repeat lower endoscopic ultrasound in 1 year for  surveillance if negative margins.     DIAGNOSIS A. Rectal polyp, EMR:  Residual well-differentiated neuroendocrine tumor, Grade 1. Tumor present at deep margin. Peripheral margin is negative.      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   Vitamin B12 deficiency 12/2018   Vitamin D  deficiency 12/2018     Past Surgical History:  Procedure Laterality Date   COLONOSCOPY  05/10/2021   DILATATION & CURETTAGE/HYSTEROSCOPY WITH MYOSURE N/A 09/21/2019   Procedure: DILATATION & CURETTAGE/HYSTEROSCOPY WITH MYOSURE;  Surgeon: Nicholaus Burnard HERO, MD;  Location:  SURGERY CENTER;  Service: Gynecology;  Laterality: N/A;   DILITATION & CURRETTAGE/HYSTROSCOPY WITH HYDROTHERMAL ABLATION N/A 10/24/2021   Procedure: DILATATION & CURETTAGE/HYSTEROSCOPY WITH HYDROTHERMAL ABLATION;  Surgeon: Fredirick Glenys RAMAN,  MD;  Location: [facility];  Service: Gynecology;  Laterality: N/A;   FOOT SURGERY Left 2008   HAND SURGERY Left 11/2020   to remove skin disorder keratoderma   lower endo [facility]   07/12/2021   at [facility], benign neuroendocrine tumor  6 mm x 4 mm removed from rectum, repeat lower endo us  in 1 year   TUBAL LIGATION     UPPER GASTROINTESTINAL ENDOSCOPY  05/10/2021   Family History  Problem Relation Age of Onset   Arthritis Mother    Rashes / Skin problems Father    Diabetes Half-Sister    Diabetes Maternal Grandmother    Cancer Maternal Grandmother        type uknown   Cancer Maternal Grandfather        type uknown   Asthma Paternal  Grandmother    ADD / ADHD Son    GER disease Son    Colon cancer Neg Hx    Pancreatic cancer Neg Hx    Stomach cancer Neg Hx    Liver cancer Neg Hx    Esophageal cancer Neg Hx    Social History[1] Current Outpatient Medications  Medication Sig Dispense Refill   Melatonin 10 MG CAPS Take 20 mg by mouth at bedtime. 2 of 20 mg gummy at hs     metoCLOPramide  (REGLAN ) 10 MG tablet Take 1 tablet (10 mg total) by mouth every 6 (six) hours. 30 tablet 0   omeprazole  (PRILOSEC) 40 MG capsule TAKE 1 CAPSULE(40 MG) BY MOUTH IN THE MORNING AND AT BEDTIME 180 capsule 2   ondansetron  (ZOFRAN ) 4 MG tablet Take 1 tablet (4 mg total) by mouth every 6 (six) hours. 12 tablet 0   SUMAtriptan  (IMITREX ) 25 MG tablet Take 1 tablet (25 mg total) by mouth daily as needed for migraine. May repeat in 2 hours if headache persists or recurs. 10 tablet 3   topiramate  (TOPAMAX ) 25 MG tablet Take 1 tablet (25 mg total) by mouth 2 (two) times daily. 60 tablet 2   Vitamin D , Ergocalciferol , (DRISDOL ) 1.25 MG (50000 UNIT) CAPS capsule TAKE 1 CAPSULE BY MOUTH EVERY 7 DAYS 4 capsule 2   No current facility-administered medications for this visit.   Allergies[2]   Review of Systems: All systems reviewed and negative except where noted in HPI.   Lab Results  Component Value Date   WBC 4.8 11/05/2023   HGB 13.6 11/05/2023   HCT 40.0 11/05/2023   MCV 100.3 (H) 11/05/2023   PLT 316 11/05/2023     Physical Exam: BP 106/64 (BP Location: Left Arm, Patient Position: Sitting, Cuff Size: Normal)   Pulse 84   Ht 4' 11.75 (1.518 m) Comment: height measured without shoes  Wt 136 lb 8 oz (61.9 kg)   BMI 26.88 kg/m  Constitutional: Pleasant,well-developed, female in no acute distress. Neurological: Alert and oriented to person place and time. Psychiatric: Normal mood and affect. Behavior is normal.   ASSESSMENT: 50 y.o. female here for assessment of the following  1. Dysphagia, unspecified type   2.  Gastroesophageal reflux disease, unspecified whether esophagitis present   3. Globus sensation   4. Cough, unspecified type   5. Benign carcinoid tumor of rectum (HCC)    History of GERD, dysphagia, globus and associated cough.  Symptoms could be related to reflux, although discussed cough as many potential etiologies.  She is on high-dose omeprazole  which has not really helping, we discussed options.  Reglan  has also been added recently which has not  provided any benefit.  Given she has got dysphagia to both solids and liquids, and relatively recent EGD without any concerning pathology, concern for possible underlying motility disorder.  I think reasonable to start with a barium swallow with tablet to see if we could help localize her dysphagia.  If we isolate a clear stricture that would be amenable to dilation we can pursue EGD.  If dysmotility is thought to be the cause of this we may pursue esophageal manometry.  In the interim, gave her free sample of Voquenza 10 mg daily for 2 weeks supply to see if that may help her symptoms more so than the omeprazole .  If it helps her then we will try to get a prescription and continue that.  If no benefit and makes me really question how much reflux she is having.  If she fails to find benefit with PPI and symptoms persist, may consider alternative etiologies for her cough, and may need to consider 24-hour pH impedance testing and manometry.  Otherwise, she is on a rather high dose of Reglan  without any clear benefit, we will stop it.  Otherwise reviewed her history of rectal carcinoid.  She is overdue for surveillance EUS.  I will reach out to my advanced endoscopy colleagues to see if they can help with that at some point in the upcoming months.  She is agreeable to proceed with lower EUS.  Otherwise reviewed her history of B12 deficiency, she should be on B12 supplementation daily, does not appear she is taking it, she will check and make sure at home.   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 Mansouraty about possible lower EUS for surveillance of rectal carcinoid  Marcey Naval, MD [facility]  CC: [name], NP     [1]  Social History Tobacco Use   Smoking status: Every Day    Types: Cigarettes   Smokeless tobacco: Never   Tobacco comments:    Smoked 1 ppd for last 4 years,prior to that  1/2  ppd x 28 yrs- 03/19/23 not a whole pack a day5-7 left in pack  Vaping Use   Vaping status: Never Used  Substance Use Topics   Alcohol use: Yes    Comment: occ   Drug use: No  [2]  Allergies Allergen Reactions   Doxycycline Nausea Only and Rash   "

## 2024-02-05 NOTE — Patient Instructions (Signed)
 You have been scheduled for a Barium Esophogram at [facility] on 02/12/2024 at 1:00pm. Please arrive 30 minutes prior to your appointment for registration. If you need to reschedule for any reason, please contact radiology at [phone] to do so. __________________________________________________________________ A barium swallow is an examination that concentrates on views of the esophagus. This tends to be a double contrast exam (barium and two liquids which, when combined, create a gas to distend the wall of the oesophagus) or single contrast (non-ionic iodine  based). The study is usually tailored to your symptoms so a good history is essential. Attention is paid during the study to the form, structure and configuration of the esophagus, looking for functional disorders (such as aspiration, dysphagia, achalasia, motility and reflux) EXAMINATION You may be asked to change into a gown, depending on the type of swallow being performed. A radiologist and radiographer will perform the procedure. The radiologist will advise you of the type of contrast selected for your procedure and direct you during the exam. You will be asked to stand, sit or lie in several different positions and to hold a small amount of fluid in your mouth before being asked to swallow while the imaging is performed .In some instances you may be asked to swallow barium coated marshmallows to assess the motility of a solid food bolus. The exam can be recorded as a digital or video fluoroscopy procedure. POST PROCEDURE It will take 1-2 days for the barium to pass through your system. To facilitate this, it is important, unless otherwise directed, to increase your fluids for the next 24-48hrs and to resume your normal diet.  This test typically takes about 30 minutes to perform. __________________________________________________________________________________  Stop Reglan   Hold Omeprazole   Try Voquezna - once a day   Take  a B12 supplement daily

## 2024-02-08 ENCOUNTER — Ambulatory Visit: Payer: Worker's Compensation

## 2024-02-09 ENCOUNTER — Telehealth: Payer: Self-pay

## 2024-02-09 ENCOUNTER — Telehealth: Payer: Self-pay | Admitting: Gastroenterology

## 2024-02-09 ENCOUNTER — Other Ambulatory Visit: Payer: Self-pay

## 2024-02-09 DIAGNOSIS — D3A026 Benign carcinoid tumor of the rectum: Secondary | ICD-10-CM

## 2024-02-09 NOTE — Telephone Encounter (Signed)
Lower EUS scheduled, pt instructed and medications reviewed.  Patient instructions mailed to home.  Patient to call with any questions or concerns.

## 2024-02-09 NOTE — Telephone Encounter (Signed)
 Inbound call from patient stating that she got a reminder about her scheduled appointment for Frebruary the 9 th. Patient is wanting to know wether is needing to still go to her radiology appointment. Patient is requesting a call back. Please advise.

## 2024-02-09 NOTE — Telephone Encounter (Signed)
 Advised the patient to go forwarded with the planned imaging.

## 2024-02-09 NOTE — Telephone Encounter (Signed)
 Lower EUS has been entered for 02/22/24 at 8 am at [facility] with GM

## 2024-02-09 NOTE — Telephone Encounter (Signed)
-----   Message from Aloha Finner, MD sent at 02/08/2024 11:01 PM EST ----- Regarding: RE: lower EUS - rectal carcinoid SA, Happy to help.  Kennedy Bohanon, Let's set up a followup Lower EUS in March/April. Thanks. GM ----- Message ----- From: Leigh Elspeth SQUIBB, MD Sent: 02/05/2024   4:35 PM EST To: Aloha Finner Raddle., MD Subject: lower EUS - rectal carcinoid                   Vanessa Butler,  This is a former [employer] patient who had a rectal carcinoid a few years ago, status post resection at [facility].  Looks like she is overdue for surveillance lower EUS.  Would you be able to help with this in the next few months?  Thanks  SA

## 2024-02-10 ENCOUNTER — Ambulatory Visit: Payer: Worker's Compensation

## 2024-02-10 DIAGNOSIS — R498 Other voice and resonance disorders: Secondary | ICD-10-CM

## 2024-02-10 DIAGNOSIS — R2681 Unsteadiness on feet: Secondary | ICD-10-CM | POA: Diagnosis not present

## 2024-02-10 DIAGNOSIS — R41841 Cognitive communication deficit: Secondary | ICD-10-CM

## 2024-02-10 NOTE — Therapy (Signed)
 " OUTPATIENT SPEECH LANGUAGE PATHOLOGY TREATMENT   Patient Name: Vanessa Butler MRN: [id number] DOB:[birth year], 49 y.o., female Today's Date: 02/10/2024  PCP: [name], NP REFERRING PROVIDER: Joane Artist RAMAN, MD  END OF SESSION:  End of Session - 02/10/24 1601     Visit Number 6    Number of Visits 13    Date for Recertification  02/17/24    SLP Start Time 1525    SLP Stop Time  1601    SLP Time Calculation (min) 36 min    Activity Tolerance Patient tolerated treatment well             Past Medical History:  Diagnosis Date   Abnormal uterine bleeding (AUB)    Benign neuroendocrine tumor of rectum (HCC)    Concussion without loss of consciousness    GERD (gastroesophageal reflux disease)    Grieving    son murdered 4yrs ago per pt on 10-14-2021   Insomnia 11/2018   Skin disorder    seen at [facility] for keratoderma   Vitamin B12 deficiency 12/2018   Vitamin D  deficiency 12/2018   Past Surgical History:  Procedure Laterality Date   COLONOSCOPY  05/10/2021   DILATATION & CURETTAGE/HYSTEROSCOPY WITH MYOSURE N/A 09/21/2019   Procedure: DILATATION & CURETTAGE/HYSTEROSCOPY WITH MYOSURE;  Surgeon: Nicholaus Burnard HERO, MD;  Location: [facility];  Service: Gynecology;  Laterality: N/A;   DILITATION & CURRETTAGE/HYSTROSCOPY WITH HYDROTHERMAL ABLATION N/A 10/24/2021   Procedure: DILATATION & CURETTAGE/HYSTEROSCOPY WITH HYDROTHERMAL ABLATION;  Surgeon: Fredirick Glenys RAMAN, MD;  Location: [facility];  Service: Gynecology;  Laterality: N/A;   FOOT SURGERY Left 2008   HAND SURGERY Left 11/2020   to remove skin disorder keratoderma   lower endo [facility]   07/12/2021   at [facility], benign neuroendocrine tumor  6 mm x 4 mm removed from rectum, repeat lower endo [facility]  in 1 year   TUBAL LIGATION     UPPER GASTROINTESTINAL ENDOSCOPY  05/10/2021   Patient Active Problem List   Diagnosis Date Noted   Carpal tunnel syndrome 01/19/2024   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   Abnormal uterine bleeding (AUB) 08/22/2021   Insomnia 12/13/2018   Hyperglycemia 12/13/2018   Gastroesophageal reflux disease without esophagitis 05/20/2018   Tobacco dependence 02/27/2016   Neuropathy 02/27/2016    ONSET DATE: 12/15/2023 (Referral date)   REFERRING DIAG:  S06.0X0A (ICD-10-CM) - Concussion without loss of consciousness, initial encounter    THERAPY DIAG:  Other voice and resonance disorders  Cognitive communication deficit  Rationale for Evaluation and Treatment: Rehabilitation  SUBJECTIVE:   SUBJECTIVE STATEMENT: When I am told something Pt accompanied by: self  PERTINENT HISTORY: GERD, vit B12 deficiency, vit D deficiency. Pt notes that when she has a conversation, she forgets what she is talking about. She states that she forgets what people say around 5 minutes after they have told her something. 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 When items are missing, pt has to read orders to find which item needs to be found. Pt states that she feels okay about household responsibilities; however, pt as moments where she begins a task, but forgets what she is doing shortly later. Pt experiences communication breakdown with people, especially her husband. She notes that she will often have to ask for multiple repetitions. Pt feels confused during conversations. Pt also experiences difficulties with phone calls with her husband due to  difficulties with thought organization.   PAIN:  Are you having pain? No  FALLS: Has patient fallen in last 6 months?  See PT evaluation for details  LIVING ENVIRONMENT: Lives with: lives with their family, lives with their spouse, and lives with their son Lives in: House/apartment  PLOF:  Level of assistance: Independent with ADLs Employment: Full-time employment  PATIENT GOALS: To get back to work  and improve communication.   OBJECTIVE:  Note: Objective measures were completed at Evaluation unless otherwise noted.  DIAGNOSTIC FINDINGS: EXAM: CT HEAD WITHOUT CONTRAST 11/05/2023 04:00:55 PM   TECHNIQUE: CT of the head was performed without the administration of intravenous contrast. Automated exposure control, iterative reconstruction, and/or weight based adjustment of the mA/kV was utilized to reduce the radiation dose to as low as reasonably achievable.   COMPARISON: None available.   CLINICAL HISTORY: Headache, increasing frequency or severity.   FINDINGS:   BRAIN AND VENTRICLES: No acute hemorrhage. No evidence of acute infarct. No hydrocephalus. No extra-axial collection. No mass effect or midline shift.   ORBITS: No acute abnormality.   SINUSES: No acute abnormality.   SOFT TISSUES AND SKULL: No acute soft tissue abnormality. No skull fracture.   IMPRESSION: 1. No acute intracranial abnormality.  COGNITION: Overall cognitive status: Impaired: Areas of impairment:  Memory: Impaired: Working It Consultant function: Impaired: Slow processing   Functional deficits: Following along in conversations, difficulties with remembering names and important information from conversations.  COGNITIVE COMMUNICATION: Following directions: Follows multi-step commands with increased time  Auditory comprehension: Impaired: Pt requires extra time and repetitions for complex auditory comprehension. Verbal expression: WFL Functional communication: WFL   STANDARDIZED ASSESSMENTS: SLUMS: 26/30  PATIENT REPORTED OUTCOME MEASURES (PROM): Mental Fatigue Scale: 13.5 - She rated a 2 fairly serious problem requiring a full nights sleep after working to her limit, having difficulty concentrating and forming thoughts, being more tearful than usual, an sleeping less than usual. She rated a 1 or a slight problem with reduced cognition in morning and night and better in  afternoon and evening, and forgetting things more often than she used to.                                                                                                                             TREATMENT DATE:  02/10/24: Pt continues to be successful at work regarding her cognition. Pt notes that things at home have been a little better with regard to distractions. She continues to forget things during conversations. Pt feels that she loses her train of thought. SLP instructed pt in integrating external memory supports (notepad) during conversation to support short-term memory recall. SLP also instructed pt in taking an intentional pause during short-term memory lapses in conversation to promote communication repair and reduce frustration. Pt's cough has improved a little; pt is reducing her throat clearing at home. Pt continues to experience a globus sensation in her chest region. Pt utilized cough suppression strategies to  suppress one cough successfully during session given rare min A. Plan is to continue cognitive-communication strategies for optimizing pt's short-term memory during conversations.   02/03/24: She continues to report success at work - she is not making mistakes at work due to cognition - she continues to report with divided attention and some mistakes outside of work due to getting distractions. Education re: attention vs memory - generated strategies to support attention - including self advocating, educating her family - and limiting interruptions when she is working on a task. Vanessa Butler has implemented cough suppression strategies of abdominal sniff and sh exhale, taking sips and doing hard swallows. She reports coughing has been frequent, but she is able to control cough with breathing. Today, noted multiple throat clearing - she reports globus in throat and sternum. She required frequent mod verbal cues to be aware of throat clearing behavior- trained in throat clear  alternatives including sips,hard swallows and rescue breathing. She identifies scents as cough trigger as well. She also had coughing episode during emotional conversation today, indicating possible stress trigger as well. Vanessa Butler demonstrated cough suppression 3x today with usual mod modeling, instruction and verbal cues to not talk until sensation has passed.  Goals added  February 23, 2024: Pt has returned to work. Pt is readjusting to her job; however, she feels that she is adjusting well with no significant difficulties noted. She states that she was placed in a work station that has her computer on the left side, where she is used to it on the right side. SLP educated pt in advocating for a right side computer to support job readjustment for her cognition. She notes that she has spoken with her higher-ups and is waiting for a spot to become available. Pt notes that she has had a persistent cough and a feeling of a lump in her throat. SLP had pt complete Reflux Symptom Index (RSI) to analyze pt's current symptoms. Pt scored 39/45 on RSI. SLP instructed pt in cough suppression strategies (Rescue Breathing - Blow out on a /shhhhh/, followed by deep breath) to minimize persistent coughing for QOL. Pt successfully demonstrated the strategy during 2 coughing episodes. Pt is agreeable to using strategy at home to reduce incidence of coughing. Plan is to continue cognitive-communication support building and review cough strategies to maximize pt QOL and safety during ADLs.  01/25/24: Pt plans to return to work on Wednesday. SLP collaborated with pt to determine work requirements. SLP utilize role-playing as a strategy for pt to interact with functional task related to work responsibilities. Pt displayed 100% success with performing functional task (comprehending, organizing, and identifying errors related to how many items in a order) independently. SLP introduced external memory strategies (using sticky notes) to  improve short-term memory at work. SLP recommended pt to use sticky notes to keep tally of the amount of items in an order to reduce likelihood of mistake or errors. SLP reviewed energy conservation strategies (resting after getting home from work for the first two weeks) to optimize pt's cognitive health. Pt is making progress with cognitive-communication goals. Plan is to discuss current energy requirements at work due to pt returning to work this week.   01/13/24: Targeted training compensatory strategies for processing in conversations - including limiting back ground noises and distractions, repeating back what you have heard, talking on the phone seated in a quiet place. See Patient Instructions. Targeted awareness and compensations for cognitive impairments for return to work including using a chair, having a co-worker buddy to alert  her to any mistakes or balance concerns, taking breaks in quiet place with closed eyes and not planning long or harder tasks after work.   01/06/24: Evaluation completed. No tx completed this date.    PATIENT EDUCATION: Education details: Evaluation results.  Person educated: Patient Education method: Explanation Education comprehension: verbalized understanding and needs further education   GOALS: Goals reviewed with patient? Yes  SHORT TERM GOALS: Target date: 01/25/24  Pt will describe and utilize at least 2 internal/external memory strategies to optimize safety and efficiency during structured, functional tasks related to work given occasional min A. Baseline: Goal status: MET  2.  Pt and pt's family will successfully utilize repair strategies during moments of communication breakdown to maximize pt's communicative effectiveness in 80% of opportunities given occasional min A. Baseline:  Goal status: MET  3.  Pt will demonstrate successful thought organization for communication as evident by <3 pauses during moderately complex conversation given  occasional min A. Baseline:  Goal status: MET  4.  Pt will develop and perform appropriate sequencing tasks related to work procedures (using external memory strategies) with 100% consistency given occasional min A. Baseline:  Goal status: ONGOING  5.   Pt will complete PROM for post-concussion symptoms.  Baseline:  Goal status: MET  6.  TBD. Baseline:  Goal status: ONGOING  LONG TERM GOALS: Target date: 02/17/24  Pt will utilize memory strategies independently for optimizing communication effectiveness and returning to work. Baseline:  Goal status: ONGOING  2.  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  Baseline:  Goal status: ONGOING  3.  Pt and pt's family will utilize communication support strategies independently to maximize pt's complex auditory comprehension and thought organization during moderately complex conversation. Baseline:  Goal status: ONGOING  4.  Pt will demonstrate anticipatory awareness of potential problems she may encounter at work due to cognitive changes Baseline:  Goal status: INITIAL  5.  Pt will utilize cough suppression strategies as needed with occasional min A over 2 sessions Baseline:  Goal status: INITIAL  6.  Pt will avoid throat clearing behavior to have 2 or less throat clears a session with occasional min A Baseline:  Goal status: INITIAL  ASSESSMENT:  CLINICAL IMPRESSION: Patient is a 49 y.o. female who was seen today for cognitive evaluation s/p concussion. Pt presents with mild-moderate cognitive-communication deficits primarily characterized by short-term memory impairment and executive dysfunction due to slow processing. Pt exhibited many moments where she required multiple repetitions and extra time for processing to understand assessment tasks. According to pt's recent hx, she experiences difficulties with her cognition during conversations with family  and performing daily tasks requiring short-term memory skills. ST is recommended to optimizing pt's cognitive-communication skills for returning to work and effectively communicating during ADLs. Without ST, pt is at risk of low job performance and lower QOL due to social isolation as a result of cognitive-communication deficits.  OBJECTIVE IMPAIRMENTS: include memory and executive functioning. These impairments are limiting patient from return to work, ADLs/IADLs, and effectively communicating at home and in community. Factors affecting potential to achieve goals and functional outcome are N/A. Patient will benefit from skilled SLP services to address above impairments and improve overall function.  REHAB POTENTIAL: Excellent  PLAN:  SLP FREQUENCY: 2x/week  SLP DURATION: 6 weeks  PLANNED INTERVENTIONS: Environmental controls, Cognitive reorganization, Internal/external aids, Functional tasks, SLP instruction and feedback, Compensatory strategies, Patient/family education, and [id number] Treatment of speech (30 or 45 min)  Waddell Music, M.A., CF-SLP 02/10/2024, 4:01 PM      "

## 2024-02-10 NOTE — Patient Instructions (Signed)
 Memory strategies for conversations:  Use a notepad or writing tool for keeping track of what you are talking about in a conversation.  If you forget what you are talking about,  Stop Take a deep breath Then, start trying to think of any details from the discussion. If you can think of any details, write them down on a notepad. If you can't think of any details, ask your communication partner.

## 2024-02-11 ENCOUNTER — Encounter (HOSPITAL_COMMUNITY): Payer: Self-pay | Admitting: Gastroenterology

## 2024-02-12 ENCOUNTER — Ambulatory Visit (HOSPITAL_COMMUNITY)
Admission: RE | Admit: 2024-02-12 | Discharge: 2024-02-12 | Disposition: A | Payer: MEDICAID | Source: Ambulatory Visit | Attending: Gastroenterology

## 2024-02-12 ENCOUNTER — Ambulatory Visit: Payer: MEDICAID | Admitting: Physical Therapy

## 2024-02-12 ENCOUNTER — Encounter: Payer: Self-pay | Admitting: Physical Therapy

## 2024-02-12 VITALS — BP 113/72 | HR 78

## 2024-02-12 DIAGNOSIS — R059 Cough, unspecified: Secondary | ICD-10-CM | POA: Diagnosis present

## 2024-02-12 DIAGNOSIS — R1310 Dysphagia, unspecified: Secondary | ICD-10-CM | POA: Diagnosis present

## 2024-02-12 DIAGNOSIS — R2681 Unsteadiness on feet: Secondary | ICD-10-CM

## 2024-02-12 DIAGNOSIS — D3A026 Benign carcinoid tumor of the rectum: Secondary | ICD-10-CM | POA: Diagnosis present

## 2024-02-12 DIAGNOSIS — R09A2 Foreign body sensation, throat: Secondary | ICD-10-CM | POA: Diagnosis present

## 2024-02-12 DIAGNOSIS — K219 Gastro-esophageal reflux disease without esophagitis: Secondary | ICD-10-CM | POA: Insufficient documentation

## 2024-02-12 DIAGNOSIS — R42 Dizziness and giddiness: Secondary | ICD-10-CM

## 2024-02-12 NOTE — Therapy (Signed)
 " OUTPATIENT PHYSICAL THERAPY VESTIBULAR TREATMENT    Patient Name: Vanessa Butler MRN: [id number] DOB:17-Jun-1975, 49 y.o., female Today's Date: 02/12/2024  END OF SESSION:  PT End of Session - 02/12/24 1448     Visit Number 10    Number of Visits 12    Date for Recertification  02/27/24   per re-cert on 8/85/73   Authorization Type Worker's Comp    Authorization - Visit Number 10    Authorization - Number of Visits 12    PT Start Time 1447    PT Stop Time 1528    PT Time Calculation (min) 41 min    Equipment Utilized During Treatment Gait belt    Activity Tolerance Patient tolerated treatment well    Behavior During Therapy WFL for tasks assessed/performed           Past Medical History:  Diagnosis Date   Abnormal uterine bleeding (AUB)    Benign neuroendocrine tumor of rectum (HCC)    Concussion without loss of consciousness    GERD (gastroesophageal reflux disease)    Grieving    son murdered 74yrs ago per pt on 10-14-2021   Insomnia 11/2018   Skin disorder    seen at [facility] for keratoderma   Vitamin B12 deficiency 12/2018   Vitamin D  deficiency 12/2018   Past Surgical History:  Procedure Laterality Date   COLONOSCOPY  05/10/2021   DILATATION & CURETTAGE/HYSTEROSCOPY WITH MYOSURE N/A 09/21/2019   Procedure: DILATATION & CURETTAGE/HYSTEROSCOPY WITH MYOSURE;  Surgeon: Nicholaus Burnard HERO, MD;  Location: [facility];  Service: Gynecology;  Laterality: N/A;   DILITATION & CURRETTAGE/HYSTROSCOPY WITH HYDROTHERMAL ABLATION N/A 10/24/2021   Procedure: DILATATION & CURETTAGE/HYSTEROSCOPY WITH HYDROTHERMAL ABLATION;  Surgeon: Fredirick Glenys RAMAN, MD;  Location: [facility];  Service: Gynecology;  Laterality: N/A;   FOOT SURGERY Left 2008   HAND SURGERY Left 11/2020   to remove skin disorder keratoderma   lower endo [facility]   07/12/2021   at [facility], benign neuroendocrine tumor  6 mm x 4 mm removed from rectum, repeat lower endo [facility]  in 1 year   TUBAL  LIGATION     UPPER GASTROINTESTINAL ENDOSCOPY  05/10/2021   Patient Active Problem List   Diagnosis Date Noted   Carpal tunnel syndrome 01/19/2024   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   Abnormal uterine bleeding (AUB) 08/22/2021   Insomnia 12/13/2018   Hyperglycemia 12/13/2018   Gastroesophageal reflux disease without esophagitis 05/20/2018   Tobacco dependence 02/27/2016   Neuropathy 02/27/2016    PCP: Bascom Borer, NP REFERRING PROVIDER: Artist Lloyd, MD  REFERRING DIAG:  G43.809 (ICD-10-CM) - Other migraine without status migrainosus, not intractable  S06.0X0A (ICD-10-CM) - Concussion without loss of consciousness, initial encounter    THERAPY DIAG:  Unsteadiness on feet  Dizziness and giddiness  ONSET DATE: 09/23/23   Rationale for Evaluation and Treatment: Rehabilitation  SUBJECTIVE:   SUBJECTIVE STATEMENT: Everything is getting a little better. Now only having mild dizziness with bending. Still gets a little dizzy every so often, but it is not as bad. Just little short episodes. Work has been going well. Has not noticed anything in particular that is challenging. Wants to work on some balance today. Just came from barium swallow.   Pt accompanied by: self  PERTINENT HISTORY: GERD, vit B12 deficiency, vit D deficiency   PAIN:  Are you having pain? None  Vitals:   02/12/24 1453  BP: 113/72  Pulse: 78     PRECAUTIONS: Fall  WEIGHT  BEARING RESTRICTIONS: No  FALLS: Has patient fallen in last 6 months? No  LIVING ENVIRONMENT: Lives with: lives with their family Lives in: House/apartment Stairs: Yes: Internal: flight steps; on right going up and External: 5-6 steps; on right going up Has following equipment at home: None  PLOF: Independent driving, working full time in warehouse  PATIENT GOALS: to get back to my normal self  OBJECTIVE:  Note: Objective measures were completed at Evaluation unless otherwise noted.  DIAGNOSTIC FINDINGS: 11/05/23 CT  Head  IMPRESSION: 1. No acute intracranial abnormality.  COGNITION: Overall cognitive status: Within functional limits for tasks assessed   SENSATION: WFL, but reports CTS in R UE  POSTURE:  rounded shoulders, forward head, increased thoracic kyphosis, posterior pelvic tilt, and flexed trunk   Cervical ROM:   WFL, painfree but slightly sore with R lateral rotation  STRENGTH: WFL   BED MOBILITY:  Intermittent dizziness if supine > sit too quickly, otherwise independent    GAIT: Gait pattern: WFL  PATIENT SURVEYS:  -Post Concussion Symptom Scale (PCSS)  -60/126 (15% normal) - Convergence Insufficiency Symptom Survey   -19/56  VESTIBULAR ASSESSMENT:  GENERAL OBSERVATION: NAD, no AD   SYMPTOM BEHAVIOR:  Subjective history: see above  Non-Vestibular symptoms: changes in vision, neck pain, headaches, tinnitus, nausea/vomiting, and migraine symptoms  Type of dizziness: Blurred Vision, Imbalance (Disequilibrium), Spinning/Vertigo, and weird  Frequency: multiple times/day   Duration: minutes  Aggravating factors: Spontaneous, Induced by position change: supine to sit, Induced by motion: bending down to the ground, turning body quickly, turning head quickly, and driving, Worse in the morning, Worse outside or in busy environment, Occurs when standing still , and Moving eyes  Relieving factors: medication, rest, slow movements, and avoid busy/distracting environments  Progression of symptoms: worse  OCULOMOTOR EXAM:  Ocular Alignment: normal  Ocular ROM: No Limitations  Spontaneous Nystagmus: absent  Gaze-Induced Nystagmus: absent  Smooth Pursuits: intact  Saccades: intact  Convergence/Divergence: to be assessed                                                                                                                            TREATMENT:  Therapeutic Activity/NMR:  Vitals:   02/12/24 1453  BP: 113/72  Pulse: 78    Assessed vitals in RUE and WFL  DHI:  22/100 = mild handicap in regards to dizziness   On air ex: Alternating forward stepping holding 4# medicine ball and performing trunk rotation to toss ball to floor and catch 3 x 10 reps, last 2 sets performed more of a lunge for incr bending with pt reporting mild dizziness, was better after the 3rd set. Needing a standing rest break between each   EC: 10 reps head turns, 10 reps head nods, 10 reps diagonals in each direction, pt more challenged with these and needing intermittent taps for balance EC mini squats 10 reps while holding bending position for a few seconds Grabbing Squigz from therapist and placing it on  ground while bending quickly and standing back up, performed 2 sets of 10 reps, pt reporting no issues when going quickly  On blue foam beam: Tandem gait down and back x1 rep With head turns down and back 2 reps  With head nods up and down 2 reps  Pt challenged by addition of head movements, needing frequent taps to bar for balance  Holding ball and bending over in a diagonal to tap tip of cone and bringing ball up in diagonal in superior/lateral direction, performed 2 sets of 10 reps each direction, intermittent CGA/min A for balance. Pt reporting mild dizziness     PATIENT EDUCATION: Education details: Continue HEP, can add in balance with EC and head movements in corner  Person educated: Patient Education method: Explanation, Demonstration, and Verbal cues Education comprehension: verbalized understanding and needs further education  HOME EXERCISE PROGRAM: -Lucious string 3x/day 3 sets of 30s  Standing horizontal VOR x1 30 seconds  with busy background  -Standing habitation to bending   R Wilhelmena Carrel   Access Code: [email or web address] URL: [email or web address] Date: 01/06/2024 Prepared by: Sheffield Senate  Exercises - Romberg Stance Eyes Closed on Foam Pad  - 1-2 x daily - 5 x weekly - 3 sets - 30 hold - Standing Balance with Eyes Closed on Foam  - 1-2 x  daily - 5 x weekly - 2 sets - 10 reps  GOALS: Goals reviewed with patient? Yes  SHORT TERM GOALS: =LTG based on PT POC length    UPDATED/ONGOING LTGS FOR RE-CERT:  LONG TERM GOALS: Target date: 02/01/2024  Pt will be independent with final HEP for improved symptom report and convergence   Baseline: will benefit from updated/additions  Goal status: ON-GOING  2.  Pt will improve DVA to a 3 line difference or less in order to demo improved VOR.  Baseline:5 line difference  3 line difference on 1/14, but pt having less of a line in static vision  Goal status: PARTIALLY MET   3.  Patient will improve score to </= 16 on the CISS to demonstrate an improvement in her convergence symptoms Baseline: 19/56   27/56 (12/22)  11/56 (1/14) Goal status: MET  4. MSQ goal   Baseline: 0/5 on 12/24, goal not needed   Goal status: N/A  5. Pt will demo negative R posterior canalithiasis in order to resolve dizziness with bending over and bed mobility   Baseline: pt cleared at end of session on 12/22. Will need to re-assess and make sure pt stays cleared   Negative on 01/20/24  Goal status: MET   6. Pt will improve condition 4 of mCTSIB to at least 30 seconds in order to demo improved vestibular input for balance.   Baseline:2o seconds  30 seconds (1/14)  Goal status: MET  UPDATED/ON-GOING LTGS FOR RE-CERT:  LONG TERM GOALS: Target date: 02/25/2024  Pt will be independent with final HEP for improved symptom report and convergence   Baseline: will benefit from updated/additions  Goal status: ON-GOING  2.  Pt will report mild to no dizziness with prolonged bending tasks in order to demo improved tolerance for work tasks.   Baseline: severe dizziness/unsteadiness with prolonged bending Goal status: NEW  3. Pt will decr DHI to a 16/100 or less in order to demo decr disability in regards to dizziness.  Baseline: 22/100 Goal status: NEW  ASSESSMENT:  CLINICAL IMPRESSION: Pt filled out  the [facility] and scored a 22/100, indicating mild handicap in regards to dizziness. LTG updated  as appropriate. Pt reporting improvements in bending since she was last here and now just reporting mild dizziness instead of severe. Today's skilled session worked on balance tasks with incr vestibular input and habituation balance tasks. Pt tolerated session well, was challenged by tandem gait with head movements and did report mild dizziness with bending that subsided quickly. Will continue per POC.    OBJECTIVE IMPAIRMENTS: decreased activity tolerance, decreased knowledge of condition, dizziness, impaired vision/preception, and pain.   ACTIVITY LIMITATIONS: carrying, lifting, bending, standing, hygiene/grooming, locomotion level, and caring for others  PARTICIPATION LIMITATIONS: meal prep, cleaning, interpersonal relationship, driving, shopping, community activity, occupation, and yard work  PERSONAL FACTORS: Age, Fitness, Past/current experiences, Profession, Sex, Social background, and Time since onset of injury/illness/exacerbation are also affecting patient's functional outcome.   REHAB POTENTIAL: Good  CLINICAL DECISION MAKING: Stable/uncomplicated  EVALUATION COMPLEXITY: Low   PLAN:  PT FREQUENCY: 2x/week  PT DURATION: 4 weeks, plus an additional 1x week for 4 weeks for re-cert   PLANNED INTERVENTIONS: [id number]- PT Re-evaluation, 97750- Physical Performance Testing, 97110-Therapeutic exercises, 97530- Therapeutic activity, 97112- Neuromuscular re-education, 97535- Self Care, [id number]- Manual therapy, [phone]- Gait training, [phone]- Canalith repositioning, [phone]- Aquatic Therapy, Patient/Family education, Balance training, Stair training, Vestibular training, Visual/preceptual remediation/compensation, Cognitive remediation, and DME instructions  PLAN FOR NEXT SESSION: Cont balance/vestibular exercises  - trampoline, rockerboard, Bosu; higher level VOR tasks with busy backgrounds, work on habituation  to bending!!    Sheffield Senate, PT, DPT 02/12/24 3:49 PM     "

## 2024-02-15 ENCOUNTER — Ambulatory Visit: Payer: MEDICAID | Admitting: Physical Therapy

## 2024-02-15 ENCOUNTER — Ambulatory Visit: Payer: Worker's Compensation

## 2024-02-16 ENCOUNTER — Ambulatory Visit: Payer: Self-pay | Admitting: Gastroenterology

## 2024-02-16 ENCOUNTER — Telehealth: Payer: Self-pay | Admitting: Gastroenterology

## 2024-02-16 NOTE — Telephone Encounter (Signed)
 Thanks Bayley. It looks like her barium study just resulted today, so I had not seen it yet. 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 I agree with Bayley -next step is esophageal manometry and 24-hour pH impedance testing OFF PPI.  Can you please help coordinate for her if she is willing.  I am not sure when her next opening is.  I do think she should be on PPI with the symptoms, if Voquenza did not help, she could try Protonix  40 mg twice daily -I cannot recall if she has tried that yet.  She can also try using some Maalox or Gaviscon as needed for breakthrough.

## 2024-02-16 NOTE — Telephone Encounter (Signed)
[facility]bruster pt. 

## 2024-02-16 NOTE — Telephone Encounter (Signed)
 Patient calling for the results of her barium swallow, states she never heard back from those results. She also states the medication she was given has only helped with the excessive coughing. States she is still having issues with reflux, feeling there is a lump in her throat and chest, acid reflux and hardly eating. Patient was given samples of voquezna to take to see if that would help. Her reglan  was stopped and omeprazole  held. Please advise.

## 2024-02-17 ENCOUNTER — Ambulatory Visit: Payer: Worker's Compensation

## 2024-02-17 ENCOUNTER — Other Ambulatory Visit: Payer: Self-pay

## 2024-02-17 DIAGNOSIS — R41841 Cognitive communication deficit: Secondary | ICD-10-CM

## 2024-02-17 MED ORDER — PANTOPRAZOLE SODIUM 40 MG PO TBEC: 40.0000 mg | DELAYED_RELEASE_TABLET | Freq: Two times a day (BID) | ORAL | 6 refills | Status: AC

## 2024-02-17 NOTE — Telephone Encounter (Signed)
 Spoke with pt and she is aware of results and recommendations. Protonix  script sent to pharmacy. Pt sent information via mychart regarding EM with 24 hour ph impedance test. Will let her know appt date and time once scheduled.

## 2024-02-17 NOTE — Therapy (Signed)
 " OUTPATIENT SPEECH LANGUAGE PATHOLOGY TREATMENT/DISCHARGE SUMMARY   Patient Name: Vanessa Butler MRN: [id number] DOB:Jun 02, 1975, 49 y.o., female Today's Date: 02/17/2024  PCP: [name], NP REFERRING PROVIDER: Joane Artist RAMAN, MD  END OF SESSION:  End of Session - 02/17/24 1612     Visit Number 7    Number of Visits 13    Date for Recertification  02/17/24    SLP Start Time 1535    SLP Stop Time  1600    SLP Time Calculation (min) 25 min    Activity Tolerance Patient tolerated treatment well              Past Medical History:  Diagnosis Date   Abnormal uterine bleeding (AUB)    Benign neuroendocrine tumor of rectum (HCC)    Concussion without loss of consciousness    GERD (gastroesophageal reflux disease)    Grieving    son murdered 11yrs ago per pt on 10-14-2021   Insomnia 11/2018   Skin disorder    seen at [facility] for keratoderma   Vitamin B12 deficiency 12/2018   Vitamin D  deficiency 12/2018   Past Surgical History:  Procedure Laterality Date   COLONOSCOPY  05/10/2021   DILATATION & CURETTAGE/HYSTEROSCOPY WITH MYOSURE N/A 09/21/2019   Procedure: DILATATION & CURETTAGE/HYSTEROSCOPY WITH MYOSURE;  Surgeon: Nicholaus Burnard HERO, MD;  Location: [facility];  Service: Gynecology;  Laterality: N/A;   DILITATION & CURRETTAGE/HYSTROSCOPY WITH HYDROTHERMAL ABLATION N/A 10/24/2021   Procedure: DILATATION & CURETTAGE/HYSTEROSCOPY WITH HYDROTHERMAL ABLATION;  Surgeon: Fredirick Glenys RAMAN, MD;  Location: [facility] ;  Service: Gynecology;  Laterality: N/A;   FOOT SURGERY Left 2008   HAND SURGERY Left 11/2020   to remove skin disorder keratoderma   lower endo [facility]   07/12/2021   at [facility], benign neuroendocrine tumor  6 mm x 4 mm removed from rectum, repeat lower endo [facility]  in 1 year   TUBAL LIGATION     UPPER GASTROINTESTINAL ENDOSCOPY  05/10/2021   Patient Active Problem List   Diagnosis Date Noted   Carpal tunnel syndrome 01/19/2024    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 Pt accompanied by: self  PERTINENT HISTORY: GERD, vit B12 deficiency, vit D deficiency. Pt notes that when she has a conversation, she forgets what she is talking about. She states that she forgets what people say around 5 minutes after they have told her something. 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 When items are missing, pt has to read orders to find which item needs to be found. Pt states that she feels okay about household responsibilities; however, pt as moments where she begins a task, but forgets what she is doing shortly later. Pt experiences communication breakdown with people, especially her husband. She notes that she will often have to ask for multiple repetitions. Pt feels confused during conversations. Pt also experiences difficulties with phone calls with her husband due to difficulties with thought organization.  PAIN:  Are you having pain? No  FALLS: Has patient fallen in last 6 months?  See PT evaluation for details  LIVING ENVIRONMENT: Lives with: lives with their family, lives with their spouse, and lives with their son Lives in: House/apartment  PLOF:  Level of assistance: Independent with ADLs Employment: Full-time employment  PATIENT GOALS: To get back to work and improve  communication.   OBJECTIVE:  Note: Objective measures were completed at Evaluation unless otherwise noted.  DIAGNOSTIC FINDINGS: EXAM: CT HEAD WITHOUT CONTRAST 11/05/2023 04:00:55 PM   TECHNIQUE: CT of the head was performed without the administration of intravenous contrast. Automated exposure control, iterative reconstruction, and/or weight based adjustment of the mA/kV was utilized to reduce the radiation dose to as low as reasonably achievable.   COMPARISON: None available.   CLINICAL HISTORY: Headache, increasing frequency or severity.   FINDINGS:   BRAIN AND VENTRICLES: No acute hemorrhage. No evidence of acute infarct. No hydrocephalus. No extra-axial collection. No mass effect or midline shift.   ORBITS: No acute abnormality.   SINUSES: No acute abnormality.   SOFT TISSUES AND SKULL: No acute soft tissue abnormality. No skull fracture.   IMPRESSION: 1. No acute intracranial abnormality.  COGNITION: Overall cognitive status: Impaired: Areas of impairment:  Memory: Impaired: Working It Consultant function: Impaired: Slow processing   Functional deficits: Following along in conversations, difficulties with remembering names and important information from conversations.  COGNITIVE COMMUNICATION: Following directions: Follows multi-step commands with increased time  Auditory comprehension: Impaired: Pt requires extra time and repetitions for complex auditory comprehension. Verbal expression: WFL Functional communication: WFL   STANDARDIZED ASSESSMENTS: SLUMS: 26/30  PATIENT REPORTED OUTCOME MEASURES (PROM): Mental Fatigue Scale: 13.5 - She rated a 2 fairly serious problem requiring a full nights sleep after working to her limit, having difficulty concentrating and forming thoughts, being more tearful than usual, an sleeping less than usual. She rated a 1 or a slight problem with reduced cognition in morning and night and better in afternoon and  evening, and forgetting things more often than she used to.                                                                                                                             TREATMENT DATE:  02/17/24: Pt notes that she is still having difficulties with losing her train of thought during conversations. Pt presented with increased fatigue during session. She states that her energy has been lower ever since she has stopped eating as much due to current esophageal difficulties. SLP counseled pt about impact of low nutrition on cognitive health and that further progression with ST will be limited until nutrition is optimized for pt comfort. Pt is agreeable to pursuing d/c from ST today due to meeting most of her goals and to focus on current esophageal difficulties. Pt is ready for d/c.   02/10/24: Pt continues to be successful at work regarding her cognition. Pt notes that things  at home have been a little better with regard to distractions. She continues to forget things during conversations. Pt feels that she loses her train of thought. SLP instructed pt in integrating external memory supports (notepad) during conversation to support short-term memory recall. SLP also instructed pt in taking an intentional pause during short-term memory lapses in conversation to promote communication repair and reduce frustration. Pt's cough has improved a little; pt is reducing her throat clearing at home. Pt continues to experience a globus sensation in her chest region. Pt utilized cough suppression strategies to suppress one cough successfully during session given rare min A. Plan is to continue cognitive-communication strategies for optimizing pt's short-term memory during conversations.   02/03/24: She continues to report success at work - she is not making mistakes at work due to cognition - she continues to report with divided attention and some mistakes outside of work due to getting distractions.  Education re: attention vs memory - generated strategies to support attention - including self advocating, educating her family - and limiting interruptions when she is working on a task. Vanessa Butler has implemented cough suppression strategies of abdominal sniff and sh exhale, taking sips and doing hard swallows. She reports coughing has been frequent, but she is able to control cough with breathing. Today, noted multiple throat clearing - she reports globus in throat and sternum. She required frequent mod verbal cues to be aware of throat clearing behavior- trained in throat clear alternatives including sips,hard swallows and rescue breathing. She identifies scents as cough trigger as well. She also had coughing episode during emotional conversation today, indicating possible stress trigger as well. Vanessa Butler demonstrated cough suppression 3x today with usual mod modeling, instruction and verbal cues to not talk until sensation has passed.  Goals added  February 28, 2024: Pt has returned to work. Pt is readjusting to her job; however, she feels that she is adjusting well with no significant difficulties noted. She states that she was placed in a work station that has her computer on the left side, where she is used to it on the right side. SLP educated pt in advocating for a right side computer to support job readjustment for her cognition. She notes that she has spoken with her higher-ups and is waiting for a spot to become available. Pt notes that she has had a persistent cough and a feeling of a lump in her throat. SLP had pt complete Reflux Symptom Index (RSI) to analyze pt's current symptoms. Pt scored 39/45 on RSI. SLP instructed pt in cough suppression strategies (Rescue Breathing - Blow out on a /shhhhh/, followed by deep breath) to minimize persistent coughing for QOL. Pt successfully demonstrated the strategy during 2 coughing episodes. Pt is agreeable to using strategy at home to reduce incidence of  coughing. Plan is to continue cognitive-communication support building and review cough strategies to maximize pt QOL and safety during ADLs.  01/25/24: Pt plans to return to work on Wednesday. SLP collaborated with pt to determine work requirements. SLP utilize role-playing as a strategy for pt to interact with functional task related to work responsibilities. Pt displayed 100% success with performing functional task (comprehending, organizing, and identifying errors related to how many items in a order) independently. SLP introduced external memory strategies (using sticky notes) to improve short-term memory at work. SLP recommended pt to use sticky notes to keep tally of the amount of items in an order to reduce likelihood of mistake or errors. SLP reviewed energy conservation strategies (resting after  getting home from work for the first two weeks) to optimize pt's cognitive health. Pt is making progress with cognitive-communication goals. Plan is to discuss current energy requirements at work due to pt returning to work this week.   01/13/24: Targeted training compensatory strategies for processing in conversations - including limiting back ground noises and distractions, repeating back what you have heard, talking on the phone seated in a quiet place. See Patient Instructions. Targeted awareness and compensations for cognitive impairments for return to work including using a chair, having a co-worker buddy to alert her to any mistakes or balance concerns, taking breaks in quiet place with closed eyes and not planning long or harder tasks after work.   01/06/24: Evaluation completed. No tx completed this date.    PATIENT EDUCATION: Education details: Evaluation results.  Person educated: Patient Education method: Explanation Education comprehension: verbalized understanding and needs further education   GOALS: Goals reviewed with patient? Yes  SHORT TERM GOALS: Target date:  01/25/24  Pt will describe and utilize at least 2 internal/external memory strategies to optimize safety and efficiency during structured, functional tasks related to work given occasional min A. Baseline: Goal status: MET  2.  Pt and pt's family will successfully utilize repair strategies during moments of communication breakdown to maximize pt's communicative effectiveness in 80% of opportunities given occasional min A. Baseline:  Goal status: MET  3.  Pt will demonstrate successful thought organization for communication as evident by <3 pauses during moderately complex conversation given occasional min A. Baseline:  Goal status: MET  4.  Pt will develop and perform appropriate sequencing tasks related to work procedures (using external memory strategies) with 100% consistency given occasional min A. Baseline:  Goal status: DEFERRED  5.   Pt will complete PROM for post-concussion symptoms.  Baseline:  Goal status: MET  6.  TBD. Baseline:  Goal status: ONGOING  LONG TERM GOALS: Target date: 02/17/24  Pt will utilize memory strategies independently for optimizing communication effectiveness and returning to work. Baseline:  Goal status: DEFERRED/NOT MET  2.  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  Baseline:  Goal status: MET  3.  Pt and pt's family will utilize communication support strategies independently to maximize pt's complex auditory comprehension and thought organization during moderately complex conversation. Baseline:  Goal status: DEFERRED  4.  Pt will demonstrate anticipatory awareness of potential problems she may encounter at work due to cognitive changes Baseline:  Goal status: MET  5.  Pt will utilize cough suppression strategies as needed with occasional min A over 2 sessions Baseline:  Goal status: MET  6.  Pt will avoid throat clearing behavior to have 2 or less throat  clears a session with occasional min A Baseline:  Goal status: MET  ASSESSMENT:  CLINICAL IMPRESSION: Patient is a 49 y.o. female who was seen today for cognitive evaluation s/p concussion. Pt presents with mild-moderate cognitive-communication deficits primarily characterized by short-term memory impairment and executive dysfunction due to slow processing. Pt exhibited many moments where she required multiple repetitions and extra time for processing to understand assessment tasks. According to pt's recent hx, she experiences difficulties with her cognition during conversations with family and performing daily tasks requiring short-term memory skills. ST is recommended to optimizing pt's cognitive-communication skills for returning to work and effectively communicating during ADLs. Without ST, pt is at risk of low job performance and lower QOL due to social isolation as a result of  cognitive-communication deficits.  OBJECTIVE IMPAIRMENTS: include memory and executive functioning. These impairments are limiting patient from return to work, ADLs/IADLs, and effectively communicating at home and in community. Factors affecting potential to achieve goals and functional outcome are N/A. Patient will benefit from skilled SLP services to address above impairments and improve overall function.  REHAB POTENTIAL: Excellent  PLAN:  SLP FREQUENCY: 2x/week  SLP DURATION: 6 weeks  PLANNED INTERVENTIONS: Environmental controls, Cognitive reorganization, Internal/external aids, Functional tasks, SLP instruction and feedback, Compensatory strategies, Patient/family education, and [id number] Treatment of speech (30 or 45 min)   SPEECH THERAPY DISCHARGE SUMMARY   Visits from Start of Care: 7   Current functional level related to goals / functional outcomes: See goals above   Remaining deficits: Mild cognitive-communication deficits related to thought organization during conversation and recent low energy  (potentially due to low nutrition from esophageal difficulties)     Education / Equipment: Continued use of cognitive-communication strategies (external memory supports and attention supports) QOL.   Patient agrees to discharge. Patient goals were partially met. Patient is being discharged due to goals being met and patient satisfaction with current status. Pt also wants to focus on other health-related goals to optimize her QOL.     Waddell Music, M.A., CF-SLP 02/17/2024, 4:13 PM      "

## 2024-02-22 ENCOUNTER — Ambulatory Visit: Payer: Worker's Compensation

## 2024-02-22 ENCOUNTER — Encounter (HOSPITAL_COMMUNITY): Admission: RE | Payer: Self-pay | Source: Home / Self Care

## 2024-02-22 ENCOUNTER — Ambulatory Visit (HOSPITAL_COMMUNITY): Admission: RE | Admit: 2024-02-22 | Payer: MEDICAID | Admitting: Gastroenterology

## 2024-02-24 ENCOUNTER — Ambulatory Visit: Payer: Worker's Compensation

## 2024-02-24 ENCOUNTER — Ambulatory Visit: Payer: MEDICAID | Admitting: Physical Therapy

## 2024-02-29 ENCOUNTER — Ambulatory Visit: Payer: Worker's Compensation

## 2024-03-02 ENCOUNTER — Ambulatory Visit: Payer: MEDICAID | Admitting: Family Medicine

## 2024-03-02 ENCOUNTER — Ambulatory Visit: Payer: Worker's Compensation
